# Patient Record
Sex: Male | Born: 1975
Health system: Southern US, Community
[De-identification: ages and names within clinical notes are randomized; demographics above are authoritative.]

## PROBLEM LIST (undated history)

## (undated) DIAGNOSIS — J45909 Unspecified asthma, uncomplicated: Secondary | ICD-10-CM

## (undated) DIAGNOSIS — I700 Atherosclerosis of aorta: Secondary | ICD-10-CM

## (undated) DIAGNOSIS — K760 Fatty (change of) liver, not elsewhere classified: Secondary | ICD-10-CM

## (undated) DIAGNOSIS — I428 Other cardiomyopathies: Secondary | ICD-10-CM

## (undated) DIAGNOSIS — I10 Essential (primary) hypertension: Secondary | ICD-10-CM

## (undated) DIAGNOSIS — I5042 Chronic combined systolic (congestive) and diastolic (congestive) heart failure: Secondary | ICD-10-CM

## (undated) DIAGNOSIS — IMO0001 Reserved for inherently not codable concepts without codable children: Secondary | ICD-10-CM

## (undated) DIAGNOSIS — Z720 Tobacco use: Secondary | ICD-10-CM

---

## 1998-03-16 ENCOUNTER — Emergency Department (HOSPITAL_COMMUNITY): Admission: EM | Admit: 1998-03-16 | Discharge: 1998-03-16 | Payer: Self-pay | Admitting: Emergency Medicine

## 2005-11-16 ENCOUNTER — Encounter: Payer: Self-pay | Admitting: Emergency Medicine

## 2007-08-06 ENCOUNTER — Emergency Department: Payer: Self-pay | Admitting: Emergency Medicine

## 2008-05-02 ENCOUNTER — Emergency Department: Payer: Self-pay | Admitting: Internal Medicine

## 2014-10-08 ENCOUNTER — Emergency Department (HOSPITAL_COMMUNITY)
Admission: EM | Admit: 2014-10-08 | Discharge: 2014-10-08 | Disposition: A | Discharge status: Home / Self Care | Payer: Self-pay | Attending: Emergency Medicine | Admitting: Emergency Medicine

## 2014-10-08 ENCOUNTER — Encounter (HOSPITAL_COMMUNITY): Payer: Self-pay | Admitting: Physical Medicine and Rehabilitation

## 2014-10-08 ENCOUNTER — Emergency Department (HOSPITAL_COMMUNITY): Payer: Self-pay

## 2014-10-08 DIAGNOSIS — S92301A Fracture of unspecified metatarsal bone(s), right foot, initial encounter for closed fracture: Secondary | ICD-10-CM

## 2014-10-08 DIAGNOSIS — Y998 Other external cause status: Secondary | ICD-10-CM | POA: Insufficient documentation

## 2014-10-08 DIAGNOSIS — Y9367 Activity, basketball: Secondary | ICD-10-CM | POA: Insufficient documentation

## 2014-10-08 DIAGNOSIS — S92354A Nondisplaced fracture of fifth metatarsal bone, right foot, initial encounter for closed fracture: Secondary | ICD-10-CM | POA: Insufficient documentation

## 2014-10-08 DIAGNOSIS — X58XXXA Exposure to other specified factors, initial encounter: Secondary | ICD-10-CM | POA: Insufficient documentation

## 2014-10-08 DIAGNOSIS — S9031XA Contusion of right foot, initial encounter: Secondary | ICD-10-CM | POA: Insufficient documentation

## 2014-10-08 DIAGNOSIS — Y92310 Basketball court as the place of occurrence of the external cause: Secondary | ICD-10-CM | POA: Insufficient documentation

## 2014-10-08 DIAGNOSIS — Z720 Tobacco use: Secondary | ICD-10-CM | POA: Insufficient documentation

## 2014-10-08 MED ORDER — NAPROXEN 500 MG PO TABS: 500.0000 mg | ORAL_TABLET | Freq: Two times a day (BID) | ORAL | Status: DC

## 2014-10-08 MED ORDER — TRAMADOL HCL 50 MG PO TABS: 50.0000 mg | ORAL_TABLET | Freq: Four times a day (QID) | ORAL | Status: DC | PRN

## 2014-10-08 MED ORDER — HYDROCODONE-ACETAMINOPHEN 5-325 MG PO TABS
1.0000 | ORAL_TABLET | Freq: Once | ORAL | Status: AC
  Administered 2014-10-08: 1 via ORAL
  Filled 2014-10-08: qty 1

## 2014-10-08 MED ORDER — ONDANSETRON 4 MG PO TBDP
4.0000 mg | ORAL_TABLET | Freq: Once | ORAL | Status: AC
  Administered 2014-10-08: 4 mg via ORAL
  Filled 2014-10-08: qty 1

## 2014-10-08 NOTE — Progress Notes (Signed)
Orthopedic Tech Progress Note Patient Details:  Jesus Fuller 1976/03/01 [id number]  Ortho Devices Type of Ortho Device: Ace wrap, Crutches, Post (short leg) splint Ortho Device/Splint Location: rle Ortho Device/Splint Interventions: Application   [name] 10/08/2014, 12:01 PM

## 2014-10-08 NOTE — ED Notes (Signed)
Pt is in stable condition upon d/c and is escorted from ED by staff via wheelchair. 

## 2014-10-08 NOTE — ED Notes (Signed)
Pt presents to department for evaluation of R foot pain. Reports he twisted foot last night playing basketball. 9/10 pain upon arrival to ED.

## 2014-10-08 NOTE — ED Notes (Signed)
Paged ortho 

## 2014-10-08 NOTE — ED Provider Notes (Signed)
CSN: [id number]     Arrival date & time 10/08/14  1012 History  This chart was scribed for non-physician practitioner, Arthor Captain, PA-C, working with Mancel Bale, MD by Charline Bills, ED Scribe. This patient was seen in room TR08C/TR08C and the patient's care was started at 11:15 AM.   Chief Complaint  Patient presents with  . Foot Pain   The history is provided by the patient. No language interpreter was used.   HPI Comments: Jesus Fuller is a 39 y.o. male who presents to the Emergency Department complaining of constant R foot pain since 7 PM last night. Pt states that he twisted his foot while playing basketball last night with his son. Pain is exacerbated with bearing weight and movement. Pt was able to ambulate a few minutes after the injury with pain. He has been treating with Advil, ice and elevation.   History reviewed. No pertinent past medical history. History reviewed. No pertinent past surgical history. No family history on file. History  Substance Use Topics  . Smoking status: Current Every Day Smoker  . Smokeless tobacco: Not on file  . Alcohol Use: Yes    Review of Systems  Musculoskeletal: Positive for joint swelling and arthralgias.   Allergies  Review of patient's allergies indicates no known allergies.  Home Medications   Prior to Admission medications   Not on File   BP 115/71 mmHg  Pulse 73  Temp(Src) 98.7 F (37.1 C) (Oral)  Resp 18  SpO2 99% Physical Exam  Constitutional: He is oriented to person, place, and time. He appears well-developed and well-nourished. No distress.  HENT:  Head: Normocephalic and atraumatic.  Eyes: Conjunctivae and EOM are normal.  Neck: Neck supple. No tracheal deviation present.  Cardiovascular: Normal rate.   Pulmonary/Chest: Effort normal. No respiratory distress.  Musculoskeletal: Normal range of motion.  No malleolar tenderness. Significant swelling over the proximal and lateral portion of the forefoot.  Signifcant swelling over the fifth metatarsal head. Mild bruising and a blister noted.  Neurological: He is alert and oriented to person, place, and time.  Skin: Skin is warm and dry.  Psychiatric: He has a normal mood and affect. His behavior is normal.  Nursing note and vitals reviewed.  ED Course  Procedures (including critical care time) DIAGNOSTIC STUDIES: Oxygen Saturation is 99% on RA, normal by my interpretation.    COORDINATION OF CARE: 11:20 AM-Discussed treatment plan which includes XR, Vicodin, Zofran and crutches with pt at bedside and pt agreed to plan.   Labs Review Labs Reviewed - No data to display  Imaging Review Dg Foot Complete Right  10/08/2014   CLINICAL DATA:  Patient converted his right foot playing basketball last evening. Right foot lateral pain and swelling.  EXAM: RIGHT FOOT COMPLETE - 3+ VIEW  COMPARISON:  None.  FINDINGS: There is a nondisplaced fracture across the base of the fifth metatarsal. This intersects the cuboid, fifth metatarsal articulation. There is no fracture comminution.  No other fractures. Joints are normally spaced and aligned. Mild lateral soft tissue swelling.  IMPRESSION: Nondisplaced fracture of the base of the right fifth metatarsal as detailed.   Electronically Signed   By: Amie Portland M.D.   On: 10/08/2014 11:24    EKG Interpretation None      SPLINT APPLICATION Date/Time: 1:52 PM Authorized by: Arthor Captain Consent: Verbal consent obtained. Risks and benefits: risks, benefits and alternatives were discussed Consent given by: patient Splint applied by: orthopedic technician Location details: R leg  Splint type: posterior Post-procedure: The splinted body part was neurovascularly unchanged following the procedure. Patient tolerance: Patient tolerated the procedure well with no immediate complications.    MDM   Final diagnoses:  Fracture of fifth metatarsal bone, right, closed, initial encounter    Patient with  proximal 5ht metatarsal fracture . I have placed the patient in a posterior short leg splint. He is to remain nwb until ortho follow up is established. Supportive care RICE  I personally performed the services described in this documentation, which was scribed in my presence. The recorded information has been reviewed and is accurate.      Arthor Captain, PA-C 10/13/14 1354  Mancel Bale, MD 10/16/14 [phone]

## 2014-10-08 NOTE — ED Notes (Signed)
Ortho at bedside.

## 2014-10-08 NOTE — Discharge Instructions (Signed)
Do not bear weight on the foot. Please call to make an appointment for follow up with the orthopedist.  Metatarsal Fracture, Undisplaced A metatarsal fracture is a break in the bone(s) of the foot. These are the bones of the foot that connect your toes to the bones of the ankle. DIAGNOSIS  The diagnoses of these fractures are usually made with X-rays. If there are problems in the forefoot and x-rays are normal a later bone scan will usually make the diagnosis.  TREATMENT AND HOME CARE INSTRUCTIONS  Treatment may or may not include a cast or walking shoe. When casts are needed the use is usually for short periods of time so as not to slow down healing with muscle wasting (atrophy).  Activities should be stopped until further advised by your caregiver.  Wear shoes with adequate shock absorbing capabilities and stiff soles.  Alternative exercise may be undertaken while waiting for healing. These may include bicycling and swimming, or as your caregiver suggests.  It is important to keep all follow-up visits or specialty referrals. The failure to keep these appointments could result in improper bone healing and chronic pain or disability.  Warning: Do not drive a car or operate a motor vehicle until your caregiver specifically tells you it is safe to do so. IF YOU DO NOT HAVE A CAST OR SPLINT:  You may walk on your injured foot as tolerated or advised.  Do not put any weight on your injured foot for as long as directed by your caregiver. Slowly increase the amount of time you walk on the foot as the pain allows or as advised.  Use crutches until you can bear weight without pain. A gradual increase in weight bearing may help.  Apply ice to the injury for 15-20 minutes each hour while awake for the first 2 days. Put the ice in a plastic bag and place a towel between the bag of ice and your skin.  Only take over-the-counter or prescription medicines for pain, discomfort, or fever as directed by  your caregiver. SEEK IMMEDIATE MEDICAL CARE IF:   Your cast gets damaged or breaks.  You have continued severe pain or more swelling than you did before the cast was put on, or the pain is not controlled with medications.  Your skin or nails below the injury turn blue or grey, or feel cold or numb.  There is a bad smell, or new stains or pus-like (purulent) drainage coming from the cast. MAKE SURE YOU:   Understand these instructions.  Will watch your condition.  Will get help right away if you are not doing well or get worse. Document Released: 03/26/2002 Document Revised: 09/26/2011 Document Reviewed: 02/15/2008 [facility] Patient Information 2015 [place], [place]. This information is not intended to replace advice given to you by your health care provider. Make sure you discuss any questions you have with your health care provider.

## 2017-10-18 ENCOUNTER — Encounter: Payer: Self-pay | Admitting: Emergency Medicine

## 2017-10-18 ENCOUNTER — Emergency Department: Payer: Self-pay

## 2017-10-18 ENCOUNTER — Other Ambulatory Visit: Payer: Self-pay

## 2017-10-18 ENCOUNTER — Emergency Department
Admission: EM | Admit: 2017-10-18 | Discharge: 2017-10-18 | Disposition: A | Discharge status: Home / Self Care | Payer: Self-pay | Attending: Emergency Medicine | Admitting: Emergency Medicine

## 2017-10-18 DIAGNOSIS — Z23 Encounter for immunization: Secondary | ICD-10-CM | POA: Insufficient documentation

## 2017-10-18 DIAGNOSIS — T24021A Burn of unspecified degree of right knee, initial encounter: Secondary | ICD-10-CM | POA: Insufficient documentation

## 2017-10-18 DIAGNOSIS — S81011A Laceration without foreign body, right knee, initial encounter: Secondary | ICD-10-CM | POA: Insufficient documentation

## 2017-10-18 DIAGNOSIS — X58XXXA Exposure to other specified factors, initial encounter: Secondary | ICD-10-CM | POA: Insufficient documentation

## 2017-10-18 DIAGNOSIS — Y999 Unspecified external cause status: Secondary | ICD-10-CM | POA: Insufficient documentation

## 2017-10-18 DIAGNOSIS — Z79899 Other long term (current) drug therapy: Secondary | ICD-10-CM | POA: Insufficient documentation

## 2017-10-18 DIAGNOSIS — Y939 Activity, unspecified: Secondary | ICD-10-CM | POA: Insufficient documentation

## 2017-10-18 DIAGNOSIS — Y929 Unspecified place or not applicable: Secondary | ICD-10-CM | POA: Insufficient documentation

## 2017-10-18 DIAGNOSIS — F17210 Nicotine dependence, cigarettes, uncomplicated: Secondary | ICD-10-CM | POA: Insufficient documentation

## 2017-10-18 DIAGNOSIS — W268XXA Contact with other sharp object(s), not elsewhere classified, initial encounter: Secondary | ICD-10-CM | POA: Insufficient documentation

## 2017-10-18 MED ORDER — TETANUS-DIPHTH-ACELL PERTUSSIS 5-2.5-18.5 LF-MCG/0.5 IM SUSP
0.5000 mL | Freq: Once | INTRAMUSCULAR | Status: AC
  Administered 2017-10-18: 0.5 mL via INTRAMUSCULAR
  Filled 2017-10-18: qty 0.5

## 2017-10-18 MED ORDER — CEPHALEXIN 500 MG PO CAPS: 500.0000 mg | ORAL_CAPSULE | Freq: Four times a day (QID) | ORAL | 0 refills | Status: AC

## 2017-10-18 MED ORDER — LIDOCAINE HCL (PF) 1 % IJ SOLN
5.0000 mL | Freq: Once | INTRAMUSCULAR | Status: AC
  Administered 2017-10-18: 5 mL via INTRADERMAL
  Filled 2017-10-18: qty 5

## 2017-10-18 MED ORDER — LIDOCAINE-EPINEPHRINE-TETRACAINE (LET) SOLUTION
NASAL | Status: AC
  Filled 2017-10-18: qty 3

## 2017-10-18 MED ORDER — LIDOCAINE-EPINEPHRINE-TETRACAINE (LET) SOLUTION
3.0000 mL | Freq: Once | NASAL | Status: AC
  Administered 2017-10-18: 15:00:00 3 mL via TOPICAL
  Filled 2017-10-18: qty 3

## 2017-10-18 MED ORDER — IBUPROFEN 600 MG PO TABS: 600.0000 mg | ORAL_TABLET | Freq: Four times a day (QID) | ORAL | 0 refills | Status: DC | PRN

## 2017-10-18 MED ORDER — OXYCODONE-ACETAMINOPHEN 5-325 MG PO TABS: 1.0000 | ORAL_TABLET | ORAL | 0 refills | Status: DC | PRN

## 2017-10-18 MED ORDER — LIDOCAINE HCL (PF) 1 % IJ SOLN
INTRAMUSCULAR | Status: AC
  Filled 2017-10-18: qty 5
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
## 2017-10-18 NOTE — ED Notes (Signed)
Wound dressed by rn

## 2017-11-02 ENCOUNTER — Other Ambulatory Visit: Payer: Self-pay

## 2017-11-02 ENCOUNTER — Emergency Department
Admission: EM | Admit: 2017-11-02 | Discharge: 2017-11-02 | Disposition: A | Discharge status: Home / Self Care | Payer: Self-pay | Attending: Emergency Medicine | Admitting: Emergency Medicine

## 2017-11-02 DIAGNOSIS — F17200 Nicotine dependence, unspecified, uncomplicated: Secondary | ICD-10-CM | POA: Insufficient documentation

## 2017-11-02 DIAGNOSIS — X58XXXD Exposure to other specified factors, subsequent encounter: Secondary | ICD-10-CM | POA: Insufficient documentation

## 2017-11-02 DIAGNOSIS — S8991XD Unspecified injury of right lower leg, subsequent encounter: Secondary | ICD-10-CM | POA: Insufficient documentation

## 2017-11-02 DIAGNOSIS — Z4802 Encounter for removal of sutures: Secondary | ICD-10-CM | POA: Insufficient documentation

## 2017-11-02 NOTE — Discharge Instructions (Signed)
Clean area twice a day with mild soap and water.  Keep area clean and dry.  Do not apply any gel or ointment to the area.  Follow-up with [facility] acute care if any continued problems.

## 2017-11-02 NOTE — ED Provider Notes (Signed)
[facility] Emergency Department Provider Note  ____________________________________________   First MD Initiated Contact with Patient 11/02/17 1218     (approximate)  I have reviewed the triage vital signs and the nursing notes.   HISTORY  Chief Complaint Suture / Staple Removal  HPI Jesus Fuller is a 42 y.o. male is here for suture removal.  Patient was seen in the ED 15 days ago at which time his knee was sutured.  Patient states he was not told when to come back.  He has been using Neosporin to the area.  He is unaware of any fever or chills.  He has had no drainage.  History reviewed. No pertinent past medical history.  There are no active problems to display for this patient.   History reviewed. No pertinent surgical history.  Prior to Admission medications   Not on File    Allergies Patient has no known allergies.  History reviewed. No pertinent family history.  Social History Social History   Tobacco Use  . Smoking status: Current Every Day Smoker  . Smokeless tobacco: Never Used  Substance Use Topics  . Alcohol use: Yes  . Drug use: No    Review of Systems Constitutional: No fever/chills Cardiovascular: Denies chest pain. Respiratory: Denies shortness of breath. Skin: Healing laceration. Neurological: Negative for  focal weakness or numbness. ___________________________________________   PHYSICAL EXAM:  VITAL SIGNS: ED Triage Vitals  Enc Vitals Group     BP      Pulse      Resp      Temp      Temp src      SpO2      Weight      Height      Head Circumference      Peak Flow      Pain Score      Pain Loc      Pain Edu?      Excl. in GC?    Constitutional: Alert and oriented. Well appearing and in no acute distress. Eyes: Conjunctivae are normal.  Head: Atraumatic. Neck: No stridor.   Cardiovascular: Normal rate, regular rhythm. Grossly normal heart sounds.  Good peripheral circulation. Respiratory:  Normal respiratory effort.  No retractions. Lungs CTAB. Musculoskeletal: Right knee exam is negative for gross deformity.  There is a well-healed sutured area without any drainage. Neurologic:  Normal speech and language. No gross focal neurologic deficits are appreciated. No gait instability. Skin:  Skin is warm, dry and intact.  As above. Psychiatric: Mood and affect are normal. Speech and behavior are normal.  ____________________________________________   LABS (all labs ordered are listed, but only abnormal results are displayed)  Labs Reviewed - No data to display  PROCEDURES  Procedure(s) performed: None  Procedures  Critical Care performed: No  ____________________________________________   INITIAL IMPRESSION / ASSESSMENT AND PLAN / ED COURSE  As part of my medical decision making, I reviewed the following data within the electronic MEDICAL RECORD NUMBER Notes from prior ED visits and [place] Controlled Substance Database  Sutures were removed.  Patient was told to keep area clean and dry and watch for any signs of infection.  He is to follow-up with his PCP or [facility] acute care if any continued problems.  ____________________________________________   FINAL CLINICAL IMPRESSION(S) / ED DIAGNOSES  Final diagnoses:  Encounter for removal of sutures     ED Discharge Orders    None       Note:  This document was prepared using Dragon voice recognition software and may include unintentional dictation errors.    Tommi Rumps, PA-C 11/02/17 1504    Emily Filbert, MD 11/02/17 [phone]
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
## 2017-11-02 NOTE — ED Notes (Signed)
7 sutures removed from right leg.

## 2018-10-01 ENCOUNTER — Emergency Department (HOSPITAL_COMMUNITY): Payer: Self-pay

## 2018-10-01 ENCOUNTER — Encounter (HOSPITAL_COMMUNITY): Payer: Self-pay

## 2018-10-01 ENCOUNTER — Other Ambulatory Visit: Payer: Self-pay

## 2018-10-01 ENCOUNTER — Emergency Department (HOSPITAL_COMMUNITY)
Admission: EM | Admit: 2018-10-01 | Discharge: 2018-10-01 | Disposition: A | Discharge status: Home / Self Care | Payer: Self-pay | Attending: Emergency Medicine | Admitting: Emergency Medicine

## 2018-10-01 DIAGNOSIS — F17210 Nicotine dependence, cigarettes, uncomplicated: Secondary | ICD-10-CM | POA: Insufficient documentation

## 2018-10-01 DIAGNOSIS — J45909 Unspecified asthma, uncomplicated: Secondary | ICD-10-CM | POA: Insufficient documentation

## 2018-10-01 DIAGNOSIS — M545 Low back pain, unspecified: Secondary | ICD-10-CM

## 2018-10-01 HISTORY — DX: Unspecified asthma, uncomplicated: J45.909

## 2018-10-01 MED ORDER — KETOROLAC TROMETHAMINE 15 MG/ML IJ SOLN
15.0000 mg | Freq: Once | INTRAMUSCULAR | Status: AC
  Administered 2018-10-01: 15 mg via INTRAMUSCULAR
  Filled 2018-10-01: qty 1

## 2018-10-01 MED ORDER — CYCLOBENZAPRINE HCL 10 MG PO TABS
10.0000 mg | ORAL_TABLET | Freq: Once | ORAL | Status: AC
  Administered 2018-10-01: 10 mg via ORAL
  Filled 2018-10-01: qty 1

## 2018-10-01 MED ORDER — NAPROXEN 500 MG PO TABS: 500.0000 mg | ORAL_TABLET | Freq: Two times a day (BID) | ORAL | 0 refills | Status: DC

## 2018-10-01 MED ORDER — CYCLOBENZAPRINE HCL 10 MG PO TABS: 10.0000 mg | ORAL_TABLET | Freq: Two times a day (BID) | ORAL | 0 refills | Status: DC | PRN

## 2018-10-01 NOTE — ED Provider Notes (Signed)
[facility]-EMERGENCY DEPT Provider Note   CSN: [id number] Arrival date & time: 10/01/18  3976    History   Chief Complaint Chief Complaint  Patient presents with  . Back Pain    HPI Jesus Fuller is a 43 y.o. male.     Patient is a 43 year old male with past medical history of asthma presenting to the emergency department for lower back pain.  Patient reports that this past Friday he was lifting heavy things at work and began to feel the back pain while he was lifting.  Reports that the pain gradually got worse over the next couple of days.  Reports that the pain is in the left lower back and radiates to the upper lateral left thigh.  Denies any radiation further into the legs.  Pain is worse with prolonged standing, walking and turning. Denies any numbness, tingling, weakness, saddle anesthesia, fever, loss of control of bowel or bladder.  He has not tried anything for relief.  He has had a history of some back spasms and pulled muscles in the past and feels like this is similar.     Past Medical History:  Diagnosis Date  . Asthma     There are no active problems to display for this patient.   History reviewed. No pertinent surgical history.      Home Medications    Prior to Admission medications   Medication Sig Start Date End Date Taking? Authorizing Provider  cyclobenzaprine (FLEXERIL) 10 MG tablet Take 1 tablet (10 mg total) by mouth 2 (two) times daily as needed for muscle spasms. 10/01/18   Arlyn Dunning, PA-C  naproxen (NAPROSYN) 500 MG tablet Take 1 tablet (500 mg total) by mouth 2 (two) times daily. 10/01/18   Arlyn Dunning, PA-C    Family History Family History  Problem Relation Age of Onset  . Cancer Mother   . COPD Mother   . Diabetes Father     Social History Social History   Tobacco Use  . Smoking status: Current Every Day Smoker    Packs/day: 1.50    Types: Cigarettes  . Smokeless tobacco: Never Used  Substance Use  Topics  . Alcohol use: Yes  . Drug use: No     Allergies   Patient has no known allergies.   Review of Systems Review of Systems  Constitutional: Negative for chills and fever.  HENT: Negative for ear pain and sore throat.   Eyes: Negative for pain and visual disturbance.  Respiratory: Negative for cough and shortness of breath.   Cardiovascular: Negative for chest pain and palpitations.  Gastrointestinal: Negative for abdominal pain and vomiting.  Genitourinary: Negative for difficulty urinating, dysuria and hematuria.  Musculoskeletal: Positive for arthralgias and myalgias. Negative for back pain, gait problem, joint swelling, neck pain and neck stiffness.  Skin: Negative for color change and rash.  Neurological: Negative for dizziness, seizures, syncope and numbness.  All other systems reviewed and are negative.    Physical Exam Updated Vital Signs BP (!) 162/113 (BP Location: Left Arm)   Pulse 78   Temp 98.4 F (36.9 C)   Resp 17   Ht 5\' 9"  (1.753 m)   Wt 88.5 kg   SpO2 98%   BMI 28.80 kg/m   Physical Exam Vitals signs and nursing note reviewed.  Constitutional:      Appearance: Normal appearance.  HENT:     Head: Normocephalic.     Nose: Nose normal. No congestion or  rhinorrhea.     Mouth/Throat:     Mouth: Mucous membranes are moist.  Eyes:     Conjunctiva/sclera: Conjunctivae normal.  Pulmonary:     Effort: Pulmonary effort is normal.  Musculoskeletal:     Thoracic back: Normal.     Lumbar back: He exhibits tenderness, pain and spasm. He exhibits normal range of motion, no bony tenderness, no swelling, no deformity and no laceration.       Back:     Left upper leg: Normal.  Skin:    General: Skin is dry.  Neurological:     General: No focal deficit present.     Mental Status: He is alert.     Cranial Nerves: No cranial nerve deficit.     Motor: No weakness.     Gait: Gait abnormal (stiff).  Psychiatric:        Mood and Affect: Mood normal.       ED Treatments / Results  Labs (all labs ordered are listed, but only abnormal results are displayed) Labs Reviewed - No data to display  EKG None  Radiology Dg Lumbar Spine 2-3 Views  Result Date: 10/01/2018 CLINICAL DATA:  Left-sided lower back pain EXAM: LUMBAR SPINE - 2-3 VIEW COMPARISON:  02/20/2016 FINDINGS: No fracture or dislocation of the lumbar spine. There is mild disc space height loss and osteophytosis at L1-L2, L4-L5 and L5-S1. Mild multilevel facet degenerative change. Findings are not significantly changed compared to prior examination dated 02/20/2016. Lumbar disc and neural foraminal pathology may be further evaluated by MRI if indicated by localizing signs and symptoms. IMPRESSION: No fracture or dislocation of the lumbar spine. There is mild disc space height loss and osteophytosis at L1-L2, L4-L5 and L5-S1. Mild multilevel facet degenerative change. Findings are not significantly changed compared to prior examination dated 02/20/2016. Lumbar disc and neural foraminal pathology may be further evaluated by MRI if indicated by localizing signs and symptoms. Electronically Signed   By: Lauralyn Primes M.D.   On: 10/01/2018 09:29    Procedures Procedures (including critical care time)  Medications Ordered in ED Medications  cyclobenzaprine (FLEXERIL) tablet 10 mg (10 mg Oral Given 10/01/18 0903)  ketorolac (TORADOL) 15 MG/ML injection 15 mg (15 mg Intramuscular Given 10/01/18 0904)     Initial Impression / Assessment and Plan / ED Course  I have reviewed the triage vital signs and the nursing notes.  Pertinent labs & imaging results that were available during my care of the patient were reviewed by me and considered in my medical decision making (see chart for details).  Clinical Course as of Sep 30 956  Mon Oct 01, 2018  6568 Patient was evaluated for back pain today. Patient has no concerning symptoms or physical exam findings including no fever, no loss of control  of bowel or bladder, no urinary retention, no saddle anesthesia, no leg weakness and no pain radiation into the legs. She was given medication to treat her symptoms and advised to f/u with PMD for further workup including possible PT, medication change, further imaging, etc. She was advised on all concerning symptoms above and to return to the ED if any of them arise.       [KM]    Clinical Course User Index [KM] Arlyn Dunning, PA-C         Final Clinical Impressions(s) / ED Diagnoses   Final diagnoses:  Acute left-sided low back pain without sciatica    ED Discharge Orders  Ordered    cyclobenzaprine (FLEXERIL) 10 MG tablet  2 times daily PRN     10/01/18 0957    naproxen (NAPROSYN) 500 MG tablet  2 times daily     10/01/18 0957           Arlyn Dunning, PA-C 10/01/18 6301    Derwood Kaplan, MD 10/03/18 1216

## 2018-10-01 NOTE — ED Triage Notes (Signed)
Patient reports left lower back pain that radiates into t he left buttock x 3 days. Worse today. 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 Patient denies any numbness.

## 2018-10-01 NOTE — Discharge Instructions (Signed)
Thank you for allowing me to care for you today. Please return to the emergency department if you have new or worsening symptoms. Take your medications as instructed.  ° °

## 2018-10-17 DIAGNOSIS — R079 Chest pain, unspecified: Secondary | ICD-10-CM

## 2018-10-17 HISTORY — DX: Chest pain, unspecified: R07.9

## 2018-10-18 ENCOUNTER — Encounter (HOSPITAL_COMMUNITY): Payer: Self-pay

## 2018-10-18 ENCOUNTER — Other Ambulatory Visit: Payer: Self-pay

## 2018-10-18 ENCOUNTER — Emergency Department (HOSPITAL_COMMUNITY)
Admission: EM | Admit: 2018-10-18 | Discharge: 2018-10-18 | Disposition: A | Discharge status: Home / Self Care | Payer: Self-pay | Attending: Emergency Medicine | Admitting: Emergency Medicine

## 2018-10-18 DIAGNOSIS — J45909 Unspecified asthma, uncomplicated: Secondary | ICD-10-CM | POA: Insufficient documentation

## 2018-10-18 DIAGNOSIS — M545 Low back pain, unspecified: Secondary | ICD-10-CM

## 2018-10-18 DIAGNOSIS — I10 Essential (primary) hypertension: Secondary | ICD-10-CM | POA: Insufficient documentation

## 2018-10-18 DIAGNOSIS — F17210 Nicotine dependence, cigarettes, uncomplicated: Secondary | ICD-10-CM | POA: Insufficient documentation

## 2018-10-18 HISTORY — DX: Essential (primary) hypertension: I10

## 2018-10-18 LAB — I-STAT CREATININE, ED: Creatinine, Ser: 1.1 mg/dL (ref 0.61–1.24)

## 2018-10-18 MED ORDER — HYDROCHLOROTHIAZIDE 12.5 MG PO CAPS
12.5000 mg | ORAL_CAPSULE | Freq: Once | ORAL | Status: AC
  Administered 2018-10-18: 12.5 mg via ORAL
  Filled 2018-10-18: qty 1

## 2018-10-18 MED ORDER — HYDROCHLOROTHIAZIDE 12.5 MG PO TABS: 12.5000 mg | ORAL_TABLET | Freq: Every day | ORAL | 0 refills | Status: DC

## 2018-10-18 MED ORDER — METHYLPREDNISOLONE 4 MG PO TBPK: ORAL_TABLET | ORAL | 0 refills | Status: DC

## 2018-10-18 MED ORDER — CYCLOBENZAPRINE HCL 10 MG PO TABS: 10.0000 mg | ORAL_TABLET | Freq: Two times a day (BID) | ORAL | 0 refills | Status: DC | PRN

## 2018-10-18 NOTE — ED Triage Notes (Signed)
Pt c/o LL Back pain since last week while working on a [employer] site. Denies injury or urinary symptoms.

## 2018-10-18 NOTE — Discharge Instructions (Signed)
1. Medications: Take steroid taper as prescribed with food to avoid upset stomach issues.  Do not take ibuprofen, Advil, Aleve, aspirin or Motrin while taking this medicine.  You may take [phone] mg of Tylenol every 6 hours as needed for pain. Do not exceed 4000 mg of Tylenol daily.  You can take Flexeril as needed for muscle relaxation but this medication may make you drowsy so do not drive, drink alcohol, operate heavy machinery, or make important decisions while you are using this medicine.  I typically recommend only taking this medicine at night.  You can also cut these tablets in half if they are very strong. 2. Treatment: rest, apply heat 3-4 times daily for 20 minutes at a time, hot showers/baths, drink plenty of fluids, gentle stretching frequently (see attached exercises, but you can also YouTube search lumbar/low back physical therapy exercises).   3. Follow Up: Please follow-up with orthopedics as directed or your PCP in 1 week if no improvement for discussion of your diagnoses and further evaluation after today's visit; if you do not have a primary care doctor use the resource guide provided to find one (call [place] [facility] to establish primary care); Please return to the ER for worsening symptoms or other concerns such as worsening swelling, redness of the skin, fevers, loss of pulses, or loss of feeling  If your blood pressure (BP) was elevated on multiple readings during this visit above 130 for the top number or above 80 for the bottom number, please have this repeated by your primary care provider within one month. You can also check your blood pressure when you are out at a pharmacy or grocery store. Many have machines that will check your blood pressure.  If your blood pressure remains elevated, please follow-up with your PCP.

## 2018-10-18 NOTE — ED Provider Notes (Signed)
[facility]   CSN: [id number] Arrival date & time: 10/18/18  4656    History   Chief Complaint Chief Complaint  Patient presents with  . Back Pain    LL    HPI Jesus Fuller is a 43 y.o. male with history of asthma and hypertension presents for evaluation of ongoing, intermittent left lower back pain for 2-3 weeks.  Reports pain began after some heavy lifting at work.  Pain is intermittent.  He is unsure what worsens his symptoms.  No radiation of pain down the lower extremities, numbness, or weakness.  Seen and evaluated in the ED for the same on 10/01/2018.  He reports that the Flexeril was helpful but the naproxen was not.  Has been taking approximately 6 tablets of aspirin daily with some relief in his symptoms.  Denies abdominal pain, melanotic stools, or hematemesis.  Denies fevers, IV drug use, bowel or bladder incontinence, saddle anesthesia, or history of cancer.  He does do a lot of heavy lifting at work.  The current smoker.     The history is provided by the patient.    Past Medical History:  Diagnosis Date  . Asthma   . Hypertension    Takes no medicine    There are no active problems to display for this patient.   History reviewed. No pertinent surgical history.      Home Medications    Prior to Admission medications   Medication Sig Start Date End Date Taking? Authorizing Provider  cyclobenzaprine (FLEXERIL) 10 MG tablet Take 1 tablet (10 mg total) by mouth 2 (two) times daily as needed for muscle spasms. 10/18/18   Luevenia Maxin, Rosmery Duggin A, PA-C  hydrochlorothiazide (HYDRODIURIL) 12.5 MG tablet Take 1 tablet (12.5 mg total) by mouth daily. 10/18/18   Luevenia Maxin, Lynley Killilea A, PA-C  methylPREDNISolone (MEDROL DOSEPAK) 4 MG TBPK tablet Follow instruction on packaging 10/18/18   Michela Pitcher A, PA-C  naproxen (NAPROSYN) 500 MG tablet Take 1 tablet (500 mg total) by mouth 2 (two) times daily. 10/01/18   Arlyn Dunning, PA-C    Family  History Family History  Problem Relation Age of Onset  . Cancer Mother   . COPD Mother   . Diabetes Father     Social History Social History   Tobacco Use  . Smoking status: Current Every Day Smoker    Packs/day: 1.50    Types: Cigarettes  . Smokeless tobacco: Never Used  Substance Use Topics  . Alcohol use: Yes    Comment: occasional  . Drug use: No     Allergies   Patient has no known allergies.   Review of Systems Review of Systems  Constitutional: Negative for fever.  Gastrointestinal: Negative for abdominal pain, nausea and vomiting.  Musculoskeletal: Positive for back pain.  Neurological: Negative for weakness and numbness.     Physical Exam Updated Vital Signs BP (!) 146/90   Pulse (!) 101   Temp 98.2 F (36.8 C) (Oral)   Resp 18   Ht 5\' 9"  (1.753 m)   Wt 88 kg   SpO2 98%   BMI 28.65 kg/m   Physical Exam Vitals signs and nursing note reviewed.  Constitutional:      General: He is not in acute distress.    Appearance: He is well-developed.  HENT:     Head: Normocephalic and atraumatic.  Eyes:     General:        Right eye: No discharge.  Left eye: No discharge.     Conjunctiva/sclera: Conjunctivae normal.  Neck:     Vascular: No JVD.     Trachea: No tracheal deviation.  Cardiovascular:     Rate and Rhythm: Normal rate.     Pulses: Normal pulses.     Comments: 2+ radial and DP/PT pulses bilaterally, no lower extremity edema, no palpable cords, compartments are soft  Pulmonary:     Effort: Pulmonary effort is normal.  Abdominal:     General: There is no distension.  Musculoskeletal:     Lumbar back: He exhibits pain and spasm. He exhibits normal range of motion.     Comments: No midline lumbar spine TTP, no paraspinal muscle tenderness, no deformity, crepitus, or step-off noted.  Left lumbar pain elicited with straight leg raise of the left lower extremity and flexion/extension of the lumbar spine.  5/5 strength of BLE major muscle  groups  Skin:    General: Skin is warm and dry.     Findings: No erythema.  Neurological:     General: No focal deficit present.     Mental Status: He is alert.     Gait: Gait normal.     Comments: Fluent speech, no facial droop, sensation intact to soft touch of bilateral lower extremities.  Patient able to heel walk and toe walk without difficulty but ambulates with an antalgic gait.  Exhibits good balance.  Psychiatric:        Behavior: Behavior normal.      ED Treatments / Results  Labs (all labs ordered are listed, but only abnormal results are displayed) Labs Reviewed  I-STAT CREATININE, ED    EKG None  Radiology No results found.  Procedures Procedures (including critical care time)  Medications Ordered in ED Medications  hydrochlorothiazide (MICROZIDE) capsule 12.5 mg (12.5 mg Oral Given 10/18/18 1043)     Initial Impression / Assessment and Plan / ED Course  I have reviewed the triage vital signs and the nursing notes.  Pertinent labs & imaging results that were available during my care of the patient were reviewed by me and considered in my medical decision making (see chart for details).        Patient presenting with ongoing back pain after heavy lifting at work a few weeks ago.  He is afebrile, hypertensive in the ED with improvement on re-evaluation.  Appears comfortable, initially sleeping on my assessment but easily arousable.  Reports a history of hypertension but not currently on any medications.  I-STAT Chem-8 obtained, within normal limits.  He expresses interest in starting a blood pressure medication, will discharge with HCTZ.  With regards to his back pain, he is neurovascularly intact, no focal neurological deficits.  He is ambulatory without difficulty.  No midline spine tenderness.  No red flag signs concerning for cauda equina or spinal abscess.  He did have some improvement with Flexeril.  Will discharge with steroid taper, advised to avoid  NSAIDs and aspirin while taking this medication.  Also discussed sedating side effects of Flexeril and cautioned to avoid driving, drinking alcohol, or operating heavy machinery.  Also discussed conservative therapy management with heat therapy, stretching, Tylenol.  Recommend follow-up with PCP for reevaluation of his hypertension and back pain.  Discussed strict ED return precautions. Pt verbalized understanding of and agreement with plan and is safe for discharge home at this time.   Final Clinical Impressions(s) / ED Diagnoses   Final diagnoses:  Acute left-sided low back pain without sciatica  Hypertension, unspecified type    ED Discharge Orders         Ordered    hydrochlorothiazide (HYDRODIURIL) 12.5 MG tablet  Daily     10/18/18 1026    cyclobenzaprine (FLEXERIL) 10 MG tablet  2 times daily PRN     10/18/18 1026    methylPREDNISolone (MEDROL DOSEPAK) 4 MG TBPK tablet     10/18/18 1026           Merit Maybee, [place] A, PA-C 10/18/18 1050    Lorre Nick, MD 10/22/18 1446

## 2018-10-23 ENCOUNTER — Encounter (HOSPITAL_COMMUNITY): Payer: Self-pay

## 2018-10-23 ENCOUNTER — Inpatient Hospital Stay (HOSPITAL_COMMUNITY)
Admission: EM | Admit: 2018-10-23 | Discharge: 2018-10-25 | DRG: 287 | Disposition: A | Discharge status: Home / Self Care | Payer: Self-pay | Attending: Cardiovascular Disease | Admitting: Cardiovascular Disease

## 2018-10-23 ENCOUNTER — Other Ambulatory Visit: Payer: Self-pay

## 2018-10-23 ENCOUNTER — Emergency Department (HOSPITAL_COMMUNITY): Payer: Self-pay

## 2018-10-23 DIAGNOSIS — I10 Essential (primary) hypertension: Secondary | ICD-10-CM | POA: Diagnosis present

## 2018-10-23 DIAGNOSIS — I214 Non-ST elevation (NSTEMI) myocardial infarction: Secondary | ICD-10-CM

## 2018-10-23 DIAGNOSIS — F17210 Nicotine dependence, cigarettes, uncomplicated: Secondary | ICD-10-CM | POA: Diagnosis present

## 2018-10-23 DIAGNOSIS — I428 Other cardiomyopathies: Principal | ICD-10-CM | POA: Diagnosis present

## 2018-10-23 DIAGNOSIS — R739 Hyperglycemia, unspecified: Secondary | ICD-10-CM | POA: Diagnosis present

## 2018-10-23 DIAGNOSIS — J45909 Unspecified asthma, uncomplicated: Secondary | ICD-10-CM | POA: Diagnosis present

## 2018-10-23 DIAGNOSIS — F411 Generalized anxiety disorder: Secondary | ICD-10-CM | POA: Diagnosis present

## 2018-10-23 DIAGNOSIS — R110 Nausea: Secondary | ICD-10-CM | POA: Diagnosis present

## 2018-10-23 DIAGNOSIS — R55 Syncope and collapse: Secondary | ICD-10-CM | POA: Diagnosis present

## 2018-10-23 DIAGNOSIS — Z825 Family history of asthma and other chronic lower respiratory diseases: Secondary | ICD-10-CM

## 2018-10-23 DIAGNOSIS — I249 Acute ischemic heart disease, unspecified: Secondary | ICD-10-CM | POA: Diagnosis present

## 2018-10-23 DIAGNOSIS — Z833 Family history of diabetes mellitus: Secondary | ICD-10-CM

## 2018-10-23 DIAGNOSIS — R072 Precordial pain: Secondary | ICD-10-CM | POA: Diagnosis present

## 2018-10-23 LAB — BASIC METABOLIC PANEL
Anion gap: 15 (ref 5–15)
BUN: 21 mg/dL — ABNORMAL HIGH (ref 6–20)
CO2: 26 mmol/L (ref 22–32)
Calcium: 10.9 mg/dL — ABNORMAL HIGH (ref 8.9–10.3)
Chloride: 96 mmol/L — ABNORMAL LOW (ref 98–111)
Creatinine, Ser: 1.4 mg/dL — ABNORMAL HIGH (ref 0.61–1.24)
GFR calc Af Amer: >60 mL/min (ref >60)
GFR calc non Af Amer: >60 mL/min (ref >60)
Glucose, Bld: 119 mg/dL — ABNORMAL HIGH (ref 70–99)
Potassium: 3.5 mmol/L (ref 3.5–5.1)
Sodium: 137 mmol/L (ref 135–145)

## 2018-10-23 LAB — APTT: aPTT: 26 seconds (ref 24–36)

## 2018-10-23 LAB — D-DIMER, QUANTITATIVE: D-Dimer, Quant: <0.27 ug/mL-FEU (ref 0.00–0.50)

## 2018-10-23 LAB — CBC
HCT: 49.9 % (ref 39.0–52.0)
Hemoglobin: 18.2 g/dL — ABNORMAL HIGH (ref 13.0–17.0)
MCH: 37.6 pg — ABNORMAL HIGH (ref 26.0–34.0)
MCHC: 36.5 g/dL — ABNORMAL HIGH (ref 30.0–36.0)
MCV: 103.1 fL — ABNORMAL HIGH (ref 80.0–100.0)
Platelets: 257 10*3/uL (ref 150–400)
RBC: 4.84 MIL/uL (ref 4.22–5.81)
RDW: 14.8 % (ref 11.5–15.5)
WBC: 13.7 10*3/uL — ABNORMAL HIGH (ref 4.0–10.5)
nRBC: 0 % (ref 0.0–0.2)

## 2018-10-23 LAB — TROPONIN I
Troponin I: 0.04 ng/mL (ref <0.03)
Troponin I: 0.03 ng/mL (ref <0.03)

## 2018-10-23 LAB — HEPATIC FUNCTION PANEL
ALT: 23 U/L (ref 0–44)
AST: 26 U/L (ref 15–41)
Albumin: 4.6 g/dL (ref 3.5–5.0)
Alkaline Phosphatase: 81 U/L (ref 38–126)
Bilirubin, Direct: 0.4 mg/dL — ABNORMAL HIGH (ref 0.0–0.2)
Indirect Bilirubin: 1.3 mg/dL — ABNORMAL HIGH (ref 0.3–0.9)
Total Bilirubin: 1.7 mg/dL — ABNORMAL HIGH (ref 0.3–1.2)
Total Protein: 7.9 g/dL (ref 6.5–8.1)

## 2018-10-23 LAB — LIPASE, BLOOD: Lipase: 21 U/L (ref 11–51)

## 2018-10-23 LAB — PROTIME-INR
INR: 1 (ref 0.8–1.2)
Prothrombin Time: 13.4 seconds (ref 11.4–15.2)

## 2018-10-23 MED ORDER — ONDANSETRON HCL 4 MG/2ML IJ SOLN: 4.0000 mg | Freq: Four times a day (QID) | INTRAMUSCULAR | Status: DC | PRN

## 2018-10-23 MED ORDER — ATORVASTATIN CALCIUM 40 MG PO TABS
40.0000 mg | ORAL_TABLET | Freq: Every day | ORAL | Status: DC
  Administered 2018-10-24: 40 mg via ORAL
  Filled 2018-10-23: qty 1

## 2018-10-23 MED ORDER — HEPARIN BOLUS VIA INFUSION
4000.0000 [IU] | Freq: Once | INTRAVENOUS | Status: AC
  Administered 2018-10-23: 4000 [IU] via INTRAVENOUS
  Filled 2018-10-23: qty 4000

## 2018-10-23 MED ORDER — SODIUM CHLORIDE 0.9% FLUSH: 3.0000 mL | INTRAVENOUS | Status: DC | PRN

## 2018-10-23 MED ORDER — METOPROLOL TARTRATE 25 MG PO TABS
25.0000 mg | ORAL_TABLET | Freq: Two times a day (BID) | ORAL | Status: DC
  Administered 2018-10-23 – 2018-10-25 (×4): 25 mg via ORAL
  Filled 2018-10-23 (×4): qty 1

## 2018-10-23 MED ORDER — HEPARIN (PORCINE) 25000 UT/250ML-% IV SOLN
1600.0000 [IU]/h | INTRAVENOUS | Status: DC
  Administered 2018-10-23: 1200 [IU]/h via INTRAVENOUS
  Filled 2018-10-23 (×2): qty 250

## 2018-10-23 MED ORDER — LORAZEPAM 0.5 MG PO TABS
0.5000 mg | ORAL_TABLET | Freq: Every day | ORAL | Status: DC
  Administered 2018-10-23 – 2018-10-24 (×2): 0.5 mg via ORAL
  Filled 2018-10-23 (×2): qty 1

## 2018-10-23 MED ORDER — ALUM & MAG HYDROXIDE-SIMETH 200-200-20 MG/5ML PO SUSP
30.0000 mL | Freq: Once | ORAL | Status: AC
  Administered 2018-10-23: 17:00:00 30 mL via ORAL
  Filled 2018-10-23: qty 30

## 2018-10-23 MED ORDER — LIDOCAINE VISCOUS HCL 2 % MT SOLN
15.0000 mL | Freq: Once | OROMUCOSAL | Status: AC
  Administered 2018-10-23: 17:00:00 15 mL via ORAL
  Filled 2018-10-23: qty 15

## 2018-10-23 MED ORDER — NITROGLYCERIN 0.4 MG SL SUBL: 0.4000 mg | SUBLINGUAL_TABLET | SUBLINGUAL | Status: DC | PRN

## 2018-10-23 MED ORDER — NICOTINE 21 MG/24HR TD PT24
21.0000 mg | MEDICATED_PATCH | Freq: Every day | TRANSDERMAL | Status: DC
  Administered 2018-10-23 – 2018-10-25 (×3): 21 mg via TRANSDERMAL
  Filled 2018-10-23 (×3): qty 1

## 2018-10-23 MED ORDER — SODIUM CHLORIDE 0.9% FLUSH: 3.0000 mL | Freq: Once | INTRAVENOUS | Status: DC

## 2018-10-23 MED ORDER — ASPIRIN EC 81 MG PO TBEC
81.0000 mg | DELAYED_RELEASE_TABLET | Freq: Every day | ORAL | Status: DC
  Administered 2018-10-24 – 2018-10-25 (×2): 81 mg via ORAL
  Filled 2018-10-23 (×2): qty 1

## 2018-10-23 MED ORDER — ACETAMINOPHEN 325 MG PO TABS
650.0000 mg | ORAL_TABLET | ORAL | Status: DC | PRN
  Administered 2018-10-25: 09:00:00 650 mg via ORAL
  Filled 2018-10-23: qty 2

## 2018-10-23 MED ORDER — SODIUM CHLORIDE 0.9 % IV SOLN
INTRAVENOUS | Status: DC
  Administered 2018-10-23 – 2018-10-24 (×3): via INTRAVENOUS

## 2018-10-23 MED ORDER — SODIUM CHLORIDE 0.9 % IV SOLN: 250.0000 mL | INTRAVENOUS | Status: DC | PRN

## 2018-10-23 MED ORDER — ALBUTEROL SULFATE HFA 108 (90 BASE) MCG/ACT IN AERS
4.0000 | INHALATION_SPRAY | Freq: Once | RESPIRATORY_TRACT | Status: AC
  Administered 2018-10-23: 4 via RESPIRATORY_TRACT
  Filled 2018-10-23: qty 6.7

## 2018-10-23 NOTE — ED Notes (Signed)
[facility] called for transport. 

## 2018-10-23 NOTE — Progress Notes (Signed)
Pt arrived to [facility] via [facility].  Pt is alert and stable condition.  Pt oriented to room and placed on telemetry.  CCMD notified.  Call light placed within reach.  Oncall for Dr. Algie Coffer.  MD was paged to make aware that pt is admitted to [facility].Blood pressure (!) 126/101, pulse (!) 113, temperature 98.2 F (36.8 C), temperature source Oral, resp. rate 16, height 5\' 10"  (1.778 m), weight 90.7 kg, SpO2 93 %.

## 2018-10-23 NOTE — ED Notes (Signed)
CRITICAL VALUE STICKER  CRITICAL VALUE: Troponin 0.04  DATE & TIME NOTIFIED: 10/23/18 1845  MD NOTIFIED: Adela Lank EDP  TIME OF NOTIFICATION: 7588

## 2018-10-23 NOTE — Plan of Care (Signed)
  Problem: Education: Goal: Knowledge of General Education information will improve Description: Including pain rating scale, medication(s)/side effects and non-pharmacologic comfort measures Outcome: Progressing   Problem: Safety: Goal: Ability to remain free from injury will improve Outcome: Progressing   

## 2018-10-23 NOTE — Progress Notes (Signed)
ANTICOAGULATION CONSULT NOTE - Initial Consult  Pharmacy Consult for Heparin Indication: chest pain/ACS  No Known Allergies  Patient Measurements: Height: 5\' 10"  (177.8 cm)(Simultaneous filing. User may not have seen previous data.) Weight: 200 lb (90.7 kg)(Simultaneous filing. User may not have seen previous data.) IBW/kg (Calculated) : 73 Heparin Dosing Weight: total body weight  Vital Signs: Temp: 98.8 F (37.1 C) (04/07 1644) Temp Source: Oral (04/07 1644) BP: 149/109 (04/07 1832) Pulse Rate: 107 (04/07 1832)  Labs: Recent Labs    10/23/18 1720  HGB 18.2*  HCT 49.9  PLT 257  CREATININE 1.40*  TROPONINI 0.04*    Estimated Creatinine Clearance: 79.5 mL/min (A) (by C-G formula based on SCr of 1.4 mg/dL (H)).   Medical History: Past Medical History:  Diagnosis Date  . Asthma   . Hypertension    Takes no medicine    Medications:  No oral anticoagulation PTA  Assessment:  89yr male with PMH significant for HTN and asthma presents with c/o chest pain, SOB and nausea/vomiting.    Pharmacy consulted to dose IV heparin for r/o NSTEMI/ACS  Goal of Therapy:  Heparin level 0.3-0.7 units/ml Monitor platelets by anticoagulation protocol: Yes   Plan:   Obtain baseline aPTT and PT/INR  Heparin 4000 unit IV bolus x 1 followed by heparin infusion @ 1200 units/hr  Check heparin level 6 hr after heparin started  Follow heparin level and CBC daily   Hung Rhinesmith, Joselyn Glassman, PharmD 10/23/2018,7:03 PM

## 2018-10-23 NOTE — H&P (Signed)
Referring Physician:  [name] is an 43 y.o. male.                       Chief Complaint: Chest pain  HPI: 43 year old white male has 2 month h/o of recurrent non-radiating chest pain associated with shortness of breath, diaphoresis and with and without exertion. He also has nausea. He denies significant cough, fever and chills. He has PMH of hypertension and smoking 1.5 pack of cigarettes per day.    Past Medical History:  Diagnosis Date  . Asthma   . Hypertension    Takes no medicine      History reviewed. No pertinent surgical history.  Family History  Problem Relation Age of Onset  . Cancer Mother   . COPD Mother   . Diabetes Father    Social History:  reports that he has been smoking cigarettes. He has been smoking about 1.50 packs per day. He has never used smokeless tobacco. He reports current alcohol use. He reports that he does not use drugs.  Allergies: No Known Allergies  (Not in a hospital admission)   Results for orders placed or performed during the hospital encounter of 10/23/18 (from the past 48 hour(s))  Basic metabolic panel     Status: Abnormal   Collection Time: 10/23/18  5:20 PM  Result Value Ref Range   Sodium 137 135 - 145 mmol/L   Potassium 3.5 3.5 - 5.1 mmol/L   Chloride 96 (L) 98 - 111 mmol/L   CO2 26 22 - 32 mmol/L   Glucose, Bld 119 (H) 70 - 99 mg/dL   BUN 21 (H) 6 - 20 mg/dL   Creatinine, Ser 2.54 (H) 0.61 - 1.24 mg/dL   Calcium 98.2 (H) 8.9 - 10.3 mg/dL   GFR calc non Af Amer >60 >60 mL/min   GFR calc Af Amer >60 >60 mL/min   Anion gap 15 5 - 15    Comment: Performed at [facility], [address], [place], [place] [address]  CBC     Status: Abnormal   Collection Time: 10/23/18  5:20 PM  Result Value Ref Range   WBC 13.7 (H) 4.0 - 10.5 K/uL   RBC 4.84 4.22 - 5.81 MIL/uL   Hemoglobin 18.2 (H) 13.0 - 17.0 g/dL   HCT 30.9 40.7 - 68.0 %   MCV 103.1 (H) 80.0 - 100.0 fL   MCH 37.6 (H) 26.0 - 34.0 pg   MCHC 36.5  (H) 30.0 - 36.0 g/dL   RDW 88.1 10.3 - 15.9 %   Platelets 257 150 - 400 K/uL   nRBC 0.0 0.0 - 0.2 %    Comment: Performed at [facility], [address], [place], [place] [address]  Troponin I - ONCE - STAT     Status: Abnormal   Collection Time: 10/23/18  5:20 PM  Result Value Ref Range   Troponin I 0.04 (HH) <0.03 ng/mL    Comment: CRITICAL RESULT CALLED TO, READ BACK BY AND VERIFIED WITH: C.FRANKLIN AT 1845 ON 10/23/18 BY N.THOMPSON Performed at [facility], [address] [place], [place] [address]   Lipase, blood     Status: None   Collection Time: 10/23/18  5:20 PM  Result Value Ref Range   Lipase 21 11 - 51 U/L    Comment: Performed at [facility], [address] [place], [place] [address]  Hepatic function panel  Status: Abnormal   Collection Time: 10/23/18  5:20 PM  Result Value Ref Range   Total Protein 7.9 6.5 - 8.1 g/dL   Albumin 4.6 3.5 - 5.0 g/dL   AST 26 15 - 41 U/L   ALT 23 0 - 44 U/L   Alkaline Phosphatase 81 38 - 126 U/L   Total Bilirubin 1.7 (H) 0.3 - 1.2 mg/dL   Bilirubin, Direct 0.4 (H) 0.0 - 0.2 mg/dL   Indirect Bilirubin 1.3 (H) 0.3 - 0.9 mg/dL    Comment: Performed at [facility], [address] [place], [place] [address]  D-dimer, quantitative (not at [facility])     Status: None   Collection Time: 10/23/18  5:20 PM  Result Value Ref Range   D-Dimer, Quant <0.27 0.00 - 0.50 ug/mL-FEU    Comment: (NOTE) At the manufacturer cut-off of 0.50 ug/mL FEU, this assay has been documented to exclude PE with a sensitivity and negative predictive value of 97 to 99%.  At this time, this assay has not been approved by the FDA to exclude DVT/VTE. Results should be correlated with clinical presentation. Performed at [facility], [address], [place], [place] [address]    Dg Chest 2 View  Result Date: 10/23/2018 CLINICAL DATA:  Chest pain EXAM: CHEST - 2 VIEW  COMPARISON:  July 13, 2018 FINDINGS: Lungs are clear. Heart size and pulmonary vascularity are normal. No adenopathy. No bone lesions. No pneumothorax. IMPRESSION: No edema or consolidation. Electronically Signed   By: Bretta Bang III M.D.   On: 10/23/2018 18:12    Review Of Systems Constitutional: No fever, chills, weight loss or gain. Eyes: No vision change, wears glasses. No discharge or pain. Ears: No hearing loss, No tinnitus. Respiratory: No asthma, COPD, pneumonias. Positive shortness of breath. No hemoptysis. Cardiovascular: Positive chest pain, palpitation, leg edema. Gastrointestinal: Positive nausea, vomiting, no diarrhea, no constipation. No GI bleed. No hepatitis. Genitourinary: No dysuria, hematuria, kidney stone. No incontinance. Neurological: No headache, stroke, seizures.  Psychiatry: No psych facility admission for anxiety, depression, suicide. No detox. Skin: No rash. Musculoskeletal: No joint pain, fibromyalgia. No neck pain, back pain. Lymphadenopathy: No lymphadenopathy. Hematology: No anemia or easy bruising.   Blood pressure (!) 149/109, pulse (!) 107, temperature 98.8 F (37.1 C), temperature source Oral, resp. rate 18, height 5\' 10"  (1.778 m), weight 90.7 kg, SpO2 96 %. Body mass index is 28.7 kg/m. General appearance: alert, cooperative, appears stated age and no distress Head: Normocephalic, atraumatic. Eyes: pink conjunctiva, corneas clear. PERRL, EOM's intact. Neck: No adenopathy, no carotid bruit, no JVD, supple, symmetrical, trachea midline and thyroid not enlarged. Resp: Clear to auscultation bilaterally. Cardio: Regular rate and rhythm, S1, S2 normal, II/VI systolic murmur, no click, rub or gallop GI: Soft, non-tender; bowel sounds normal; no organomegaly. Extremities: No edema, cyanosis or clubbing. Skin: Warm and dry.  Neurologic: Alert and oriented X 3, normal strength. Normal coordination and gait.  Assessment/Plan Acute coronary  syndrome Hypertension Tobacco use disorder  Admit. IV heparin IV fluids Hold HCTZ. Add B-blocker Cardiac cath v/s nuclear stress test in AM.  Jesus Rodriguez, MD  10/23/2018, 8:00 PM

## 2018-10-23 NOTE — ED Triage Notes (Addendum)
Pt reports intermittent chest pain and SHOB x2 weeks. Pt reports that he will have episodes of chest pain that make him diaphoretic and nauseated, pt reports vomiting sometimes from pain. Pt reports hx of asthma.

## 2018-10-23 NOTE — ED Provider Notes (Signed)
[facility]-EMERGENCY DEPT Provider Note   CSN: [id number] Arrival date & time: 10/23/18  1627    History   Chief Complaint Chief Complaint  Patient presents with  . Chest Pain    HPI Jesus Fuller is a 43 y.o. male.     43 yo M with a chief complaint of chest pain.  This been going on for the past couple months.  Seems to come and go.  Last for about an hour at a time when it comes associated with some shortness of breath and diaphoresis.  Sometimes happens on exertion sometimes will happen with just sitting still.  Sometimes gets nauseated and vomiting.  Has a pinpoint area on the right side just beneath the costal margin.  No fevers or chills.  Has been coughing a little bit but thinks is related to his smoking and his allergies.  He has been hypertensive on multiple visits to the ED, was started on medication for that.  Has not seen a doctor and is unsure of his hyperlipidemia diabetes.  No known family history of coronary artery disease.  He denies history of prior MI.  Denies history of PE or DVT.  Denies recent hospitalization surgery immobilization.  Denies hemoptysis denies unilateral lower extremity edema.  The history is provided by the patient.  Chest Pain  Pain location:  R chest Pain quality: pressure and sharp   Pain radiates to:  Does not radiate Pain severity:  Moderate Onset quality:  Gradual Duration:  2 months Timing:  Intermittent Progression:  Waxing and waning Chronicity:  New Relieved by:  Nothing Worsened by:  Nothing Ineffective treatments:  None tried Associated symptoms: diaphoresis, dizziness, nausea and vomiting   Associated symptoms: no abdominal pain, no fever, no headache, no palpitations and no shortness of breath     Past Medical History:  Diagnosis Date  . Asthma   . Hypertension    Takes no medicine    Patient Active Problem List   Diagnosis Date Noted  . Acute coronary syndrome (HCC) 10/23/2018    History  reviewed. No pertinent surgical history.      Home Medications    Prior to Admission medications   Medication Sig Start Date End Date Taking? Authorizing Provider  aspirin 325 MG tablet Take 325 mg by mouth daily as needed for headache.   Yes [provider]  hydrochlorothiazide (HYDRODIURIL) 12.5 MG tablet Take 1 tablet (12.5 mg total) by mouth daily. 10/18/18  Yes Fawze, Mina A, PA-C  cyclobenzaprine (FLEXERIL) 10 MG tablet Take 1 tablet (10 mg total) by mouth 2 (two) times daily as needed for muscle spasms. Patient not taking: Reported on 10/23/2018 10/18/18   Michela PitcherFawze, Mina A, PA-C  methylPREDNISolone (MEDROL DOSEPAK) 4 MG TBPK tablet Follow instruction on packaging Patient not taking: Reported on 10/23/2018 10/18/18   Michela PitcherFawze, Mina A, PA-C  naproxen (NAPROSYN) 500 MG tablet Take 1 tablet (500 mg total) by mouth 2 (two) times daily. Patient not taking: Reported on 10/23/2018 10/01/18   Arlyn DunningMcLean, Kelly A, PA-C    Family History Family History  Problem Relation Age of Onset  . Cancer Mother   . COPD Mother   . Diabetes Father     Social History Social History   Tobacco Use  . Smoking status: Current Every Day Smoker    Packs/day: 1.50    Types: Cigarettes  . Smokeless tobacco: Never Used  Substance Use Topics  . Alcohol use: Yes    Comment: occasional  .  Drug use: No     Allergies   Patient has no known allergies.   Review of Systems Review of Systems  Constitutional: Positive for diaphoresis. Negative for chills and fever.  HENT: Negative for congestion and facial swelling.   Eyes: Negative for discharge and visual disturbance.  Respiratory: Negative for shortness of breath.   Cardiovascular: Positive for chest pain. Negative for palpitations.  Gastrointestinal: Positive for nausea and vomiting. Negative for abdominal pain and diarrhea.  Musculoskeletal: Negative for arthralgias and myalgias.  Skin: Negative for color change and rash.  Neurological: Positive for  dizziness and syncope. Negative for tremors and headaches.  Psychiatric/Behavioral: Negative for confusion and dysphoric mood.     Physical Exam Updated Vital Signs BP (!) 149/109   Pulse (!) 107   Temp 98.8 F (37.1 C) (Oral)   Resp 18   Ht  (1.778 m) Comment: Simultaneous filing. User may not have seen previous data.  Wt 90.7 kg Comment: Simultaneous filing. User may not have seen previous data.  SpO2 96%   BMI 28.70 kg/m   Physical Exam Vitals signs and nursing note reviewed.  Constitutional:      Appearance: He is well-developed.  HENT:     Head: Normocephalic and atraumatic.  Eyes:     Pupils: Pupils are equal, round, and reactive to light.  Neck:     Musculoskeletal: Normal range of motion and neck supple.     Vascular: No JVD.  Cardiovascular:     Rate and Rhythm: Normal rate and regular rhythm.     Heart sounds: No murmur. No friction rub. No gallop.   Pulmonary:     Effort: No respiratory distress.     Breath sounds: No wheezing.  Abdominal:     General: There is no distension.     Tenderness: There is no guarding or rebound.     Comments: Mild epigastric abdominal pain.  No pain in the right upper quadrant, negative Murphy's  Musculoskeletal: Normal range of motion.  Skin:    Coloration: Skin is not pale.     Findings: No rash.  Neurological:     Mental Status: He is alert and oriented to person, place, and time.  Psychiatric:        Behavior: Behavior normal.      ED Treatments / Results  Labs (all labs ordered are listed, but only abnormal results are displayed) Labs Reviewed  BASIC METABOLIC PANEL - Abnormal; Notable for the following components:      Result Value   Chloride 96 (*)    Glucose, Bld 119 (*)    BUN 21 (*)    Creatinine, Ser 1.40 (*)    Calcium 10.9 (*)    All other components within normal limits  CBC - Abnormal; Notable for the following components:   WBC 13.7 (*)    Hemoglobin 18.2 (*)    MCV 103.1 (*)    MCH 37.6  (*)    MCHC 36.5 (*)    All other components within normal limits  TROPONIN I - Abnormal; Notable for the following components:   Troponin I 0.04 (*)    All other components within normal limits  HEPATIC FUNCTION PANEL - Abnormal; Notable for the following components:   Total Bilirubin 1.7 (*)    Bilirubin, Direct 0.4 (*)    Indirect Bilirubin 1.3 (*)    All other components within normal limits  LIPASE, BLOOD  D-DIMER, QUANTITATIVE (NOT AT [facility])  APTT  PROTIME-INR  HEPARIN LEVEL (UNFRACTIONATED)  CBC  HIV ANTIBODY (ROUTINE TESTING W REFLEX)  TROPONIN I  TROPONIN I  TROPONIN I  BASIC METABOLIC PANEL  LIPID PANEL    EKG EKG Interpretation  Date/Time:  Tuesday October 23 2018 16:43:47 EDT Ventricular Rate:  121 PR Interval:    QRS Duration: 87 QT Interval:  312 QTC Calculation: 443 R Axis:   88 Text Interpretation:  Sinus tachycardia LAE, consider biatrial enlargement Baseline wander in lead(s) V6 No old tracing to compare Confirmed by Melene Plan [phone]) on 10/23/2018 5:03:46 PM   Radiology Dg Chest 2 View  Result Date: 10/23/2018 CLINICAL DATA:  Chest pain EXAM: CHEST - 2 VIEW COMPARISON:  July 13, 2018 FINDINGS: Lungs are clear. Heart size and pulmonary vascularity are normal. No adenopathy. No bone lesions. No pneumothorax. IMPRESSION: No edema or consolidation. Electronically Signed   By: Bretta Bang III M.D.   On: 10/23/2018 18:12    Procedures Procedures (including critical care time)  Medications Ordered in ED Medications  sodium chloride flush (NS) 0.9 % injection 3 mL (0 mLs Intravenous Hold 10/23/18 1734)  heparin bolus via infusion 4,000 Units (4,000 Units Intravenous Bolus from Bag 10/23/18 1958)    Followed by  heparin ADULT infusion 100 units/mL (25000 units/281mL sodium chloride 0.45%) (1,200 Units/hr Intravenous New Bag/Given 10/23/18 2001)  nicotine (NICODERM CQ - dosed in mg/24 hours) patch 21 mg (has no administration in time range)  aspirin EC  tablet 81 mg (has no administration in time range)  nitroGLYCERIN (NITROSTAT) SL tablet 0.4 mg (has no administration in time range)  acetaminophen (TYLENOL) tablet 650 mg (has no administration in time range)  ondansetron (ZOFRAN) injection 4 mg (has no administration in time range)  sodium chloride flush (NS) 0.9 % injection 3 mL (has no administration in time range)  0.9 %  sodium chloride infusion (has no administration in time range)  albuterol (PROVENTIL HFA;VENTOLIN HFA) 108 (90 Base) MCG/ACT inhaler 4 puff (4 puffs Inhalation Given 10/23/18 1725)  alum & mag hydroxide-simeth (MAALOX/MYLANTA) 200-200-20 MG/5ML suspension 30 mL (30 mLs Oral Given 10/23/18 1725)    And  lidocaine (XYLOCAINE) 2 % viscous mouth solution 15 mL (15 mLs Oral Given 10/23/18 1726)     Initial Impression / Assessment and Plan / ED Course  I have reviewed the triage vital signs and the nursing notes.  Pertinent labs & imaging results that were available during my care of the patient were reviewed by me and considered in my medical decision making (see chart for details).        43 yo M with a chief complaint of chest pain.  This is right-sided described sharp it as a pressure.  He does have some concerning symptoms are he gets diaphoretic and has had episodes of syncope with this.  Going on for the past 2 or 3 months.  He is well-appearing and nontoxic but was tachycardic into the 120s on arrival.  Will obtain a delta troponin d-dimer chest x-ray lab work.  Give a GI cocktail and reassess.  Patient's troponin was positive at 0.04.  Continues to be pain-free here.  With his story concerning with pressure with diaphoresis nausea and vomiting and shortness of breath will start on heparin.  He was given 324 of aspirin.  Case was discussed with Dr. Algie Coffer will come and evaluate the patient at bedside.  Will admit.   CRITICAL CARE Performed by: Rae Roam   Total critical care time: 35 minutes  Critical  care time was exclusive of separately billable procedures and treating other patients.  Critical care was necessary to treat or prevent imminent or life-threatening deterioration.  Critical care was time spent personally by me on the following activities: development of treatment plan with patient and/or surrogate as well as nursing, discussions with consultants, evaluation of patient's response to treatment, examination of patient, obtaining history from patient or surrogate, ordering and performing treatments and interventions, ordering and review of laboratory studies, ordering and review of radiographic studies, pulse oximetry and re-evaluation of patient's condition.  The patients results and plan were reviewed and discussed.   Any x-rays performed were independently reviewed by myself.   Differential diagnosis were considered with the presenting HPI.  Medications  sodium chloride flush (NS) 0.9 % injection 3 mL (0 mLs Intravenous Hold 10/23/18 1734)  heparin bolus via infusion 4,000 Units (4,000 Units Intravenous Bolus from Bag 10/23/18 1958)    Followed by  heparin ADULT infusion 100 units/mL (25000 units/242mL sodium chloride 0.45%) (1,200 Units/hr Intravenous New Bag/Given 10/23/18 2001)  nicotine (NICODERM CQ - dosed in mg/24 hours) patch 21 mg (has no administration in time range)  aspirin EC tablet 81 mg (has no administration in time range)  nitroGLYCERIN (NITROSTAT) SL tablet 0.4 mg (has no administration in time range)  acetaminophen (TYLENOL) tablet 650 mg (has no administration in time range)  ondansetron (ZOFRAN) injection 4 mg (has no administration in time range)  sodium chloride flush (NS) 0.9 % injection 3 mL (has no administration in time range)  0.9 %  sodium chloride infusion (has no administration in time range)  albuterol (PROVENTIL HFA;VENTOLIN HFA) 108 (90 Base) MCG/ACT inhaler 4 puff (4 puffs Inhalation Given 10/23/18 1725)  alum & mag hydroxide-simeth (MAALOX/MYLANTA)  200-200-20 MG/5ML suspension 30 mL (30 mLs Oral Given 10/23/18 1725)    And  lidocaine (XYLOCAINE) 2 % viscous mouth solution 15 mL (15 mLs Oral Given 10/23/18 1726)    Vitals:   10/23/18 1644 10/23/18 1832  BP: (!) 139/107 (!) 149/109  Pulse: (!) 121 (!) 107  Resp: 18 18  Temp: 98.8 F (37.1 C)   TempSrc: Oral   SpO2: 97% 96%  Weight: 90.7 kg   Height: 5\' 10"  (1.778 m)     Final diagnoses:  NSTEMI (non-ST elevated myocardial infarction) [facility])    Admission/ observation were discussed with the admitting physician, patient and/or family and they are comfortable with the plan.   Final Clinical Impressions(s) / ED Diagnoses   Final diagnoses:  NSTEMI (non-ST elevated myocardial infarction) [facility])    ED Discharge Orders    None       Melene Plan, DO 10/23/18 2008

## 2018-10-24 ENCOUNTER — Inpatient Hospital Stay (HOSPITAL_COMMUNITY): Payer: Self-pay

## 2018-10-24 ENCOUNTER — Encounter (HOSPITAL_COMMUNITY)
Admission: EM | Disposition: A | Discharge status: Home / Self Care | Payer: Self-pay | Source: Home / Self Care | Attending: Cardiovascular Disease

## 2018-10-24 HISTORY — PX: RIGHT/LEFT HEART CATH AND CORONARY ANGIOGRAPHY: CATH118266

## 2018-10-24 LAB — POCT I-STAT 7, (LYTES, BLD GAS, ICA,H+H)
Acid-Base Excess: 3 mmol/L — ABNORMAL HIGH (ref 0.0–2.0)
Bicarbonate: 27 mmol/L (ref 20.0–28.0)
Calcium, Ion: 1.18 mmol/L (ref 1.15–1.40)
HCT: 44 % (ref 39.0–52.0)
Hemoglobin: 15 g/dL (ref 13.0–17.0)
O2 Saturation: 91 %
Potassium: 3.5 mmol/L (ref 3.5–5.1)
Sodium: 133 mmol/L — ABNORMAL LOW (ref 135–145)
TCO2: 28 mmol/L (ref 22–32)
pCO2 arterial: 39.3 mmHg (ref 32.0–48.0)
pH, Arterial: 7.445 (ref 7.350–7.450)
pO2, Arterial: 59 mmHg — ABNORMAL LOW (ref 83.0–108.0)

## 2018-10-24 LAB — LIPID PANEL
Cholesterol: 177 mg/dL (ref 0–200)
HDL: 63 mg/dL (ref >40)
LDL Cholesterol: 90 mg/dL (ref 0–99)
Total CHOL/HDL Ratio: 2.8 RATIO
Triglycerides: 122 mg/dL (ref <150)
VLDL: 24 mg/dL (ref 0–40)

## 2018-10-24 LAB — CBC WITH DIFFERENTIAL/PLATELET
Abs Immature Granulocytes: 0.02 10*3/uL (ref 0.00–0.07)
Basophils Absolute: 0.1 10*3/uL (ref 0.0–0.1)
Basophils Relative: 1 %
Eosinophils Absolute: 0.1 10*3/uL (ref 0.0–0.5)
Eosinophils Relative: 1 %
HCT: 45.5 % (ref 39.0–52.0)
Hemoglobin: 16.3 g/dL (ref 13.0–17.0)
Immature Granulocytes: 0 %
Lymphocytes Relative: 26 %
Lymphs Abs: 2.2 10*3/uL (ref 0.7–4.0)
MCH: 36.4 pg — ABNORMAL HIGH (ref 26.0–34.0)
MCHC: 35.8 g/dL (ref 30.0–36.0)
MCV: 101.6 fL — ABNORMAL HIGH (ref 80.0–100.0)
Monocytes Absolute: 0.7 10*3/uL (ref 0.1–1.0)
Monocytes Relative: 8 %
Neutro Abs: 5.4 10*3/uL (ref 1.7–7.7)
Neutrophils Relative %: 64 %
Platelets: 194 10*3/uL (ref 150–400)
RBC: 4.48 MIL/uL (ref 4.22–5.81)
RDW: 14.9 % (ref 11.5–15.5)
WBC: 8.5 10*3/uL (ref 4.0–10.5)
nRBC: 0 % (ref 0.0–0.2)

## 2018-10-24 LAB — POCT I-STAT EG7
Acid-Base Excess: 3 mmol/L — ABNORMAL HIGH (ref 0.0–2.0)
Bicarbonate: 28.3 mmol/L — ABNORMAL HIGH (ref 20.0–28.0)
Calcium, Ion: 1.2 mmol/L (ref 1.15–1.40)
HCT: 44 % (ref 39.0–52.0)
Hemoglobin: 15 g/dL (ref 13.0–17.0)
O2 Saturation: 68 %
Potassium: 3.6 mmol/L (ref 3.5–5.1)
Sodium: 135 mmol/L (ref 135–145)
TCO2: 30 mmol/L (ref 22–32)
pCO2, Ven: 42.3 mmHg — ABNORMAL LOW (ref 44.0–60.0)
pH, Ven: 7.433 — ABNORMAL HIGH (ref 7.250–7.430)
pO2, Ven: 35 mmHg (ref 32.0–45.0)

## 2018-10-24 LAB — BASIC METABOLIC PANEL
Anion gap: 14 (ref 5–15)
BUN: 24 mg/dL — ABNORMAL HIGH (ref 6–20)
CO2: 25 mmol/L (ref 22–32)
Calcium: 9.5 mg/dL (ref 8.9–10.3)
Chloride: 95 mmol/L — ABNORMAL LOW (ref 98–111)
Creatinine, Ser: 1.12 mg/dL (ref 0.61–1.24)
GFR calc Af Amer: >60 mL/min (ref >60)
GFR calc non Af Amer: >60 mL/min (ref >60)
Glucose, Bld: 100 mg/dL — ABNORMAL HIGH (ref 70–99)
Potassium: 3.6 mmol/L (ref 3.5–5.1)
Sodium: 134 mmol/L — ABNORMAL LOW (ref 135–145)

## 2018-10-24 LAB — HIV ANTIBODY (ROUTINE TESTING W REFLEX): HIV Screen 4th Generation wRfx: NONREACTIVE

## 2018-10-24 LAB — HEPARIN LEVEL (UNFRACTIONATED)
Heparin Unfractionated: 0.2 IU/mL — ABNORMAL LOW (ref 0.30–0.70)
Heparin Unfractionated: 0.23 IU/mL — ABNORMAL LOW (ref 0.30–0.70)

## 2018-10-24 LAB — TROPONIN I: Troponin I: <0.03 ng/mL (ref <0.03)

## 2018-10-24 LAB — POCT ACTIVATED CLOTTING TIME: Activated Clotting Time: 76 seconds

## 2018-10-24 SURGERY — RIGHT/LEFT HEART CATH AND CORONARY ANGIOGRAPHY
Anesthesia: LOCAL

## 2018-10-24 MED ORDER — HYDRALAZINE HCL 20 MG/ML IJ SOLN
INTRAMUSCULAR | Status: AC
  Filled 2018-10-24: qty 1

## 2018-10-24 MED ORDER — SODIUM CHLORIDE 0.9% FLUSH: 3.0000 mL | Freq: Two times a day (BID) | INTRAVENOUS | Status: DC

## 2018-10-24 MED ORDER — HEPARIN (PORCINE) IN NACL 1000-0.9 UT/500ML-% IV SOLN
INTRAVENOUS | Status: AC
  Filled 2018-10-24: qty 1000

## 2018-10-24 MED ORDER — SODIUM CHLORIDE 0.9% FLUSH
3.0000 mL | Freq: Two times a day (BID) | INTRAVENOUS | Status: DC
  Administered 2018-10-25: 3 mL via INTRAVENOUS

## 2018-10-24 MED ORDER — LABETALOL HCL 5 MG/ML IV SOLN: 10.0000 mg | INTRAVENOUS | Status: AC | PRN

## 2018-10-24 MED ORDER — TECHNETIUM TC 99M TETROFOSMIN IV KIT
10.0000 | PACK | Freq: Once | INTRAVENOUS | Status: AC | PRN
  Administered 2018-10-24: 10 via INTRAVENOUS

## 2018-10-24 MED ORDER — SODIUM CHLORIDE 0.9 % IV SOLN: 250.0000 mL | INTRAVENOUS | Status: DC | PRN

## 2018-10-24 MED ORDER — SODIUM CHLORIDE 0.9 % IV SOLN: INTRAVENOUS | Status: DC

## 2018-10-24 MED ORDER — SODIUM CHLORIDE 0.9% FLUSH: 3.0000 mL | INTRAVENOUS | Status: DC | PRN

## 2018-10-24 MED ORDER — TECHNETIUM TC 99M TETROFOSMIN IV KIT
30.0000 | PACK | Freq: Once | INTRAVENOUS | Status: AC | PRN
  Administered 2018-10-24: 30 via INTRAVENOUS

## 2018-10-24 MED ORDER — MIDAZOLAM HCL 2 MG/2ML IJ SOLN
INTRAMUSCULAR | Status: DC | PRN
  Administered 2018-10-24: 1 mg via INTRAVENOUS
  Administered 2018-10-24: 2 mg via INTRAVENOUS

## 2018-10-24 MED ORDER — SODIUM CHLORIDE 0.9 % IV SOLN: INTRAVENOUS | Status: AC

## 2018-10-24 MED ORDER — HEPARIN (PORCINE) IN NACL 1000-0.9 UT/500ML-% IV SOLN
INTRAVENOUS | Status: DC | PRN
  Administered 2018-10-24 (×2): 500 mL

## 2018-10-24 MED ORDER — FENTANYL CITRATE (PF) 100 MCG/2ML IJ SOLN
INTRAMUSCULAR | Status: AC
  Filled 2018-10-24: qty 2

## 2018-10-24 MED ORDER — MIDAZOLAM HCL 2 MG/2ML IJ SOLN
INTRAMUSCULAR | Status: AC
  Filled 2018-10-24: qty 2

## 2018-10-24 MED ORDER — FENTANYL CITRATE (PF) 100 MCG/2ML IJ SOLN
INTRAMUSCULAR | Status: DC | PRN
  Administered 2018-10-24: 25 ug via INTRAVENOUS
  Administered 2018-10-24: 50 ug via INTRAVENOUS

## 2018-10-24 MED ORDER — REGADENOSON 0.4 MG/5ML IV SOLN
0.4000 mg | Freq: Once | INTRAVENOUS | Status: AC
  Administered 2018-10-24: 0.4 mg via INTRAVENOUS
  Filled 2018-10-24: qty 5

## 2018-10-24 MED ORDER — HYDRALAZINE HCL 20 MG/ML IJ SOLN
10.0000 mg | Freq: Once | INTRAMUSCULAR | Status: AC
  Administered 2018-10-24: 10 mg via INTRAVENOUS

## 2018-10-24 MED ORDER — LIDOCAINE HCL (PF) 1 % IJ SOLN
INTRAMUSCULAR | Status: AC
  Filled 2018-10-24: qty 30

## 2018-10-24 MED ORDER — IOHEXOL 350 MG/ML SOLN
INTRAVENOUS | Status: DC | PRN
  Administered 2018-10-24: 40 mL via INTRA_ARTERIAL

## 2018-10-24 MED ORDER — LIDOCAINE HCL (PF) 1 % IJ SOLN
INTRAMUSCULAR | Status: DC | PRN
  Administered 2018-10-24: 15 mL
  Administered 2018-10-24: 20 mL

## 2018-10-24 MED ORDER — HEPARIN BOLUS VIA INFUSION
1000.0000 [IU] | Freq: Once | INTRAVENOUS | Status: AC
  Administered 2018-10-24: 1000 [IU] via INTRAVENOUS
  Filled 2018-10-24: qty 1000

## 2018-10-24 MED ORDER — NICOTINE 21 MG/24HR TD PT24: 21.0000 mg | MEDICATED_PATCH | Freq: Every day | TRANSDERMAL | Status: DC

## 2018-10-24 MED ORDER — REGADENOSON 0.4 MG/5ML IV SOLN
INTRAVENOUS | Status: AC
  Filled 2018-10-24: qty 5

## 2018-10-24 SURGICAL SUPPLY — 10 items
CATH INFINITI 5FR MULTPACK ANG (CATHETERS) ×1 IMPLANT
CATH SWAN GANZ 7F STRAIGHT (CATHETERS) ×1 IMPLANT
KIT HEART LEFT (KITS) ×2 IMPLANT
PACK CARDIAC CATHETERIZATION (CUSTOM PROCEDURE TRAY) ×2 IMPLANT
SHEATH PINNACLE 5F 10CM (SHEATH) ×1 IMPLANT
SHEATH PINNACLE 7F 10CM (SHEATH) ×1 IMPLANT
TRANSDUCER W/STOPCOCK (MISCELLANEOUS) ×2 IMPLANT
TUBING CIL FLEX 10 FLL-RA (TUBING) ×1 IMPLANT
WIRE EMERALD 3MM-J .025X260CM (WIRE) ×1 IMPLANT
WIRE EMERALD 3MM-J .035X150CM (WIRE) ×1 IMPLANT

## 2018-10-24 NOTE — Progress Notes (Signed)
ANTICOAGULATION CONSULT NOTE  Pharmacy Consult for Heparin Indication: chest pain/ACS  No Known Allergies  Patient Measurements: Height: 5\' 9"  (175.3 cm) Weight: 192 lb 3.2 oz (87.2 kg) IBW/kg (Calculated) : 70.7 Heparin Dosing Weight: total body weight  Vital Signs: Temp: 98.2 F (36.8 C) (04/07 2213) Temp Source: Oral (04/07 2213) BP: 126/101 (04/07 2213) Pulse Rate: 113 (04/07 2213)  Labs: Recent Labs    10/23/18 1720 10/23/18 1949 10/23/18 2102 10/24/18 0226  HGB 18.2*  --   --   --   HCT 49.9  --   --   --   PLT 257  --   --   --   APTT  --  26  --   --   LABPROT  --  13.4  --   --   INR  --  1.0  --   --   HEPARINUNFRC  --   --   --  0.20*  CREATININE 1.40*  --   --   --   TROPONINI 0.04*  --  0.03*  --     Estimated Creatinine Clearance: 76.7 mL/min (A) (by C-G formula based on SCr of 1.4 mg/dL (H)).   Medical History: Past Medical History:  Diagnosis Date  . Asthma   . Hypertension    Takes no medicine    Medications:  No oral anticoagulation PTA  Assessment:  81yr male with PMH significant for HTN and asthma presents with c/o chest pain, SOB and nausea/vomiting.    Pharmacy consulted to dose IV heparin for r/o NSTEMI/ACS  Initial heparin level 0.2 units/ml  Goal of Therapy:  Heparin level 0.3-0.7 units/ml Monitor platelets by anticoagulation protocol: Yes   Plan:   Heparin 1000 unit IV bolus and increase heparin infusion to 1400 units/hr  Check heparin level 6 hr  Follow heparin level and CBC daily   Talbert Cage, PharmD 10/24/2018,3:47 AM

## 2018-10-24 NOTE — Progress Notes (Signed)
Patient is alert and oriented was preparing for a ECHO with Right leg straight and Right sheath site bleeding. Held pressure Paged Dr. Tally Joe alternating holding pressure for 20 mins, Blood pressure stable high gave hydralazine. 10 mg per MD orders. Currently 125/83 HR 77

## 2018-10-24 NOTE — Progress Notes (Signed)
ANTICOAGULATION CONSULT NOTE  Pharmacy Consult for Heparin Indication: chest pain/ACS  No Known Allergies  Patient Measurements: Height: 5\' 9"  (175.3 cm) Weight: 192 lb 3.2 oz (87.2 kg) IBW/kg (Calculated) : 70.7 Heparin Dosing Weight: total body weight  Vital Signs: Temp: 97.7 F (36.5 C) (04/08 0944) Temp Source: Oral (04/08 0944) BP: 153/108 (04/08 0944) Pulse Rate: 80 (04/08 0944)  Labs: Recent Labs    10/23/18 1720 10/23/18 1949 10/23/18 2102 10/24/18 0226 10/24/18 0832 10/24/18 1015  HGB 18.2*  --   --   --  16.3  --   HCT 49.9  --   --   --  45.5  --   PLT 257  --   --   --  194  --   APTT  --  26  --   --   --   --   LABPROT  --  13.4  --   --   --   --   INR  --  1.0  --   --   --   --   HEPARINUNFRC  --   --   --  0.20*  --  0.23*  CREATININE 1.40*  --   --   --  1.12  --   TROPONINI 0.04*  --  0.03*  --  <0.03  --     Estimated Creatinine Clearance: 95.9 mL/min (by C-G formula based on SCr of 1.12 mg/dL).   Medical History: Past Medical History:  Diagnosis Date  . Asthma   . Hypertension    Takes no medicine    Medications:  No oral anticoagulation PTA  Assessment: 56yr male with PMH significant for HTN and asthma presents with c/o chest pain, SOB and nausea/vomiting.  Pharmacy consulted to dose IV heparin for r/o NSTEMI/ACS  Heparin level remains low this morning despite earlier rate reduction.  No bleeding or complications noted.  No known issues with IV infusion.  Goal of Therapy:  Heparin level 0.3-0.7 units/ml Monitor platelets by anticoagulation protocol: Yes   Plan:  Increase IV heparin to 1600 units/hr Recheck heparin level in 6 hrs Daily heparin level and CBC F/u plans for cardiac workup.  Jenetta Downer, [facility] Clinical Pharmacist Phone [phone]  10/24/2018 11:19 AM

## 2018-10-24 NOTE — Progress Notes (Signed)
Patient has labs ordered in epic for today Lab unable to see order per lab direct and assigned lab tech. Reordered  as transcribed in order screen.

## 2018-10-24 NOTE — Procedures (Signed)
Echo attempted. Patient began bleeding from procedure site prior to starting echo. Nurse notified. Will attempt again later.

## 2018-10-24 NOTE — Progress Notes (Signed)
Tolerated test well.  No CP

## 2018-10-24 NOTE — Progress Notes (Signed)
Patient is currently in radiology department heparin rate has changed for 77ml/hr to 46ml/hr sent a new  Bag to [facility]  radiologist

## 2018-10-24 NOTE — Progress Notes (Signed)
Initial Nutrition Assessment  RD working remotely.  DOCUMENTATION CODES:   Not applicable  INTERVENTION:   -RD will follow for diet advancement and supplement as appropriate  NUTRITION DIAGNOSIS:   Inadequate oral intake related to inability to eat as evidenced by NPO status.  GOAL:   Patient will meet greater than or equal to 90% of their needs  MONITOR:   Diet advancement, Labs, Weight trends, Skin, I & O's  REASON FOR ASSESSMENT:   Malnutrition Screening Tool    ASSESSMENT:   43 year old white male has 2 month h/o of recurrent non-radiating chest pain associated with shortness of breath, diaphoresis and with and without exertion. He also has nausea. He denies significant cough, fever and chills. He has PMH of hypertension and smoking 1.5 pack of cigarettes per day.    Pt admitted with acute coronary syndrome.  Reviewed I/O's: +654 ml x 24 hours   Pt down in nuclear medicine at time of visit. RD unable to obtain further nutrition-related history at this time. Per MST report, pt reports he has recently lost weight without try and eating poorly due to a decreased appetite.   Reviewed wt hx; noted pt has experienced a 12.6% wt loss over the past year, which is not significant for time frame, however, concerning given reported history of decreased appetite.   Pt currently NPO, however, will monitor for diet advancement and need for nutritional supplements.   Labs reviewed.   NUTRITION - FOCUSED PHYSICAL EXAM:    Most Recent Value  Orbital Region  Unable to assess  Upper Arm Region  Unable to assess  Thoracic and Lumbar Region  Unable to assess  Buccal Region  Unable to assess  Temple Region  Unable to assess  Clavicle Bone Region  Unable to assess  Clavicle and Acromion Bone Region  Unable to assess  Scapular Bone Region  Unable to assess  Dorsal Hand  Unable to assess  Patellar Region  Unable to assess  Anterior Thigh Region  Unable to assess  Posterior Calf  Region  Unable to assess  Edema (RD Assessment)  Unable to assess  Hair  Unable to assess  Eyes  Unable to assess  Mouth  Unable to assess  Skin  Unable to assess  Nails  Unable to assess       Diet Order:   Diet Order            Diet NPO time specified  Diet effective midnight              EDUCATION NEEDS:   No education needs have been identified at this time  Skin:  Skin Assessment: Reviewed RN Assessment  Last BM:  10/23/18  Height:   Ht Readings from Last 1 Encounters:  10/24/18 5\' 9"  (1.753 m)    Weight:   Wt Readings from Last 1 Encounters:  10/24/18 87.2 kg    Ideal Body Weight:  72.7 kg  BMI:  Body mass index is 28.38 kg/m.  Estimated Nutritional Needs:   Kcal:  1900-2100  Protein:  95-110 grams  Fluid:  1.9-2.1 L    Westyn Keatley A. Mayford Knife, RD, LDN, CDCES Registered Dietitian II Certified Diabetes Care and Education Specialist Pager: [phone] After hours Pager: [phone]

## 2018-10-24 NOTE — Progress Notes (Signed)
Ref: Patient, No Pcp Per   Subjective:  Abnormal stress test. Inferior and apical ischemic changes with low EF. VS stable. Creatinine improved with hydration. Lipid panel unremarkable.  Objective:  Vital Signs in the last 24 hours: Temp:  [97.7 F (36.5 C)-98.8 F (37.1 C)] 97.7 F (36.5 C) (04/08 0944) Pulse Rate:  [64-121] 98 (04/08 1144) Cardiac Rhythm: Normal sinus rhythm (04/08 0700) Resp:  [16-19] 18 (04/08 0944) BP: (122-153)/(61-127) 145/76 (04/08 1144) SpO2:  [93 %-97 %] 97 % (04/08 0944) Weight:  [87.2 kg-90.7 kg] 87.2 kg (04/08 0031)  Physical Exam: BP Readings from Last 1 Encounters:  10/24/18 (!) 145/76     Wt Readings from Last 1 Encounters:  10/24/18 87.2 kg    Weight change:  Body mass index is 28.38 kg/m. HEENT: [place]/AT, Eyes-Conjunctiva-Pink, Sclera-Non-icteric Neck: No JVD, No bruit, Trachea midline. Lungs:  Clear, Bilateral. Cardiac:  Regular rhythm, normal S1 and S2, no S3. II/VI systolic murmur. Abdomen:  Soft, non-tender. BS present. Extremities:  No edema present. No cyanosis. No clubbing. CNS: AxOx3, Cranial nerves grossly intact, moves all 4 extremities.  Skin: Warm and dry.   Intake/Output from previous day: 04/07 0701 - 04/08 0700 In: 654.3 [P.O.:240; I.V.:414.3] Out: -     Lab Results: BMET    Component Value Date/Time   NA 134 (L) 10/24/2018 0832   NA 137 10/23/2018 1720   K 3.6 10/24/2018 0832   K 3.5 10/23/2018 1720   CL 95 (L) 10/24/2018 0832   CL 96 (L) 10/23/2018 1720   CO2 25 10/24/2018 0832   CO2 26 10/23/2018 1720   GLUCOSE 100 (H) 10/24/2018 0832   GLUCOSE 119 (H) 10/23/2018 1720   BUN 24 (H) 10/24/2018 0832   BUN 21 (H) 10/23/2018 1720   CREATININE 1.12 10/24/2018 0832   CREATININE 1.40 (H) 10/23/2018 1720   CREATININE 1.10 10/18/2018 0955   CALCIUM 9.5 10/24/2018 0832   CALCIUM 10.9 (H) 10/23/2018 1720   GFRNONAA >60 10/24/2018 0832   GFRNONAA >60 10/23/2018 1720   GFRAA >60 10/24/2018 0832   GFRAA >60  10/23/2018 1720   CBC    Component Value Date/Time   WBC 8.5 10/24/2018 0832   RBC 4.48 10/24/2018 0832   HGB 16.3 10/24/2018 0832   HCT 45.5 10/24/2018 0832   PLT 194 10/24/2018 0832   MCV 101.6 (H) 10/24/2018 0832   MCH 36.4 (H) 10/24/2018 0832   MCHC 35.8 10/24/2018 0832   RDW 14.9 10/24/2018 0832   LYMPHSABS 2.2 10/24/2018 0832   MONOABS 0.7 10/24/2018 0832   EOSABS 0.1 10/24/2018 0832   BASOSABS 0.1 10/24/2018 0832   HEPATIC Function Panel Recent Labs    10/23/18 1720  PROT 7.9   HEMOGLOBIN A1C No components found for: HGA1C,  MPG CARDIAC ENZYMES Lab Results  Component Value Date   TROPONINI <0.03 10/24/2018   TROPONINI 0.03 (HH) 10/23/2018   TROPONINI 0.04 (HH) 10/23/2018   BNP No results for input(s): PROBNP in the last 8760 hours. TSH No results for input(s): TSH in the last 8760 hours. CHOLESTEROL Recent Labs    10/24/18 0832  CHOL 177    Scheduled Meds: . aspirin EC  81 mg Oral Daily  . atorvastatin  40 mg Oral q1800  . LORazepam  0.5 mg Oral QHS  . metoprolol tartrate  25 mg Oral BID  . nicotine  21 mg Transdermal Daily  . regadenoson      . sodium chloride flush  3 mL Intravenous Once  Continuous Infusions: . sodium chloride    . sodium chloride 75 mL/hr at 10/24/18 1316  . heparin 1,600 Units/hr (10/24/18 1143)   PRN Meds:.sodium chloride, acetaminophen, nitroGLYCERIN, ondansetron (ZOFRAN) IV, sodium chloride flush  Assessment/Plan: Acute coronary syndrome Ischemic cardiomyopathy Tobacco use disorder HTN  R + L heart catheterization with possible angioplasty.    LOS: 1 day    Orpah Cobb  MD  10/24/2018, 2:02 PM

## 2018-10-25 ENCOUNTER — Inpatient Hospital Stay (HOSPITAL_COMMUNITY): Payer: Self-pay

## 2018-10-25 ENCOUNTER — Encounter (HOSPITAL_COMMUNITY): Payer: Self-pay | Admitting: Cardiovascular Disease

## 2018-10-25 LAB — BASIC METABOLIC PANEL
Anion gap: 11 (ref 5–15)
BUN: 20 mg/dL (ref 6–20)
CO2: 28 mmol/L (ref 22–32)
Calcium: 9.1 mg/dL (ref 8.9–10.3)
Chloride: 98 mmol/L (ref 98–111)
Creatinine, Ser: 0.96 mg/dL (ref 0.61–1.24)
GFR calc Af Amer: >60 mL/min (ref >60)
GFR calc non Af Amer: >60 mL/min (ref >60)
Glucose, Bld: 100 mg/dL — ABNORMAL HIGH (ref 70–99)
Potassium: 3.7 mmol/L (ref 3.5–5.1)
Sodium: 137 mmol/L (ref 135–145)

## 2018-10-25 LAB — CBC
HCT: 44.5 % (ref 39.0–52.0)
Hemoglobin: 16.1 g/dL (ref 13.0–17.0)
MCH: 37.5 pg — ABNORMAL HIGH (ref 26.0–34.0)
MCHC: 36.2 g/dL — ABNORMAL HIGH (ref 30.0–36.0)
MCV: 103.7 fL — ABNORMAL HIGH (ref 80.0–100.0)
Platelets: 170 10*3/uL (ref 150–400)
RBC: 4.29 MIL/uL (ref 4.22–5.81)
RDW: 15.1 % (ref 11.5–15.5)
WBC: 8.7 10*3/uL (ref 4.0–10.5)
nRBC: 0 % (ref 0.0–0.2)

## 2018-10-25 LAB — ECHOCARDIOGRAM COMPLETE
Height: 69 in
Weight: 3108.8 oz

## 2018-10-25 MED ORDER — LORAZEPAM 0.5 MG PO TABS: 0.5000 mg | ORAL_TABLET | Freq: Every evening | ORAL | 0 refills | Status: DC | PRN

## 2018-10-25 MED ORDER — LOSARTAN POTASSIUM 25 MG PO TABS: 25.0000 mg | ORAL_TABLET | Freq: Every day | ORAL | 6 refills | Status: DC

## 2018-10-25 MED ORDER — METOPROLOL TARTRATE 25 MG PO TABS: 25.0000 mg | ORAL_TABLET | Freq: Two times a day (BID) | ORAL | 6 refills | Status: DC

## 2018-10-25 MED ORDER — ASPIRIN 81 MG PO TBEC: 81.0000 mg | DELAYED_RELEASE_TABLET | Freq: Every day | ORAL | Status: DC

## 2018-10-25 MED ORDER — LOSARTAN POTASSIUM 25 MG PO TABS: 25.0000 mg | ORAL_TABLET | Freq: Every day | ORAL | Status: DC

## 2018-10-25 MED ORDER — NICOTINE 21 MG/24HR TD PT24: 21.0000 mg | MEDICATED_PATCH | Freq: Every day | TRANSDERMAL | 0 refills | Status: DC

## 2018-10-25 MED ORDER — ATORVASTATIN CALCIUM 40 MG PO TABS: 40.0000 mg | ORAL_TABLET | Freq: Every day | ORAL | 3 refills | Status: DC

## 2018-10-25 MED FILL — LORazepam 0.5 MG TABS: 0.5 | 30 days supply | Qty: 30 | Fill #0

## 2018-10-25 MED FILL — ATORVASTATIN CALCIUM 40 MG: 40 | 30 days supply | Qty: 30 | Fill #0 | Status: TO

## 2018-10-25 MED FILL — METOPROLOL TARTRATE 25 MG T: 25 | 30 days supply | Qty: 60 | Fill #0 | Status: TO

## 2018-10-25 MED FILL — LOSARTAN POTASSIUM 25 MG TA: 25 | 30 days supply | Qty: 30 | Fill #0 | Status: TO

## 2018-10-25 NOTE — Discharge Instructions (Signed)

## 2018-10-25 NOTE — Progress Notes (Signed)
  Echocardiogram 2D Echocardiogram has been performed.  Pieter Partridge 10/25/2018, 9:11 AM

## 2018-10-25 NOTE — TOC Initial Note (Signed)
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 At discharge Attending MD please send scripts to the [facility] pharmacy and they will be delivered to the patient prior to dc home.  Expected Discharge Plan: Home/Self Care Barriers to Discharge: No Barriers Identified   Patient Goals and CMS Choice Patient states their goals for this hospitalization and ongoing recovery are:: to get better CMS Medicare.gov Compare Post Acute Care list provided to:: Patient Choice offered to / list presented to : NA  Expected Discharge Plan and Services Expected Discharge Plan: Home/Self Care In-house Referral: Financial Counselor Discharge Planning Services: CM Consult Post Acute Care Choice: NA Living arrangements for the past 2 months: Single Family Home Expected Discharge Date: 10/25/18               DME Arranged: N/A   HH Arranged: NA HH Agency: NA  Prior Living Arrangements/Services Living arrangements for the past 2 months: Single Family Home Lives with:: Self Patient language and need for interpreter reviewed:: No Do you feel safe going back to the place where you live?: Yes      Need for Family Participation in Patient Care: No (Comment) Care giver support system in place?: Yes (comment)   Criminal Activity/Legal Involvement Pertinent to Current Situation/Hospitalization: No - Comment as needed  Activities of Daily Living Home Assistive Devices/Equipment: None ADL Screening (condition at time of admission) Patient's cognitive ability adequate to safely complete daily activities?: Yes Is the patient deaf or have difficulty  hearing?: No Does the patient have difficulty seeing, even when wearing glasses/contacts?: No Does the patient have difficulty concentrating, remembering, or making decisions?: No Patient able to express need for assistance with ADLs?: Yes Does the patient have difficulty dressing or bathing?: No Independently performs ADLs?: Yes (appropriate for developmental age) Does the patient have difficulty walking or climbing stairs?: No Weakness of Legs: None Weakness of Arms/Hands: None  Permission Sought/Granted Permission sought to share information with : Case Manager Permission granted to share information with : Yes, Verbal Permission Granted  Share Information with NAME: Mother           Emotional Assessment Appearance:: Developmentally appropriate Attitude/Demeanor/Rapport: Gracious, Engaged Affect (typically observed): Accepting Orientation: : Oriented to Self, Oriented to  Time, Oriented to Place, Oriented to Situation Alcohol / Substance Use: Not Applicable Psych Involvement: No (comment)  Admission diagnosis:  NSTEMI (non-ST elevated myocardial infarction) [facility]) [I21.4] Patient Active Problem List   Diagnosis Date Noted  . Acute coronary syndrome (HCC) 10/23/2018   PCP:  Patient, No Pcp Per Pharmacy:   [id number] - [place], [place] - [address] [facility] [place] [address] Phone: [phone] Fax: [phone]     Social Determinants of Health (SDOH) Interventions    Readmission Risk Interventions No flowsheet data found.

## 2018-10-25 NOTE — Discharge Summary (Signed)
Physician Discharge Summary  Patient ID: Jesus Fuller MRN: [id number] DOB/AGE: [birth year] 43 y.o.  Admit date: 10/23/2018 Discharge date: 10/25/2018  Admission Diagnoses: Acute coronary syndrome Hypertension Tobacco use disorder  Discharge Diagnoses:  Principle problem: Non-ischemic cardiomyopathy Active Problems:   Microvascular coronary artery disease   Hypertension   Tobacco use disorder   Generalized anxiety   Vasovagal syncope  Discharged Condition: fair  Hospital Course: 43 year old male with hypertension and tobacco use disorder has recurrent chest pain. His Troponin I was minimally elevated and his NM myocardial perfusion test was abnormal but his cardiac catheterization showed normal coronaries. His echocardiogram showed global hypokinesia with EF 30-40 %.  He was treated with Losartan, Metoprolol, Lorazepam, Nicotine patch, Aspirin and Atorvastatin. He had minimal bruise over right groin cath site due to premature movement prior to 4 hour bed rest requiring 15-20 minutes of compression to achieve hemostasis. He ambulated this AM well. He understood life style modifications, smoking cessation and taking medications regularly. He will also decrease sweet food intake for borderline hyperglycemia. He will see me in 1 week.  Consults: cardiology  Significant Diagnostic Studies: labs: Near normal electrolytes with BUN of 21 and creatinine of 1.40. Subsequent creatine was normal. CBC was near normal with mild macrocytosis of RBCs. Troponin I was minimally elevated. EKG : Sinus tachycardia. Chest x-ray: Unremarkable. NM myocardial perfusion test: Inferoapical attenuation v/s scar. Cardiac cath: Normal coronaries.  Treatments: cardiac meds: metoprolol, aspirin, losartan and Atorvastatin. Nicotine patch, Lorazepam and smoking cessation.  Discharge Exam: Blood pressure (!) 130/92, pulse 71, temperature 97.8 F (36.6 C), temperature source Oral, resp. rate 16, height 5\' 9"   (1.753 m), weight 88.1 kg, SpO2 98 %. General appearance: alert, cooperative and appears stated age. Head: Normocephalic, atraumatic. Eyes: pink conjunctiva, corneas clear. PERRL, EOM's intact.  Neck: No adenopathy, no carotid bruit, no JVD, supple, symmetrical, trachea midline and thyroid not enlarged. Resp: Clear to auscultation bilaterally. Cardio: Regular rate and rhythm, S1, S2 normal, II/VI systolic murmur, no click, rub or gallop. GI: Soft, non-tender; bowel sounds normal; no organomegaly. Extremities: No edema, cyanosis or clubbing. Small right groin bruise. Skin: Warm and dry.  Neurologic: Alert and oriented X 3, normal strength and tone. Normal coordination and gait.  Disposition: Discharge disposition: 01-Home or Self Care        Allergies as of 10/25/2018   No Known Allergies     Medication List    STOP taking these medications   aspirin 325 MG tablet Replaced by:  aspirin 81 MG EC tablet   hydrochlorothiazide 12.5 MG tablet Commonly known as:  HYDRODIURIL     TAKE these medications   aspirin 81 MG EC tablet Take 1 tablet (81 mg total) by mouth daily. Start taking on:  October 26, 2018 Replaces:  aspirin 325 MG tablet   atorvastatin 40 MG tablet Commonly known as:  LIPITOR Take 1 tablet (40 mg total) by mouth daily at 6 PM.   LORazepam 0.5 MG tablet Commonly known as:  ATIVAN Take 1 tablet (0.5 mg total) by mouth at bedtime as needed for anxiety.   losartan 25 MG tablet Commonly known as:  COZAAR Take 1 tablet (25 mg total) by mouth daily.   metoprolol tartrate 25 MG tablet Commonly known as:  LOPRESSOR Take 1 tablet (25 mg total) by mouth 2 (two) times daily.   nicotine 21 mg/24hr patch Commonly known as:  NICODERM CQ - dosed in mg/24 hours Place 1 patch (21 mg total) onto the  skin daily. Start taking on:  October 26, 2018      Follow-up Information    Orpah Cobb, MD. Call in 1 week(s).   Specialty:  Cardiology Contact information: [address] [place] [address] [phone]           Signed: Ricki Rodriguez 10/25/2018, 11:49 AM

## 2018-10-25 NOTE — Plan of Care (Signed)
Nutrition Education Note  RD working remotely.   RD consulted for nutrition education regarding low sodium diet.   Spoke with pt on phone, who was pleasant and in good spirits. He reports feeling much better since coming to the hospital. He shares he he has experienced a decreased appetite over the past 2-3 months, but became a lot worse over the past 2 weeks. Pt reports he would intermittently get indigestion, but didn't much of it, however, this progressed to vomiting every morning for 2 weeks PTA. Pt shares he "ate literally nothing-maybe one meal per day" during this time period. At baseline he has a good appetite, consuming 2-3 meals per day, such as [place] sandwiches. He is very forth coming about the use of salt in his diet, but determined to stop.   Pt shared his UBW is around 190-200#. HE estimates he lost 10# over the past 2 weeks, however, this is not consistent with wt records.   Pt very eager to change and reports discharge today (discharge order put in when RD was on phone with pt). Reviewed low sodium diet with pt per his request.   RD provided "Low Sodium Nutrition Therapy" and "Sodium Free Flavoring Tips" handouts from the Academy of Nutrition and Dietetics (these were provided in discharge summary and pt aware of this) Reviewed patient's dietary recall. Provided examples on ways to decrease sodium intake in diet. Discouraged intake of processed foods and use of salt shaker. Encouraged fresh fruits and vegetables as well as whole grain sources of carbohydrates to maximize fiber intake.   RD discussed why it is important for patient to adhere to diet recommendations. Teach back method used.  Expect fair to good compliance.  Body mass index is 28.69 kg/m. Pt meets criteria for overweight based on current BMI.  Current diet order is 2 grams sodium, patient is consuming approximately 100% of meals at this time. Labs and medications reviewed. No further nutrition interventions  warranted at this time. RD contact information provided. If additional nutrition issues arise, please re-consult RD.   Thomas Rhude A. Mayford Knife, RD, LDN, CDCES Registered Dietitian II Certified Diabetes Care and Education Specialist Pager: [phone] After hours Pager: [phone]

## 2018-11-19 ENCOUNTER — Encounter (HOSPITAL_COMMUNITY): Payer: Self-pay

## 2018-11-19 ENCOUNTER — Emergency Department (HOSPITAL_COMMUNITY): Payer: No Typology Code available for payment source

## 2018-11-19 ENCOUNTER — Other Ambulatory Visit: Payer: Self-pay

## 2018-11-19 ENCOUNTER — Emergency Department (HOSPITAL_COMMUNITY)
Admission: EM | Admit: 2018-11-19 | Discharge: 2018-11-19 | Disposition: A | Discharge status: Home / Self Care | Payer: No Typology Code available for payment source | Attending: Emergency Medicine | Admitting: Emergency Medicine

## 2018-11-19 DIAGNOSIS — F17210 Nicotine dependence, cigarettes, uncomplicated: Secondary | ICD-10-CM | POA: Diagnosis not present

## 2018-11-19 DIAGNOSIS — Z7982 Long term (current) use of aspirin: Secondary | ICD-10-CM | POA: Diagnosis not present

## 2018-11-19 DIAGNOSIS — R51 Headache: Secondary | ICD-10-CM | POA: Diagnosis not present

## 2018-11-19 DIAGNOSIS — J45909 Unspecified asthma, uncomplicated: Secondary | ICD-10-CM | POA: Diagnosis not present

## 2018-11-19 DIAGNOSIS — R410 Disorientation, unspecified: Secondary | ICD-10-CM | POA: Diagnosis present

## 2018-11-19 DIAGNOSIS — R519 Headache, unspecified: Secondary | ICD-10-CM

## 2018-11-19 DIAGNOSIS — Z79899 Other long term (current) drug therapy: Secondary | ICD-10-CM | POA: Diagnosis not present

## 2018-11-19 DIAGNOSIS — I10 Essential (primary) hypertension: Secondary | ICD-10-CM | POA: Diagnosis not present

## 2018-11-19 NOTE — ED Notes (Signed)
Pt transported to CT ?
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
## 2018-11-19 NOTE — ED Notes (Signed)
Pt back from CT

## 2018-11-19 NOTE — Discharge Instructions (Signed)
Please read attached information. If you experience any new or worsening signs or symptoms please return to the emergency room for evaluation. Please follow-up with your primary care provider or specialist as discussed. Please use medication prescribed only as directed and discontinue taking if you have any concerning signs or symptoms.   °
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
## 2019-02-20 ENCOUNTER — Other Ambulatory Visit: Payer: Self-pay

## 2019-02-20 ENCOUNTER — Encounter (HOSPITAL_COMMUNITY): Payer: Self-pay

## 2019-02-20 ENCOUNTER — Emergency Department (HOSPITAL_COMMUNITY)
Admission: EM | Admit: 2019-02-20 | Discharge: 2019-02-20 | Disposition: A | Discharge status: Home / Self Care | Payer: Self-pay | Attending: Emergency Medicine | Admitting: Emergency Medicine

## 2019-02-20 ENCOUNTER — Emergency Department (HOSPITAL_COMMUNITY): Payer: Self-pay

## 2019-02-20 DIAGNOSIS — S62307A Unspecified fracture of fifth metacarpal bone, left hand, initial encounter for closed fracture: Secondary | ICD-10-CM | POA: Insufficient documentation

## 2019-02-20 DIAGNOSIS — Y929 Unspecified place or not applicable: Secondary | ICD-10-CM | POA: Insufficient documentation

## 2019-02-20 DIAGNOSIS — Z7982 Long term (current) use of aspirin: Secondary | ICD-10-CM | POA: Insufficient documentation

## 2019-02-20 DIAGNOSIS — F17210 Nicotine dependence, cigarettes, uncomplicated: Secondary | ICD-10-CM | POA: Insufficient documentation

## 2019-02-20 DIAGNOSIS — Z79899 Other long term (current) drug therapy: Secondary | ICD-10-CM | POA: Insufficient documentation

## 2019-02-20 DIAGNOSIS — I10 Essential (primary) hypertension: Secondary | ICD-10-CM | POA: Insufficient documentation

## 2019-02-20 DIAGNOSIS — Y9389 Activity, other specified: Secondary | ICD-10-CM | POA: Insufficient documentation

## 2019-02-20 DIAGNOSIS — Y999 Unspecified external cause status: Secondary | ICD-10-CM | POA: Insufficient documentation

## 2019-02-20 DIAGNOSIS — W208XXA Other cause of strike by thrown, projected or falling object, initial encounter: Secondary | ICD-10-CM | POA: Insufficient documentation

## 2019-02-20 DIAGNOSIS — J45909 Unspecified asthma, uncomplicated: Secondary | ICD-10-CM | POA: Insufficient documentation

## 2019-02-20 MED ORDER — OXYCODONE-ACETAMINOPHEN 5-325 MG PO TABS: 1.0000 | ORAL_TABLET | Freq: Four times a day (QID) | ORAL | 0 refills | Status: DC | PRN

## 2019-02-20 MED ORDER — ACETAMINOPHEN 325 MG PO TABS
650.0000 mg | ORAL_TABLET | Freq: Once | ORAL | Status: AC
  Administered 2019-02-20: 19:00:00 650 mg via ORAL
  Filled 2019-02-20: qty 2

## 2019-02-20 NOTE — Discharge Instructions (Addendum)
You were seen today for pain in your left hand. Please read and follow all provided instructions.   Your xray shows acute fifth metacarpal neck fracture with mild dorsal apex angulation   1. Medications: alternate ibuprofen and tylenol for pain control. -For severe pain a prescription has been sent to your pharmacy for oxycodone. Please take this as prescribed and do not drive or work when taking this. This has tylenol in it so please do not take tylenol when taking this medication.  2. Treatment: rest, ice, elevate and use brace, drink plenty of fluids, gentle stretching  3. Follow Up: Please followup with orthopedics as directed or your PCP in 1 week if no improvement for discussion of your diagnoses and further evaluation after today's visit; if you do not have a primary care doctor use the resource guide provided to find one; Please return to the ER for worsening symptoms or other concerns  Information has been included for [facility].  You can call to follow-up if needed as we discussed.

## 2019-02-20 NOTE — ED Triage Notes (Signed)
He states he struck his left hand while working on his car ~10 days ago. He has swelling at post. Left hand at distal metacarpals area. He is in no distress.

## 2019-02-20 NOTE — ED Notes (Signed)
ED Provider at bedside. 

## 2019-02-20 NOTE — ED Provider Notes (Signed)
[facility] DEPT Provider Note   CSN: [id number] Arrival date & time: 02/20/19  1633    History   Chief Complaint Chief Complaint  Patient presents with   Hand Injury    HPI Jesus Fuller is a 43 y.o. male has medical history of asthma and hypertension presents emergency department today with chief complaint of left hand pain. This has been going on x10 days.  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  He got the hand which is swollen however he has had increasing pain.  States the swelling is actually improved since the injury.  He has difficulty moving his pinky.  He describes the pain as a constant ache.  Pain is worse with movement.  He rates it 10 out of 10 in severity.  He has been taking aspirin with minimal pain relief.  Pt is left-hand dominant.  Denies fever, chills, numbness, weakness, tingling.  History provided by patient with additional history obtained from chart review.     Past Medical History:  Diagnosis Date   Asthma    Hypertension    Takes no medicine    Patient Active Problem List   Diagnosis Date Noted   Acute coronary syndrome ([place]) 10/23/2018    Past Surgical History:  Procedure Laterality Date   RIGHT/LEFT HEART CATH AND CORONARY ANGIOGRAPHY N/A 10/24/2018   Procedure: RIGHT/LEFT HEART CATH AND CORONARY ANGIOGRAPHY;  Surgeon: Dixie Dials, MD;  Location: [facility];  Service: Cardiovascular;  Laterality: N/A;        Home Medications    Prior to Admission medications   Medication Sig Start Date End Date Taking? Authorizing Provider  aspirin EC 81 MG EC tablet Take 1 tablet (81 mg total) by mouth daily. 10/26/18   Dixie Dials, MD  atorvastatin (LIPITOR) 40 MG tablet Take 1 tablet (40 mg total) by mouth daily at 6 PM. 10/25/18   Dixie Dials, MD  LORazepam (ATIVAN) 0.5 MG tablet Take 1 tablet (0.5 mg total) by  mouth at bedtime as needed for anxiety. 10/25/18   Dixie Dials, MD  losartan (COZAAR) 25 MG tablet Take 1 tablet (25 mg total) by mouth daily. 10/25/18   Dixie Dials, MD  metoprolol tartrate (LOPRESSOR) 25 MG tablet Take 1 tablet (25 mg total) by mouth 2 (two) times daily. 10/25/18   Dixie Dials, MD  nicotine (NICODERM CQ - DOSED IN MG/24 HOURS) 21 mg/24hr patch Place 1 patch (21 mg total) onto the skin daily. 10/26/18   Dixie Dials, MD  oxyCODONE-acetaminophen (PERCOCET/ROXICET) 5-325 MG tablet Take 1 tablet by mouth every 6 (six) hours as needed for severe pain. 02/20/19   Devonne Kitchen, Harley Hallmark, PA-C    Family History Family History  Problem Relation Age of Onset   Cancer Mother    COPD Mother    Diabetes Father     Social History Social History   Tobacco Use   Smoking status: Current Every Day Smoker    Packs/day: 1.50    Types: Cigarettes   Smokeless tobacco: Never Used  Substance Use Topics   Alcohol use: Yes    Comment: occasional   Drug use: No     Allergies   Patient has no known allergies.   Review of Systems Review of Systems  Constitutional: Negative for fever.  Musculoskeletal: Positive for arthralgias and joint swelling.  Skin: Negative for  color change and rash.  Neurological: Negative for weakness and numbness.     Physical Exam Updated Vital Signs BP 139/89 (BP Location: Left Arm)    Pulse 62    Temp 99.1 F (37.3 C) (Oral)    Resp 15    SpO2 96%   Physical Exam Vitals signs and nursing note reviewed.  Constitutional:      Appearance: He is well-developed. He is not ill-appearing or toxic-appearing.  HENT:     Head: Normocephalic and atraumatic.     Nose: Nose normal.  Eyes:     General: No scleral icterus.       Right eye: No discharge.        Left eye: No discharge.     Conjunctiva/sclera: Conjunctivae normal.  Neck:     Musculoskeletal: Normal range of motion.     Vascular: No JVD.  Cardiovascular:     Rate and Rhythm: Normal rate  and regular rhythm.     Pulses: Normal pulses.     Heart sounds: Normal heart sounds.  Pulmonary:     Effort: Pulmonary effort is normal.     Breath sounds: Normal breath sounds.  Abdominal:     General: There is no distension.  Musculoskeletal: Normal range of motion.     Left elbow: Normal.     Left wrist: Normal.     Comments: Left hand with tenderness to palpation of dorsal aspect of 5th metacarpal with mild swelling. There is no joint effusion noted. Decreased ROM 2/2 pain. No erythema or warmth overlaying the joint. There is no anatomic snuff box tenderness. Normal sensation and motor function in the median, ulnar, and radial nerve distributions. 2+ radial pulse. Neurovascularly intact distally. Compartments soft above and below affected joint.     Skin:    General: Skin is warm and dry.  Neurological:     Mental Status: He is oriented to person, place, and time.     GCS: GCS eye subscore is 4. GCS verbal subscore is 5. GCS motor subscore is 6.     Comments: Fluent speech, no facial droop.  Psychiatric:        Behavior: Behavior normal.      ED Treatments / Results  Labs (all labs ordered are listed, but only abnormal results are displayed) Labs Reviewed - No data to display  EKG None  Radiology Dg Hand Complete Left  Result Date: 02/20/2019 CLINICAL DATA:  Hand pain and swelling EXAM: LEFT HAND - COMPLETE 3+ VIEW COMPARISON:  01/26/2013 FINDINGS: There is an acute fracture of the fifth metacarpal neck with mild dorsal apex angulation. Soft tissue swelling overlies the fracture site. No additional fractures are identified. No dislocation. Joint spaces are preserved. No radiopaque foreign body. IMPRESSION: Acute fifth metacarpal neck fracture with mild dorsal apex angulation. Electronically Signed   By: Duanne Guess M.D.   On: 02/20/2019 17:27    Procedures Procedures (including critical care time)  Medications Ordered in ED Medications  acetaminophen (TYLENOL)  tablet 650 mg (650 mg Oral Given 02/20/19 1834)     Initial Impression / Assessment and Plan / ED Course  I have reviewed the triage vital signs and the nursing notes.  Pertinent labs & imaging results that were available during my care of the patient were reviewed by me and considered in my medical decision making (see chart for details).   Patient X-Ray with fracture of 5th metacarpal viewed by myself and attending Dr. Particia Nearing. On exam there is swelling  to dorsal aspect of left 5th metacarpal. Neurovascularly intact distally. Compartments soft above and below affected joint.  Pain managed in ED. Ulnar gutter splint applied.  Sensation is intact after sling application, capillary fill less than 2 seconds and able to move all fingers.  Pt advised to follow up with orthopedics for further evaluation and treatment.  Pain managed in the department. Patient given ice and tylenol  while in ED, conservative therapy recommended and discussed. Patient will be dc home with short course of pain medication & is agreeable with above plan. PDMP reviewed during this encounter. Pt without recent narcotic prescription. I have also discussed reasons to return immediately to the ER.  Patient expresses understanding and agrees with plan.  This note was prepared using Dragon voice recognition software and may include unintentional dictation errors due to the inherent limitations of voice recognition software.    Final Clinical Impressions(s) / ED Diagnoses   Final diagnoses:  Closed nondisplaced fracture of fifth metacarpal bone of left hand, unspecified portion of metacarpal, initial encounter    ED Discharge Orders         Ordered    oxyCODONE-acetaminophen (PERCOCET/ROXICET) 5-325 MG tablet  Every 6 hours PRN     02/20/19 1849           Kathyrn Lasslbrizze, Kron Everton E, PA-C 02/20/19 1850    Jacalyn LefevreHaviland, Julie, MD 02/20/19 Windell [id number]

## 2019-03-27 ENCOUNTER — Other Ambulatory Visit: Payer: Self-pay

## 2019-03-27 ENCOUNTER — Encounter (HOSPITAL_COMMUNITY): Payer: Self-pay

## 2019-03-27 ENCOUNTER — Emergency Department (HOSPITAL_COMMUNITY)
Admission: EM | Admit: 2019-03-27 | Discharge: 2019-03-27 | Disposition: A | Discharge status: Home / Self Care | Payer: Self-pay | Attending: Emergency Medicine | Admitting: Emergency Medicine

## 2019-03-27 DIAGNOSIS — J45909 Unspecified asthma, uncomplicated: Secondary | ICD-10-CM | POA: Insufficient documentation

## 2019-03-27 DIAGNOSIS — F10930 Alcohol use, unspecified with withdrawal, uncomplicated: Secondary | ICD-10-CM

## 2019-03-27 DIAGNOSIS — F17210 Nicotine dependence, cigarettes, uncomplicated: Secondary | ICD-10-CM | POA: Insufficient documentation

## 2019-03-27 DIAGNOSIS — F10230 Alcohol dependence with withdrawal, uncomplicated: Secondary | ICD-10-CM | POA: Insufficient documentation

## 2019-03-27 DIAGNOSIS — Z79899 Other long term (current) drug therapy: Secondary | ICD-10-CM | POA: Insufficient documentation

## 2019-03-27 DIAGNOSIS — R197 Diarrhea, unspecified: Secondary | ICD-10-CM | POA: Insufficient documentation

## 2019-03-27 DIAGNOSIS — Z7982 Long term (current) use of aspirin: Secondary | ICD-10-CM | POA: Insufficient documentation

## 2019-03-27 DIAGNOSIS — I10 Essential (primary) hypertension: Secondary | ICD-10-CM | POA: Insufficient documentation

## 2019-03-27 DIAGNOSIS — R112 Nausea with vomiting, unspecified: Secondary | ICD-10-CM | POA: Insufficient documentation

## 2019-03-27 LAB — URINALYSIS, ROUTINE W REFLEX MICROSCOPIC
Glucose, UA: NEGATIVE mg/dL
Ketones, ur: 5 mg/dL — AB
Nitrite: NEGATIVE
Protein, ur: 100 mg/dL — AB
Specific Gravity, Urine: 1.041 — ABNORMAL HIGH (ref 1.005–1.030)
pH: 5 (ref 5.0–8.0)

## 2019-03-27 LAB — COMPREHENSIVE METABOLIC PANEL
ALT: 33 U/L (ref 0–44)
AST: 35 U/L (ref 15–41)
Albumin: 4.3 g/dL (ref 3.5–5.0)
Alkaline Phosphatase: 76 U/L (ref 38–126)
Anion gap: 10 (ref 5–15)
BUN: 11 mg/dL (ref 6–20)
CO2: 26 mmol/L (ref 22–32)
Calcium: 9.7 mg/dL (ref 8.9–10.3)
Chloride: 99 mmol/L (ref 98–111)
Creatinine, Ser: 1.12 mg/dL (ref 0.61–1.24)
GFR calc Af Amer: >60 mL/min (ref >60)
GFR calc non Af Amer: >60 mL/min (ref >60)
Glucose, Bld: 106 mg/dL — ABNORMAL HIGH (ref 70–99)
Potassium: 3.7 mmol/L (ref 3.5–5.1)
Sodium: 135 mmol/L (ref 135–145)
Total Bilirubin: 0.7 mg/dL (ref 0.3–1.2)
Total Protein: 7.8 g/dL (ref 6.5–8.1)

## 2019-03-27 LAB — CBC WITH DIFFERENTIAL/PLATELET
Abs Immature Granulocytes: 0.02 10*3/uL (ref 0.00–0.07)
Basophils Absolute: 0 10*3/uL (ref 0.0–0.1)
Basophils Relative: 0 %
Eosinophils Absolute: 0 10*3/uL (ref 0.0–0.5)
Eosinophils Relative: 0 %
HCT: 44.6 % (ref 39.0–52.0)
Hemoglobin: 15.6 g/dL (ref 13.0–17.0)
Immature Granulocytes: 0 %
Lymphocytes Relative: 8 %
Lymphs Abs: 0.7 10*3/uL (ref 0.7–4.0)
MCH: 38.4 pg — ABNORMAL HIGH (ref 26.0–34.0)
MCHC: 35 g/dL (ref 30.0–36.0)
MCV: 109.9 fL — ABNORMAL HIGH (ref 80.0–100.0)
Monocytes Absolute: 0.6 10*3/uL (ref 0.1–1.0)
Monocytes Relative: 7 %
Neutro Abs: 7.1 10*3/uL (ref 1.7–7.7)
Neutrophils Relative %: 85 %
Platelets: 159 10*3/uL (ref 150–400)
RBC: 4.06 MIL/uL — ABNORMAL LOW (ref 4.22–5.81)
RDW: 15 % (ref 11.5–15.5)
WBC: 8.4 10*3/uL (ref 4.0–10.5)
nRBC: 0 % (ref 0.0–0.2)

## 2019-03-27 LAB — LIPASE, BLOOD: Lipase: 28 U/L (ref 11–51)

## 2019-03-27 MED ORDER — SODIUM CHLORIDE 0.9 % IV BOLUS
1000.0000 mL | Freq: Once | INTRAVENOUS | Status: AC
  Administered 2019-03-27: 1000 mL via INTRAVENOUS

## 2019-03-27 MED ORDER — FAMOTIDINE 20 MG PO TABS: 20.0000 mg | ORAL_TABLET | Freq: Two times a day (BID) | ORAL | 0 refills | Status: DC

## 2019-03-27 MED ORDER — OMEPRAZOLE 20 MG PO CPDR: 20.0000 mg | DELAYED_RELEASE_CAPSULE | Freq: Every day | ORAL | 0 refills | Status: DC

## 2019-03-27 MED ORDER — LORAZEPAM 2 MG/ML IJ SOLN
0.5000 mg | Freq: Once | INTRAMUSCULAR | Status: DC
  Filled 2019-03-27: qty 1

## 2019-03-27 MED ORDER — CHLORDIAZEPOXIDE HCL 25 MG PO CAPS: ORAL_CAPSULE | ORAL | 0 refills | Status: DC

## 2019-03-27 MED ORDER — ONDANSETRON HCL 4 MG/2ML IJ SOLN
4.0000 mg | Freq: Once | INTRAMUSCULAR | Status: AC | PRN
  Administered 2019-03-27: 11:00:00 4 mg via INTRAVENOUS
  Filled 2019-03-27: qty 2

## 2019-03-27 MED ORDER — PROMETHAZINE HCL 25 MG PO TABS: 25.0000 mg | ORAL_TABLET | Freq: Four times a day (QID) | ORAL | 0 refills | Status: DC | PRN

## 2019-03-27 MED ORDER — SUCRALFATE 1 G PO TABS: 1.0000 g | ORAL_TABLET | Freq: Three times a day (TID) | ORAL | 0 refills | Status: DC

## 2019-03-27 NOTE — ED Provider Notes (Signed)
[facility]-EMERGENCY DEPT Provider Note   CSN: [id number] Arrival date & time: 03/27/19  6063     History   Chief Complaint No chief complaint on file.   HPI Jesus Fuller is a 43 y.o. male.     Patient with history of cardiomyopathy presents to the emergency department today with complaint of nausea, vomiting, diarrhea.  Patient endorses heavy alcohol use for about 2 years.  States that he likely drank "1/5" per day.  He has cut down over the past 3 weeks trying to stop drinking.  States that he is drinking about half of what he used to currently.  Patient states that he has had about 5 days of generalized abdominal cramping, sometimes worse in the lower abdomen, sometimes worse in the right upper quadrant.  He has significant nausea and vomiting and has been having difficulty keeping fluids and solids down.  He also has frequent watery diarrhea.  Patient noted blood in his stool about 6 days ago without recurrence.  No hematemesis.  Reports decreased urination.  He tried an antacid and omeprazole without any improvement.  Denies heavy NSAID use.  Denies history of other abdominal surgeries.  No chest pain or shortness of breath.  Denies fever (temperature 100.2 F on arrival today).  He denies any sick contacts or suspicious food/water exposures.     Past Medical History:  Diagnosis Date  . Asthma   . Hypertension    Takes no medicine    Patient Active Problem List   Diagnosis Date Noted  . Acute coronary syndrome (HCC) 10/23/2018    Past Surgical History:  Procedure Laterality Date  . RIGHT/LEFT HEART CATH AND CORONARY ANGIOGRAPHY N/A 10/24/2018   Procedure: RIGHT/LEFT HEART CATH AND CORONARY ANGIOGRAPHY;  Surgeon: Orpah Cobb, MD;  Location: [facility];  Service: Cardiovascular;  Laterality: N/A;        Home Medications    Prior to Admission medications   Medication Sig Start Date End Date Taking? Authorizing Provider  aspirin EC 81 MG  EC tablet Take 1 tablet (81 mg total) by mouth daily. 10/26/18   Orpah Cobb, MD  atorvastatin (LIPITOR) 40 MG tablet Take 1 tablet (40 mg total) by mouth daily at 6 PM. 10/25/18   Orpah Cobb, MD  LORazepam (ATIVAN) 0.5 MG tablet Take 1 tablet (0.5 mg total) by mouth at bedtime as needed for anxiety. 10/25/18   Orpah Cobb, MD  losartan (COZAAR) 25 MG tablet Take 1 tablet (25 mg total) by mouth daily. 10/25/18   Orpah Cobb, MD  metoprolol tartrate (LOPRESSOR) 25 MG tablet Take 1 tablet (25 mg total) by mouth 2 (two) times daily. 10/25/18   Orpah Cobb, MD  nicotine (NICODERM CQ - DOSED IN MG/24 HOURS) 21 mg/24hr patch Place 1 patch (21 mg total) onto the skin daily. 10/26/18   Orpah Cobb, MD  oxyCODONE-acetaminophen (PERCOCET/ROXICET) 5-325 MG tablet Take 1 tablet by mouth every 6 (six) hours as needed for severe pain. 02/20/19   Albrizze, Caroleen Hamman, PA-C    Family History Family History  Problem Relation Age of Onset  . Cancer Mother   . COPD Mother   . Diabetes Father     Social History Social History   Tobacco Use  . Smoking status: Current Every Day Smoker    Packs/day: 1.50    Types: Cigarettes  . Smokeless tobacco: Never Used  Substance Use Topics  . Alcohol use: Yes    Comment: occasional  . Drug  use: No     Allergies   Patient has no known allergies.   Review of Systems Review of Systems  Constitutional: Negative for fever.  HENT: Negative for rhinorrhea and sore throat.   Eyes: Negative for redness.  Respiratory: Negative for cough and shortness of breath.   Cardiovascular: Negative for chest pain.  Gastrointestinal: Positive for diarrhea, nausea and vomiting. Negative for abdominal pain and blood in stool (not currently).  Genitourinary: Positive for decreased urine volume. Negative for dysuria and hematuria.  Musculoskeletal: Negative for myalgias.  Skin: Negative for rash.  Neurological: Negative for headaches.     Physical Exam Updated Vital Signs  BP (!) 135/97 (BP Location: Right Arm)   Pulse 84   Temp 100.2 F (37.9 C) (Oral)   Resp 16   SpO2 97%   Physical Exam Vitals signs and nursing note reviewed.  Constitutional:      Appearance: He is well-developed.  HENT:     Head: Normocephalic and atraumatic.  Eyes:     General:        Right eye: No discharge.        Left eye: No discharge.     Conjunctiva/sclera: Conjunctivae normal.  Neck:     Musculoskeletal: Normal range of motion and neck supple.  Cardiovascular:     Rate and Rhythm: Normal rate and regular rhythm.     Heart sounds: Normal heart sounds.  Pulmonary:     Effort: Pulmonary effort is normal.     Breath sounds: Normal breath sounds.  Abdominal:     Palpations: Abdomen is soft.     Tenderness: There is abdominal tenderness. There is no guarding or rebound.     Comments: Patient with mild generalized abdominal tenderness.  No focal pain, rebound or guarding.  Skin:    General: Skin is warm and dry.  Neurological:     Mental Status: He is alert.      ED Treatments / Results  Labs (all labs ordered are listed, but only abnormal results are displayed) Labs Reviewed  CBC WITH DIFFERENTIAL/PLATELET - Abnormal; Notable for the following components:      Result Value   RBC 4.06 (*)    MCV 109.9 (*)    MCH 38.4 (*)    All other components within normal limits  COMPREHENSIVE METABOLIC PANEL - Abnormal; Notable for the following components:   Glucose, Bld 106 (*)    All other components within normal limits  URINALYSIS, ROUTINE W REFLEX MICROSCOPIC - Abnormal; Notable for the following components:   Color, Urine AMBER (*)    APPearance CLOUDY (*)    Specific Gravity, Urine 1.041 (*)    Hgb urine dipstick SMALL (*)    Bilirubin Urine MODERATE (*)    Ketones, ur 5 (*)    Protein, ur 100 (*)    Leukocytes,Ua TRACE (*)    Bacteria, UA FEW (*)    All other components within normal limits  LIPASE, BLOOD    EKG None  Radiology No results found.   Procedures Procedures (including critical care time)  Medications Ordered in ED Medications  LORazepam (ATIVAN) injection 0.5 mg (has no administration in time range)  sodium chloride 0.9 % bolus 1,000 mL (1,000 mLs Intravenous New Bag/Given 03/27/19 1049)  sodium chloride 0.9 % bolus 1,000 mL (1,000 mLs Intravenous New Bag/Given 03/27/19 1049)  ondansetron (ZOFRAN) injection 4 mg (4 mg Intravenous Given 03/27/19 1048)     Initial Impression / Assessment and Plan / ED Course  I have reviewed the triage vital signs and the nursing notes.  Pertinent labs & imaging results that were available during my care of the patient were reviewed by me and considered in my medical decision making (see chart for details).        Patient seen and examined.  Patient looks uncomfortable but in no distress.  Will hydrate with IV fluids.  States that he is not currently nauseous.  Will check lab work, CIWA, UA.  No peritoneal signs to suggest perforated viscus.  Patient brought a small amount of urine back to the room that is dark brown.  Vital signs reviewed and are as follows: BP (!) 135/97 (BP Location: Right Arm)   Pulse 84   Temp 100.2 F (37.9 C) (Oral)   Resp 16   SpO2 97%   1:43 PM patient symptoms are controlled.  No focal tenderness on repeat abdominal exam.  Patient is no longer vomiting.  Patient was able to sleep.  Plan: Discharged home with Phenergan for nausea, Pepcid, Librium taper.  Will give 0.5mg  Ativan IV prior to discharge to help with any withdrawals.  CIWA score equals 8.  Patient will be discharged home.  The patient was urged to return to the Emergency Department immediately with worsening of current symptoms, worsening abdominal pain, persistent vomiting, blood noted in stools, fever, or any other concerns. The patient verbalized understanding.   We discussed use of benzodiazepine medications to help with alcohol withdrawal.  Discussed that he should not drink alcohol while  taking this medication as it can cause depression of breathing and possibly death.  Patient verbalizes understanding and agrees with plan.  Encourage PCP follow-up.  Final Clinical Impressions(s) / ED Diagnoses   Final diagnoses:  Nausea vomiting and diarrhea  Alcohol withdrawal syndrome without complication ([place])   Patient with nausea, vomiting, diarrhea, nonfocal abdominal pain.  Work-up today is reassuring with normal white blood cell count.  Electrolytes and liver function count are normal with normal kidney function.  UA suggestive of dehydration.  Patient was hydrated with 2 L of normal saline.  In addition, symptoms controlled with antiemetics.  Abdominal exam is unchanged.  I do not have any strong indications at the current time to perform advanced imaging.  Differential includes gastritis, peptic ulcer disease, alcohol withdrawal contributing to symptoms.  Low concern for intra-abdominal infection, perforation given lack of rebound or guarding or other peritoneal signs.  Patient appears well, nontoxic.  He seems reliable and trustworthy to take chlordiazepoxide prescription as discussed.  Strict return instructions as above.   ED Discharge Orders         Ordered    promethazine (PHENERGAN) 25 MG tablet  Every 6 hours PRN     03/27/19 1349    famotidine (PEPCID) 20 MG tablet  2 times daily     03/27/19 1349    chlordiazePOXIDE (LIBRIUM) 25 MG capsule     03/27/19 1349    sucralfate (CARAFATE) 1 g tablet  3 times daily with meals & bedtime     03/27/19 1349    omeprazole (PRILOSEC) 20 MG capsule  Daily     03/27/19 1349           Carlisle Cater, PA-C 03/27/19 1352    Daleen Bo, MD 03/27/19 [phone]

## 2019-03-27 NOTE — ED Triage Notes (Signed)
Pt c/o of N/V/D and abdominal pain (cramping) for a week.  Pt unable to keep anything down. Pt c/o of chills, and inability to sleep due to diarrhea at night.  Pt c/o of body aches.

## 2019-03-27 NOTE — ED Notes (Signed)
Pt states that about 3 weeks ago he started cutting back on how much alcohol he was drinking in attempt to begin to quit drinking.  Pt states he was having stomach issues then too.  Pt has not drank in about 3 days.

## 2019-03-27 NOTE — Discharge Instructions (Signed)
Please read and follow all provided instructions.  Your diagnoses today include:  1. Nausea vomiting and diarrhea   2. Alcohol withdrawal syndrome without complication (HCC)     Tests performed today include:  Blood counts and electrolytes  Blood tests to check liver and kidney function  Blood tests to check pancreas function  Urine test to look for infection - shows dehydration  Vital signs. See below for your results today.   Medications prescribed:   Pepcid (famotidine) - antihistamine  You can find this medication over-the-counter.   DO NOT exceed:   20mg  Pepcid every 12 hours   Phenergan (promethazine) - for nausea and vomiting   Librium -medication for alcohol withdrawal  Do not drink alcohol while taking this medication as you can depress your breathing.  Take any prescribed medications only as directed.  Home care instructions:   Follow any educational materials contained in this packet.   You should rest for the next several days. Keep drinking plenty of fluids and use the medicine for nausea as directed.    Drink clear liquids for the next 24 hours and introduce solid foods slowly after 24 hours using the b.r.a.t. diet (Bananas, Rice, Applesauce, Toast, Yogurt).    Follow-up instructions: Please follow-up with your primary care provider in the next 2 days for further evaluation of your symptoms. If you are not feeling better in 48 hours you may have a condition that is more serious and you need re-evaluation.   Return instructions:  SEEK IMMEDIATE MEDICAL ATTENTION IF:  If you have pain that does not go away or becomes severe   A temperature above 101F develops   Repeated vomiting occurs (multiple episodes)   If you have pain that becomes localized to portions of the abdomen. The right side could possibly be appendicitis. In an adult, the left lower portion of the abdomen could be colitis or diverticulitis.   Blood is being passed in stools or  vomit (bright red or black tarry stools)   You develop chest pain, difficulty breathing, dizziness or fainting, or become confused, poorly responsive, or inconsolable (young children)  If you have any other emergent concerns regarding your health  Additional Information: Abdominal (belly) pain can be caused by many things. Your caregiver performed an examination and possibly ordered blood/urine tests and imaging (CT scan, x-rays, ultrasound). Many cases can be observed and treated at home after initial evaluation in the emergency department. Even though you are being discharged home, abdominal pain can be unpredictable. Therefore, you need a repeated exam if your pain does not resolve, returns, or worsens. Most patients with abdominal pain don't have to be admitted to the hospital or have surgery, but serious problems like appendicitis and gallbladder attacks can start out as nonspecific pain. Many abdominal conditions cannot be diagnosed in one visit, so follow-up evaluations are very important.  Your vital signs today were: BP (!) 144/99    Pulse 76    Temp 98.7 F (37.1 C) (Oral)    Resp 16    SpO2 94%  If your blood pressure (bp) was elevated above 135/85 this visit, please have this repeated by your doctor within one month. --------------

## 2019-09-16 DIAGNOSIS — I82402 Acute embolism and thrombosis of unspecified deep veins of left lower extremity: Secondary | ICD-10-CM

## 2019-09-16 HISTORY — DX: Acute embolism and thrombosis of unspecified deep veins of left lower extremity: I82.402

## 2019-09-28 ENCOUNTER — Emergency Department (HOSPITAL_BASED_OUTPATIENT_CLINIC_OR_DEPARTMENT_OTHER): Payer: Self-pay

## 2019-09-28 ENCOUNTER — Emergency Department (HOSPITAL_COMMUNITY)
Admission: EM | Admit: 2019-09-28 | Discharge: 2019-09-28 | Disposition: A | Discharge status: Home / Self Care | Payer: Self-pay | Attending: Emergency Medicine | Admitting: Emergency Medicine

## 2019-09-28 ENCOUNTER — Other Ambulatory Visit: Payer: Self-pay

## 2019-09-28 ENCOUNTER — Encounter (HOSPITAL_COMMUNITY): Payer: Self-pay

## 2019-09-28 DIAGNOSIS — I2510 Atherosclerotic heart disease of native coronary artery without angina pectoris: Secondary | ICD-10-CM | POA: Insufficient documentation

## 2019-09-28 DIAGNOSIS — I824Z2 Acute embolism and thrombosis of unspecified deep veins of left distal lower extremity: Secondary | ICD-10-CM | POA: Insufficient documentation

## 2019-09-28 DIAGNOSIS — M79662 Pain in left lower leg: Secondary | ICD-10-CM | POA: Insufficient documentation

## 2019-09-28 DIAGNOSIS — M7989 Other specified soft tissue disorders: Secondary | ICD-10-CM

## 2019-09-28 DIAGNOSIS — I82402 Acute embolism and thrombosis of unspecified deep veins of left lower extremity: Secondary | ICD-10-CM

## 2019-09-28 DIAGNOSIS — I119 Hypertensive heart disease without heart failure: Secondary | ICD-10-CM | POA: Insufficient documentation

## 2019-09-28 DIAGNOSIS — M79609 Pain in unspecified limb: Secondary | ICD-10-CM

## 2019-09-28 DIAGNOSIS — E876 Hypokalemia: Secondary | ICD-10-CM | POA: Insufficient documentation

## 2019-09-28 DIAGNOSIS — Z79899 Other long term (current) drug therapy: Secondary | ICD-10-CM | POA: Insufficient documentation

## 2019-09-28 LAB — BASIC METABOLIC PANEL
Anion gap: 13 (ref 5–15)
BUN: 14 mg/dL (ref 6–20)
CO2: 28 mmol/L (ref 22–32)
Calcium: 9.3 mg/dL (ref 8.9–10.3)
Chloride: 100 mmol/L (ref 98–111)
Creatinine, Ser: 0.84 mg/dL (ref 0.61–1.24)
GFR calc Af Amer: >60 mL/min (ref >60)
GFR calc non Af Amer: >60 mL/min (ref >60)
Glucose, Bld: 106 mg/dL — ABNORMAL HIGH (ref 70–99)
Potassium: 3 mmol/L — ABNORMAL LOW (ref 3.5–5.1)
Sodium: 141 mmol/L (ref 135–145)

## 2019-09-28 LAB — CBC
HCT: 37.7 % — ABNORMAL LOW (ref 39.0–52.0)
Hemoglobin: 13.5 g/dL (ref 13.0–17.0)
MCH: 40.4 pg — ABNORMAL HIGH (ref 26.0–34.0)
MCHC: 35.8 g/dL (ref 30.0–36.0)
MCV: 112.9 fL — ABNORMAL HIGH (ref 80.0–100.0)
Platelets: 166 10*3/uL (ref 150–400)
RBC: 3.34 MIL/uL — ABNORMAL LOW (ref 4.22–5.81)
RDW: 13.3 % (ref 11.5–15.5)
WBC: 12.2 10*3/uL — ABNORMAL HIGH (ref 4.0–10.5)
nRBC: 0 % (ref 0.0–0.2)

## 2019-09-28 MED ORDER — POTASSIUM CHLORIDE ER 10 MEQ PO TBCR: 10.0000 meq | EXTENDED_RELEASE_TABLET | Freq: Two times a day (BID) | ORAL | 0 refills | Status: DC

## 2019-09-28 MED ORDER — APIXABAN 5 MG PO TABS: ORAL_TABLET | ORAL | 0 refills | Status: DC

## 2019-09-28 MED ORDER — HYDROCODONE-ACETAMINOPHEN 5-325 MG PO TABS
1.0000 | ORAL_TABLET | Freq: Once | ORAL | Status: AC
  Administered 2019-09-28: 17:00:00 1 via ORAL
  Filled 2019-09-28: qty 1

## 2019-09-28 MED ORDER — APIXABAN 5 MG PO TABS
10.0000 mg | ORAL_TABLET | Freq: Once | ORAL | Status: AC
  Administered 2019-09-28: 10 mg via ORAL
  Filled 2019-09-28: qty 2

## 2019-09-28 MED ORDER — APIXABAN (ELIQUIS) EDUCATION KIT FOR DVT/PE PATIENTS: PACK | Freq: Once | Status: DC

## 2019-09-28 MED ORDER — HYDROCODONE-ACETAMINOPHEN 5-325 MG PO TABS: 1.0000 | ORAL_TABLET | Freq: Four times a day (QID) | ORAL | 0 refills | Status: DC | PRN

## 2019-09-28 NOTE — Progress Notes (Signed)
VASCULAR LAB PRELIMINARY  PRELIMINARY  PRELIMINARY  PRELIMINARY  Left lower extremity venous duplex completed.    Preliminary report:  See CV proc for preliminary results.   Gave report to [facility], PA-C   Shalise Rosado, RVT 09/28/2019, 6:20 PM

## 2019-09-28 NOTE — ED Triage Notes (Signed)
Per patient, he woke up two days ago and left calf was painful. Patient then noticed swelling. Patient presents to ED with significant swelling of left calf area, redness, warm and tender to touch. No injury to left calf.

## 2019-09-28 NOTE — ED Provider Notes (Signed)
[name]-EMERGENCY DEPT Provider Note   CSN: [id number] Arrival date & time: 09/28/19  1622     History Chief Complaint  Patient presents with  . Leg Swelling    Jesus Fuller is a 44 y.o. male presenting for evaluation of left leg pain.  Patient states 2 days ago he woke up with severe left calf pain.  Since then, pain has worsened, his left lower leg has become more swollen.  Patient states pain is to the point where is having difficulty walking.  He has been taking aspirin and Tylenol without improvement of symptoms.  He denies fall, trauma, or injury.  He denies change in activity or increased activity.  He denies fevers, chills, cough, chest pain, shortness breath, nausea, vomiting, abdominal pain, pain in the right leg.  He denies recent travel, surgeries, history of cancer, history of previous DVT/PE, or hormone use.  He smokes 1.5 to 2 pack of cigarettes a day.  Patient states he recently had a left wrist splint on, but no other immobilization.  He reports a history of hypertension for which he takes medication (has had it today). He had a NSTEMI 10/2018. Pt states he is feeling very anxious today.   HPI     Past Medical History:  Diagnosis Date  . Asthma   . Hypertension    Takes no medicine    Patient Active Problem List   Diagnosis Date Noted  . Acute coronary syndrome (HCC) 10/23/2018    Past Surgical History:  Procedure Laterality Date  . RIGHT/LEFT HEART CATH AND CORONARY ANGIOGRAPHY N/A 10/24/2018   Procedure: RIGHT/LEFT HEART CATH AND CORONARY ANGIOGRAPHY;  Surgeon: Orpah Cobb, MD;  Location: [facility] INVASIVE CV LAB;  Service: Cardiovascular;  Laterality: N/A;       Family History  Problem Relation Age of Onset  . Cancer Mother   . COPD Mother   . Diabetes Father     Social History   Tobacco Use  . Smoking status: Current Every Day Smoker    Packs/day: 1.50    Types: Cigarettes  . Smokeless tobacco: Never Used  Substance  Use Topics  . Alcohol use: Yes    Comment: occasional  . Drug use: No    Home Medications Prior to Admission medications   Medication Sig Start Date End Date Taking? Authorizing Provider  apixaban (ELIQUIS) 5 MG TABS tablet Take two 5 mg tablets twice a day for 7 days.  On day 8, switch to one 5 mg tablet twice a day 09/28/19   [name], PA-C  chlordiazePOXIDE (LIBRIUM) 25 MG capsule 50mg  PO TID x 1D, then 25-50mg  PO BID X 1D, then 25-50mg  PO QD X 1D 03/27/19   05/27/19, PA-C  famotidine (PEPCID) 20 MG tablet Take 1 tablet (20 mg total) by mouth 2 (two) times daily. 03/27/19   05/27/19, PA-C  HYDROcodone-acetaminophen (NORCO/VICODIN) 5-325 MG tablet Take 1 tablet by mouth every 6 (six) hours as needed for severe pain. 09/28/19   [name], PA-C  metoprolol tartrate (LOPRESSOR) 25 MG tablet Take 1 tablet (25 mg total) by mouth 2 (two) times daily. Patient taking differently: Take 25 mg by mouth daily.  10/25/18   12/25/18, MD  omeprazole (PRILOSEC) 20 MG capsule Take 1 capsule (20 mg total) by mouth daily. 03/27/19   05/27/19, PA-C  potassium chloride (KLOR-CON) 10 MEQ tablet Take 1 tablet (10 mEq total) by mouth 2 (two) times daily for 7 days. 09/28/19 10/05/19  [name], PA-C  promethazine (PHENERGAN) 25 MG tablet Take 1 tablet (25 mg total) by mouth every 6 (six) hours as needed for nausea or vomiting. 03/27/19   [name], PA-C  sucralfate (CARAFATE) 1 g tablet Take 1 tablet (1 g total) by mouth 4 (four) times daily -  with meals and at bedtime. 03/27/19   [name], PA-C  atorvastatin (LIPITOR) 40 MG tablet Take 1 tablet (40 mg total) by mouth daily at 6 PM. Patient not taking: Reported on 03/27/2019 10/25/18 03/27/19  Orpah Cobb, MD  losartan (COZAAR) 25 MG tablet Take 1 tablet (25 mg total) by mouth daily. Patient not taking: Reported on 03/27/2019 10/25/18 03/27/19  Orpah Cobb, MD    Allergies    Patient has no known allergies.  Review of  Systems   Review of Systems  Musculoskeletal: Positive for myalgias.  Psychiatric/Behavioral: The patient is nervous/anxious.   All other systems reviewed and are negative.   Physical Exam Updated Vital Signs BP (!) 168/110 (BP Location: Left Arm)   Pulse 100   Temp 98.9 F (37.2 C) (Oral)   Resp 18   Ht 5\' 9"  (1.753 m)   SpO2 95%   BMI 28.69 kg/m   Physical Exam Vitals and nursing note reviewed.  Constitutional:      General: He is not in acute distress.    Appearance: He is well-developed.     Comments: Appears uncomfortable due to pain and anxious, but otherwise nontoxic  HENT:     Head: Normocephalic and atraumatic.  Eyes:     Conjunctiva/sclera: Conjunctivae normal.     Pupils: Pupils are equal, round, and reactive to light.  Cardiovascular:     Rate and Rhythm: Regular rhythm. Tachycardia present.     Pulses: Normal pulses.  Pulmonary:     Effort: Pulmonary effort is normal. No respiratory distress.     Breath sounds: Normal breath sounds. No wheezing.  Abdominal:     General: There is no distension.     Palpations: Abdomen is soft. There is no mass.     Tenderness: There is no abdominal tenderness. There is no guarding or rebound.  Musculoskeletal:        General: Swelling present.     Cervical back: Normal range of motion and neck supple.     Comments: Swelling of the left calf and lower leg when compared to the right.  Pedal pulses 2+ bilaterally.  Mild erythema and warmth of the left lower extremity, but is not beefy red. No ttp proximal to the knee. No ttp of the R leg.   Skin:    General: Skin is warm and dry.     Capillary Refill: Capillary refill takes less than 2 seconds.  Neurological:     Mental Status: He is alert and oriented to person, place, and time.  Psychiatric:        Mood and Affect: Mood is anxious.     Comments: Patient appears extremely anxious     ED Results / Procedures / Treatments   Labs (all labs ordered are listed, but only  abnormal results are displayed) Labs Reviewed  BASIC METABOLIC PANEL - Abnormal; Notable for the following components:      Result Value   Potassium 3.0 (*)    Glucose, Bld 106 (*)    All other components within normal limits  CBC - Abnormal; Notable for the following components:   WBC 12.2 (*)    RBC 3.34 (*)  HCT 37.7 (*)    MCV 112.9 (*)    MCH 40.4 (*)    All other components within normal limits    EKG EKG Interpretation  Date/Time:  Saturday September 28 2019 16:56:58 EST Ventricular Rate:  106 PR Interval:    QRS Duration: 92 QT Interval:  355 QTC Calculation: 472 R Axis:   81 Text Interpretation: Sinus tachycardia Ventricular premature complex Aberrant conduction of SV complex(es) No STEMI Confirmed by Alvester Chou [phone]) on 09/28/2019 5:05:22 PM   Radiology VAS [place] LOWER EXTREMITY VENOUS (DVT) ([facility] and [facility] 7a-7p)  Result Date: 09/28/2019  Lower Venous DVTStudy Indications: Pain, and Swelling.  Comparison Study: No prior study on file Performing Technologist: Sherren Kerns RVS  Examination Guidelines: A complete evaluation includes B-mode imaging, spectral Doppler, color Doppler, and power Doppler as needed of all accessible portions of each vessel. Bilateral testing is considered an integral part of a complete examination. Limited examinations for reoccurring indications may be performed as noted. The reflux portion of the exam is performed with the patient in reverse Trendelenburg.  +-----+---------------+---------+-----------+----------+--------------+ RIGHTCompressibilityPhasicitySpontaneityPropertiesThrombus Aging +-----+---------------+---------+-----------+----------+--------------+ CFV  Full           Yes      Yes                                 +-----+---------------+---------+-----------+----------+--------------+   +---------+---------------+---------+-----------+----------+--------------+ LEFT      CompressibilityPhasicitySpontaneityPropertiesThrombus Aging +---------+---------------+---------+-----------+----------+--------------+ CFV      Full           Yes      Yes                                 +---------+---------------+---------+-----------+----------+--------------+ SFJ      Full                                                        +---------+---------------+---------+-----------+----------+--------------+ FV Prox  Full                                                        +---------+---------------+---------+-----------+----------+--------------+ FV Mid   Full                                                        +---------+---------------+---------+-----------+----------+--------------+ FV DistalNone                                         Acute          +---------+---------------+---------+-----------+----------+--------------+ PFV      Full                                                        +---------+---------------+---------+-----------+----------+--------------+  POP      None           No       No                   Acute          +---------+---------------+---------+-----------+----------+--------------+ PTV      None                                         Acute          +---------+---------------+---------+-----------+----------+--------------+ PERO     None                                         Acute          +---------+---------------+---------+-----------+----------+--------------+ GSV      Full                                                        +---------+---------------+---------+-----------+----------+--------------+     Summary: RIGHT: - No evidence of common femoral vein obstruction.  LEFT: - Findings consistent with acute deep vein thrombosis involving the left femoral vein, left popliteal vein, left posterior tibial veins, and left peroneal veins.  *See table(s) above for measurements and  observations.    Preliminary     Procedures Procedures (including critical care time)  Medications Ordered in ED Medications  HYDROcodone-acetaminophen (NORCO/VICODIN) 5-325 MG per tablet 1 tablet (1 tablet Oral Given 09/28/19 1700)  apixaban (ELIQUIS) tablet 10 mg (10 mg Oral Given 09/28/19 1823)    ED Course  I have reviewed the triage vital signs and the nursing notes.  Pertinent labs & imaging results that were available during my care of the patient were reviewed by me and considered in my medical decision making (see chart for details).    MDM Rules/Calculators/A&P                      Patient presenting for evaluation of left leg pain and swelling.  On exam, patient does have a painful swollen left leg.  Additionally, patient appears very anxious.  He is tachycardic and hypertensive, however this is likely due to pain and anxiety.  He has no chest pain or shortness of breath, as such, less concern for PE.  I have high suspicion for DVT as the cause of his left leg symptoms.  If ultrasound is negative, also consider cellulitis, although less likely.  With pain control, patient's heart rate, tachypnea, blood pressure improved.  DVT ultrasound positive, with large left lower extremity clot burden.  Patient has good sensation and cap refill.  I do not believe he needs emergent vascular procedure.  Of note, patient's potassium is mildly low at 3.0.  Likely secondary to his blood pressure medications.  Will replenish orally.  Will give first dose of Eliquis in the ED, and discussed importance of taking at home.  Will give short course of pain control.  Stressed importance of follow-up with primary care, resources given.  Additionally, patient given information for vascular surgery as needed.  Discussed strict monitoring of symptoms, return with  any worsening pain, numbness, or foot discoloration.  Return with chest pain or shortness of breath.  At this time, patient appears safe for  discharge.  Patient states he understands and agrees to plan.  Final Clinical Impression(s) / ED Diagnoses Final diagnoses:  Acute deep vein thrombosis (DVT) of left lower extremity, unspecified vein (HCC)  Hypokalemia    Rx / DC Orders ED Discharge Orders         Ordered    apixaban (ELIQUIS) 5 MG TABS tablet     09/28/19 1834    HYDROcodone-acetaminophen (NORCO/VICODIN) 5-325 MG tablet  Every 6 hours PRN     09/28/19 1835    potassium chloride (KLOR-CON) 10 MEQ tablet  2 times daily     09/28/19 1839           Franchot Heidelberg, PA-C 09/28/19 1901    Wyvonnia Dusky, MD 09/29/19 1217

## 2019-09-28 NOTE — Discharge Instructions (Addendum)
It is extremely important that you follow-up with primary care.  Follow-up with the [facility] health and wellness clinic listed below.  I have contacted our social work team so they are aware that she will be contacting them. Take the Eliquis as prescribed.  Do not take other anti-inflammatory such as ibuprofen, aspirin, Aleve, naproxen, Advil.  Instead, take Tylenol as needed for pain. Use Norco as needed for severe breakthrough pain.  Have caution, this may make you tired or groggy.  Do not drive or operate heavy machinery while taking this medicine. Your potassium was slightly low today.  Take the potassium pills as prescribed for the next week. Return to the emergency room immediately if you develop chest pain or shortness of breath.  Return if you develop worsening pain in your leg, especially if you have numbness or color change of your leg. I have also given you the information for the vascular doctor who can help manage your DVT as needed. Return to the ER with any new, worsening, or concerning symptoms.    Information on my medicine - ELIQUIS (apixaban)  This medication education was reviewed with me or my healthcare representative as part of my discharge preparation.  The pharmacist that spoke with me during my hospital stay was:  WOFFORD, DREW A, RPH  Why was Eliquis prescribed for you? Eliquis was prescribed to treat blood clots that may have been found in the veins of your legs (deep vein thrombosis) or in your lungs (pulmonary embolism) and to reduce the risk of them occurring again.  What do You need to know about Eliquis ? The starting dose is 10 mg (two 5 mg tablets) taken TWICE daily for the FIRST SEVEN (7) DAYS, then on (enter date)  10/06/19  the dose is reduced to ONE 5 mg tablet taken TWICE daily.  Eliquis may be taken with or without food.   Try to take the dose about the same time in the morning and in the evening. If you have difficulty swallowing the tablet whole please  discuss with your pharmacist how to take the medication safely.  Take Eliquis exactly as prescribed and DO NOT stop taking Eliquis without talking to the doctor who prescribed the medication.  Stopping may increase your risk of developing a new blood clot.  Refill your prescription before you run out.  After discharge, you should have regular check-up appointments with your healthcare provider that is prescribing your Eliquis.    What do you do if you miss a dose? If a dose of ELIQUIS is not taken at the scheduled time, take it as soon as possible on the same day and twice-daily administration should be resumed. The dose should not be doubled to make up for a missed dose.  Important Safety Information A possible side effect of Eliquis is bleeding. You should call your healthcare provider right away if you experience any of the following: Bleeding from an injury or your nose that does not stop. Unusual colored urine (red or dark brown) or unusual colored stools (red or black). Unusual bruising for unknown reasons. A serious fall or if you hit your head (even if there is no bleeding).  Some medicines may interact with Eliquis and might increase your risk of bleeding or clotting while on Eliquis. To help avoid this, consult your healthcare provider or pharmacist prior to using any new prescription or non-prescription medications, including herbals, vitamins, non-steroidal anti-inflammatory drugs (NSAIDs) and supplements.  This website has more information on Eliquis (  apixaban): http://www.eliquis.com/eliquis/home

## 2019-09-28 NOTE — ED Notes (Signed)
Sophia, PA at bedside, aware of elevated BP/hr

## 2019-10-01 ENCOUNTER — Telehealth: Payer: Self-pay

## 2019-10-01 NOTE — Telephone Encounter (Signed)
At request of Michel Bickers, RN CM attempted to contact  patient to schedule follow up appointment at [facility]. Calls placed to # [phone] and [phone] and the phone just rings at both numbers. No option for voice mail.  The phone number for [facility] is on his ED AVS to call to schedule an appointment,   Update provided to Beecher Mcardle, RN CM

## 2019-10-03 ENCOUNTER — Telehealth: Payer: Self-pay

## 2019-10-03 NOTE — Telephone Encounter (Signed)
At request of Michel Bickers, RN CM attempted again to contact  patient to schedule follow up appointment at [facility]. Calls placed to # [phone] and [phone] and the messages on both phones state that the call is not able to be completed at this time. No option for voice mail.  The phone number for [facility] is on his ED AVS to call to schedule an appointment.  Call placed to patient's mother # [phone] and the patient was with her.  This CM spoke to patient and scheduled him for HFU appointment at [facility] 10/15/2019 1450. Provided him with the address for clinic and instructed him to call this CM with any questions. He said that he will need assistance with paying for medications and financial counseling.

## 2019-10-14 ENCOUNTER — Telehealth: Payer: Self-pay

## 2019-10-14 NOTE — Telephone Encounter (Signed)
Called patient to do their pre-visit COVID screening.  Phone just rings. Unable to leave voicemail. Unable to do prescreening.

## 2019-10-14 NOTE — Patient Instructions (Signed)
Thank you for choosing Primary Care at [facility] to be your medical home!    Jesus Fuller was seen by De Hollingshead, DO today.   Jesus Fuller's primary care provider is Marcy Siren, DO.   For the best care possible, you should try to see Marcy Siren, DO whenever you come to the clinic.   We look forward to seeing you again soon!  If you have any questions about your visit today, please call us at [phone] or feel free to reach your primary care provider via MyChart.

## 2019-10-15 ENCOUNTER — Ambulatory Visit (INDEPENDENT_AMBULATORY_CARE_PROVIDER_SITE_OTHER): Payer: Self-pay | Admitting: Internal Medicine

## 2019-10-15 ENCOUNTER — Other Ambulatory Visit: Payer: Self-pay

## 2019-10-15 ENCOUNTER — Encounter: Payer: Self-pay | Admitting: Internal Medicine

## 2019-10-15 VITALS — BP 132/86 | HR 109 | Temp 97.2°F | Resp 18 | Ht 67.0 in | Wt 230.0 lb

## 2019-10-15 DIAGNOSIS — E876 Hypokalemia: Secondary | ICD-10-CM

## 2019-10-15 DIAGNOSIS — Z09 Encounter for follow-up examination after completed treatment for conditions other than malignant neoplasm: Secondary | ICD-10-CM

## 2019-10-15 DIAGNOSIS — Z7689 Persons encountering health services in other specified circumstances: Secondary | ICD-10-CM

## 2019-10-15 DIAGNOSIS — I82402 Acute embolism and thrombosis of unspecified deep veins of left lower extremity: Secondary | ICD-10-CM

## 2019-10-15 MED ORDER — APIXABAN 5 MG PO TABS: 5.0000 mg | ORAL_TABLET | Freq: Two times a day (BID) | ORAL | 3 refills | Status: DC

## 2019-10-15 MED FILL — ELIQUIS 5 MG TABLET: 5 | 30 days supply | Qty: 60 | Fill #0

## 2019-10-15 NOTE — Progress Notes (Signed)
  Subjective:    Jesus Fuller - 44 y.o. male MRN [id number]  Date of birth: Jan 11, 1976  HPI  Jesus Fuller is here for to establish care and for hospital follow up. Patient was seen in ED on 3/13, found to have DVT on ultrasound in left LE with large clot burden. Patient was started on Eliquis. Was taking 10 mg twice per day. Now taking 5 mg BID.    Patient denies prior history of VTE. No significant family medical history of hypercoagulability that patient is aware of. No recent travel. History of falling on porch and injuring his left leg. However, he remained mobile and never spent prolonged period of time immobile. He is a smoker. No history of malignancy.   Patient reports edema remains. Still having significant pain in the area. Denies SOB or trouble breathing. No cough.      Health Maintenance:  Health Maintenance Due  Topic Date Due  . INFLUENZA VACCINE  Never done    -  reports that he has been smoking cigarettes. He has been smoking about 1.50 packs per day. He has never used smokeless tobacco. - Review of Systems: Per HPI. - Past Medical History: Patient Active Problem List   Diagnosis Date Noted  . Acute coronary syndrome (HCC) 10/23/2018   - Medications: reviewed and updated   Objective:   Physical Exam BP 132/86   Pulse (!) 109   Temp (!) 97.2 F (36.2 C) (Temporal)   Resp 18   Ht 5\' 7"  (1.702 m)   Wt 230 lb (104.3 kg)   SpO2 94%   BMI 36.02 kg/m  Physical Exam  Constitutional: He is oriented to person, place, and time and well-developed, well-nourished, and in no distress. No distress.  HENT:  Head: Normocephalic and atraumatic.  Eyes: Conjunctivae and EOM are normal.  Cardiovascular: Normal rate, regular rhythm and normal heart sounds.  No murmur heard. Left LE edematous compared to right. Pedal pulses intact. Limb is warm.   Pulmonary/Chest: Effort normal and breath sounds normal. No respiratory distress.  Musculoskeletal:        General: Normal  range of motion.  Neurological: He is alert and oriented to person, place, and time.  Skin: Skin is warm and dry. He is not diaphoretic.  Psychiatric: Affect and judgment normal.           Assessment & Plan:    1. Encounter to establish care  2. Hospital discharge follow-up  3. Acute deep vein thrombosis (DVT) of left lower extremity, unspecified vein (HCC) Continue Eliquis. Patient is a tobacco user which would increase coagulopathy risk; however, no other inciting event or significant medical history so likely unprovoked. Will need to make a decision about long term anticoagulation. Patient is not at high risk for bleeding. Eventually would like him to be evaluated by heme; however, currently does not have insurance. Will work on for patient. Reasons to return to emergency department were reviewed with patient. Currently, he is stable.  - apixaban (ELIQUIS) 5 MG TABS tablet; Take 1 tablet (5 mg total) by mouth 2 (two) times daily.  Dispense: 60 tablet; Refill: 3  4. Hypokalemia K 3.0 at ER visit. Patient received repletion. Repeat lab today.  - Basic Metabolic Panel    Corporate investment banker, D.O. 10/15/2019, 3:01 PM Primary Care at [facility]

## 2019-10-16 ENCOUNTER — Other Ambulatory Visit: Payer: Self-pay | Admitting: Internal Medicine

## 2019-10-16 DIAGNOSIS — E876 Hypokalemia: Secondary | ICD-10-CM

## 2019-10-16 LAB — BASIC METABOLIC PANEL
BUN/Creatinine Ratio: 9 (ref 9–20)
BUN: 8 mg/dL (ref 6–24)
CO2: 23 mmol/L (ref 20–29)
Calcium: 9.2 mg/dL (ref 8.7–10.2)
Chloride: 104 mmol/L (ref 96–106)
Creatinine, Ser: 0.87 mg/dL (ref 0.76–1.27)
GFR calc Af Amer: 124 mL/min/{1.73_m2} (ref >59)
GFR calc non Af Amer: 107 mL/min/{1.73_m2} (ref >59)
Glucose: 133 mg/dL — ABNORMAL HIGH (ref 65–99)
Potassium: 3.2 mmol/L — ABNORMAL LOW (ref 3.5–5.2)
Sodium: 143 mmol/L (ref 134–144)

## 2019-10-16 MED ORDER — POTASSIUM CHLORIDE CRYS ER 20 MEQ PO TBCR: 20.0000 meq | EXTENDED_RELEASE_TABLET | Freq: Two times a day (BID) | ORAL | 0 refills | Status: DC

## 2019-10-17 NOTE — Progress Notes (Signed)
Patient notified of results & recommendations. Expressed understanding. Made a lab appointment for 10/21/2019 @ 3 PM.

## 2019-10-21 ENCOUNTER — Other Ambulatory Visit: Payer: Self-pay

## 2019-11-08 ENCOUNTER — Ambulatory Visit: Payer: Self-pay | Admitting: *Deleted

## 2019-11-08 NOTE — Telephone Encounter (Signed)
Patient calling after being diagnosed with blood clots in LLE 2 months ago. Pt states for the past 3 days he has noticed an increase in swelling to the left leg and right foot. Pt states that the swelling in the LLE is from his foot up to his thigh. Pt reports that he has been taking his blood thinner regularly.Pt states he has been working outside more for the past 3 days and wants to know if this could be the cause of the increased swelling. Pt advised to contact PCP for evaluation of current symptoms. Pt advised to seek treatment in the ED for worsening symptoms. Understanding verbalized.   Reason for Disposition . [1] Thigh, calf, or ankle swelling AND [2] bilateral AND [3] 1 side is more swollen  Answer Assessment - Initial Assessment Questions 1. ONSET: "When did the swelling start?" (e.g., minutes, hours, days)     2 months ago 2. LOCATION: "What part of the leg is swollen?"  "Are both legs swollen or just one leg?"     Left leg swelling goes from foot to thigh, right leg swelling is mostly in foot 3. SEVERITY: "How bad is the swelling?" (e.g., localized; mild, moderate, severe)  - Localized - small area of swelling localized to one leg  - MILD pedal edema - swelling limited to foot and ankle, pitting edema < 1/4 inch (6 mm) deep, rest and elevation eliminate most or all swelling  - MODERATE edema - swelling of lower leg to knee, pitting edema > 1/4 inch (6 mm) deep, rest and elevation only partially reduce swelling  - SEVERE edema - swelling extends above knee, facial or hand swelling present      severe 4. REDNESS: "Does the swelling look red or infected?"     no 5. PAIN: "Is the swelling painful to touch?" If so, ask: "How painful is it?"   (Scale 1-10; mild, moderate or severe)     Patient states it is more painful, pain rating not given 6. FEVER: "Do you have a fever?" If so, ask: "What is it, how was it measured, and when did it start?"      no 7. CAUSE: "What do you think is  causing the leg swelling?"     Diagnosed with DVT previously 8. MEDICAL HISTORY: "Do you have a history of heart failure, kidney disease, liver failure, or cancer?"     HTN, "small heart attacks" 9. RECURRENT SYMPTOM: "Have you had leg swelling before?" If so, ask: "When was the last time?" "What happened that time?"     Yes diagnosed with DVT a month ago 10. OTHER SYMPTOMS: "Do you have any other symptoms?" (e.g., chest pain, difficulty breathing)       no 11. PREGNANCY: "Is there any chance you are pregnant?" "When was your last menstrual period?"       n.a  Protocols used: LEG SWELLING AND EDEMA-A-AH

## 2019-11-10 ENCOUNTER — Encounter (HOSPITAL_COMMUNITY): Payer: Self-pay | Admitting: Emergency Medicine

## 2019-11-10 ENCOUNTER — Inpatient Hospital Stay (HOSPITAL_COMMUNITY)
Admission: EM | Admit: 2019-11-10 | Discharge: 2019-11-12 | DRG: 292 | Disposition: A | Discharge status: Home / Self Care | Payer: Self-pay | Attending: Internal Medicine | Admitting: Internal Medicine

## 2019-11-10 ENCOUNTER — Other Ambulatory Visit: Payer: Self-pay

## 2019-11-10 ENCOUNTER — Emergency Department (HOSPITAL_COMMUNITY): Payer: Self-pay

## 2019-11-10 DIAGNOSIS — Z720 Tobacco use: Secondary | ICD-10-CM | POA: Diagnosis present

## 2019-11-10 DIAGNOSIS — I5033 Acute on chronic diastolic (congestive) heart failure: Secondary | ICD-10-CM | POA: Diagnosis present

## 2019-11-10 DIAGNOSIS — I824Y2 Acute embolism and thrombosis of unspecified deep veins of left proximal lower extremity: Secondary | ICD-10-CM

## 2019-11-10 DIAGNOSIS — E876 Hypokalemia: Secondary | ICD-10-CM | POA: Diagnosis present

## 2019-11-10 DIAGNOSIS — Z7901 Long term (current) use of anticoagulants: Secondary | ICD-10-CM

## 2019-11-10 DIAGNOSIS — M7989 Other specified soft tissue disorders: Secondary | ICD-10-CM

## 2019-11-10 DIAGNOSIS — Z20822 Contact with and (suspected) exposure to covid-19: Secondary | ICD-10-CM | POA: Diagnosis present

## 2019-11-10 DIAGNOSIS — Z9114 Patient's other noncompliance with medication regimen: Secondary | ICD-10-CM

## 2019-11-10 DIAGNOSIS — I509 Heart failure, unspecified: Secondary | ICD-10-CM

## 2019-11-10 DIAGNOSIS — D7589 Other specified diseases of blood and blood-forming organs: Secondary | ICD-10-CM | POA: Diagnosis present

## 2019-11-10 DIAGNOSIS — F1010 Alcohol abuse, uncomplicated: Secondary | ICD-10-CM | POA: Diagnosis present

## 2019-11-10 DIAGNOSIS — Z825 Family history of asthma and other chronic lower respiratory diseases: Secondary | ICD-10-CM

## 2019-11-10 DIAGNOSIS — Z9111 Patient's noncompliance with dietary regimen: Secondary | ICD-10-CM

## 2019-11-10 DIAGNOSIS — I420 Dilated cardiomyopathy: Secondary | ICD-10-CM | POA: Diagnosis present

## 2019-11-10 DIAGNOSIS — I110 Hypertensive heart disease with heart failure: Principal | ICD-10-CM | POA: Diagnosis present

## 2019-11-10 DIAGNOSIS — Z833 Family history of diabetes mellitus: Secondary | ICD-10-CM

## 2019-11-10 DIAGNOSIS — I161 Hypertensive emergency: Secondary | ICD-10-CM | POA: Diagnosis present

## 2019-11-10 DIAGNOSIS — F17210 Nicotine dependence, cigarettes, uncomplicated: Secondary | ICD-10-CM | POA: Diagnosis present

## 2019-11-10 DIAGNOSIS — R601 Generalized edema: Secondary | ICD-10-CM

## 2019-11-10 DIAGNOSIS — Z86718 Personal history of other venous thrombosis and embolism: Secondary | ICD-10-CM

## 2019-11-10 DIAGNOSIS — M79609 Pain in unspecified limb: Secondary | ICD-10-CM

## 2019-11-10 DIAGNOSIS — Z6834 Body mass index (BMI) 34.0-34.9, adult: Secondary | ICD-10-CM

## 2019-11-10 DIAGNOSIS — E669 Obesity, unspecified: Secondary | ICD-10-CM | POA: Diagnosis present

## 2019-11-10 DIAGNOSIS — Z809 Family history of malignant neoplasm, unspecified: Secondary | ICD-10-CM

## 2019-11-10 LAB — CBC WITH DIFFERENTIAL/PLATELET
Abs Immature Granulocytes: 0.02 10*3/uL (ref 0.00–0.07)
Basophils Absolute: 0.1 10*3/uL (ref 0.0–0.1)
Basophils Relative: 1 %
Eosinophils Absolute: 0.1 10*3/uL (ref 0.0–0.5)
Eosinophils Relative: 1 %
HCT: 42.1 % (ref 39.0–52.0)
Hemoglobin: 14.9 g/dL (ref 13.0–17.0)
Immature Granulocytes: 0 %
Lymphocytes Relative: 17 %
Lymphs Abs: 1.2 10*3/uL (ref 0.7–4.0)
MCH: 40.8 pg — ABNORMAL HIGH (ref 26.0–34.0)
MCHC: 35.4 g/dL (ref 30.0–36.0)
MCV: 115.3 fL — ABNORMAL HIGH (ref 80.0–100.0)
Monocytes Absolute: 0.6 10*3/uL (ref 0.1–1.0)
Monocytes Relative: 8 %
Neutro Abs: 5.2 10*3/uL (ref 1.7–7.7)
Neutrophils Relative %: 73 %
Platelets: 225 10*3/uL (ref 150–400)
RBC: 3.65 MIL/uL — ABNORMAL LOW (ref 4.22–5.81)
RDW: 13.1 % (ref 11.5–15.5)
WBC: 7.1 10*3/uL (ref 4.0–10.5)
nRBC: 0 % (ref 0.0–0.2)

## 2019-11-10 LAB — COMPREHENSIVE METABOLIC PANEL
ALT: 45 U/L — ABNORMAL HIGH (ref 0–44)
AST: 48 U/L — ABNORMAL HIGH (ref 15–41)
Albumin: 3.9 g/dL (ref 3.5–5.0)
Alkaline Phosphatase: 67 U/L (ref 38–126)
Anion gap: 11 (ref 5–15)
BUN: 6 mg/dL (ref 6–20)
CO2: 30 mmol/L (ref 22–32)
Calcium: 9.3 mg/dL (ref 8.9–10.3)
Chloride: 101 mmol/L (ref 98–111)
Creatinine, Ser: 0.81 mg/dL (ref 0.61–1.24)
GFR calc Af Amer: >60 mL/min (ref >60)
GFR calc non Af Amer: >60 mL/min (ref >60)
Glucose, Bld: 105 mg/dL — ABNORMAL HIGH (ref 70–99)
Potassium: 3.3 mmol/L — ABNORMAL LOW (ref 3.5–5.1)
Sodium: 142 mmol/L (ref 135–145)
Total Bilirubin: 1 mg/dL (ref 0.3–1.2)
Total Protein: 6.8 g/dL (ref 6.5–8.1)

## 2019-11-10 LAB — URINALYSIS, ROUTINE W REFLEX MICROSCOPIC
Bilirubin Urine: NEGATIVE
Glucose, UA: NEGATIVE mg/dL
Hgb urine dipstick: NEGATIVE
Ketones, ur: NEGATIVE mg/dL
Leukocytes,Ua: NEGATIVE
Nitrite: NEGATIVE
Protein, ur: NEGATIVE mg/dL
Specific Gravity, Urine: 1.015 (ref 1.005–1.030)
pH: 7 (ref 5.0–8.0)

## 2019-11-10 LAB — BRAIN NATRIURETIC PEPTIDE: B Natriuretic Peptide: 418.4 pg/mL — ABNORMAL HIGH (ref 0.0–100.0)

## 2019-11-10 LAB — TROPONIN I (HIGH SENSITIVITY)
Troponin I (High Sensitivity): 35 ng/L — ABNORMAL HIGH (ref <18)
Troponin I (High Sensitivity): 37 ng/L — ABNORMAL HIGH (ref <18)

## 2019-11-10 MED ORDER — CLONIDINE HCL 0.2 MG PO TABS
0.2000 mg | ORAL_TABLET | Freq: Once | ORAL | Status: AC
  Administered 2019-11-10: 0.2 mg via ORAL
  Filled 2019-11-10: qty 1

## 2019-11-10 MED ORDER — GUAIFENESIN 100 MG/5ML PO SOLN
5.0000 mL | ORAL | Status: DC | PRN
  Administered 2019-11-10: 23:00:00 100 mg via ORAL
  Filled 2019-11-10: qty 10

## 2019-11-10 MED ORDER — POTASSIUM CHLORIDE CRYS ER 20 MEQ PO TBCR
20.0000 meq | EXTENDED_RELEASE_TABLET | Freq: Two times a day (BID) | ORAL | Status: DC
  Administered 2019-11-10 – 2019-11-11 (×2): 20 meq via ORAL
  Filled 2019-11-10 (×2): qty 1

## 2019-11-10 MED ORDER — SODIUM CHLORIDE (PF) 0.9 % IJ SOLN
INTRAMUSCULAR | Status: AC
  Filled 2019-11-10: qty 50

## 2019-11-10 MED ORDER — TRAMADOL HCL 50 MG PO TABS
50.0000 mg | ORAL_TABLET | Freq: Three times a day (TID) | ORAL | Status: DC | PRN
  Administered 2019-11-12: 50 mg via ORAL
  Filled 2019-11-10: qty 1

## 2019-11-10 MED ORDER — ONDANSETRON HCL 4 MG/2ML IJ SOLN: 4.0000 mg | Freq: Four times a day (QID) | INTRAMUSCULAR | Status: DC | PRN

## 2019-11-10 MED ORDER — APIXABAN 5 MG PO TABS
5.0000 mg | ORAL_TABLET | Freq: Two times a day (BID) | ORAL | Status: DC
  Administered 2019-11-10 – 2019-11-12 (×4): 5 mg via ORAL
  Filled 2019-11-10 (×4): qty 1

## 2019-11-10 MED ORDER — HYDRALAZINE HCL 20 MG/ML IJ SOLN
5.0000 mg | INTRAMUSCULAR | Status: DC | PRN
  Administered 2019-11-10 – 2019-11-12 (×2): 5 mg via INTRAVENOUS
  Filled 2019-11-10 (×2): qty 1

## 2019-11-10 MED ORDER — METOPROLOL TARTRATE 25 MG PO TABS
25.0000 mg | ORAL_TABLET | Freq: Two times a day (BID) | ORAL | Status: DC
  Administered 2019-11-10 – 2019-11-12 (×4): 25 mg via ORAL
  Filled 2019-11-10 (×4): qty 1

## 2019-11-10 MED ORDER — NICOTINE 21 MG/24HR TD PT24
21.0000 mg | MEDICATED_PATCH | Freq: Every day | TRANSDERMAL | Status: DC
  Administered 2019-11-10 – 2019-11-12 (×3): 21 mg via TRANSDERMAL
  Filled 2019-11-10 (×3): qty 1

## 2019-11-10 MED ORDER — ALUM & MAG HYDROXIDE-SIMETH 200-200-20 MG/5ML PO SUSP
30.0000 mL | Freq: Once | ORAL | Status: AC
  Administered 2019-11-10: 30 mL via ORAL
  Filled 2019-11-10: qty 30

## 2019-11-10 MED ORDER — ONDANSETRON HCL 4 MG PO TABS: 4.0000 mg | ORAL_TABLET | Freq: Four times a day (QID) | ORAL | Status: DC | PRN

## 2019-11-10 MED ORDER — FUROSEMIDE 10 MG/ML IJ SOLN
80.0000 mg | Freq: Two times a day (BID) | INTRAMUSCULAR | Status: DC
  Administered 2019-11-10 – 2019-11-12 (×4): 80 mg via INTRAVENOUS
  Filled 2019-11-10 (×4): qty 8

## 2019-11-10 MED ORDER — IOHEXOL 350 MG/ML SOLN
125.0000 mL | Freq: Once | INTRAVENOUS | Status: AC | PRN
  Administered 2019-11-10: 14:00:00 125 mL via INTRAVENOUS

## 2019-11-10 NOTE — Progress Notes (Signed)
Patient's BP still in the 180s, Blount notified and night shift nurse informed as well to F/U with plan of care.

## 2019-11-10 NOTE — H&P (Addendum)
History and Physical    Jesus Fuller:[id number] DOB: 05-17-1976 DOA: 11/10/2019  PCP: Nicolette Bang, DO   Patient coming from: Home  I have personally briefly reviewed patient's old medical records in [place]  Chief Complaint: Leg swelling  HPI: Jesus Fuller is a 44 y.o. male with medical history significant of hypertension, nonischemic cardiomyopathy with EF of 35 to 40% on 10/25/2018 with subsequent cardiac catheterization showing normal coronaries; tobacco abuse, alcohol abuse, left leg DVT diagnosed a month ago currently on Eliquis presented with bilateral worsening leg swelling.  He states that he has been having worsening bilateral leg swelling for the last 3 days, more on the right side along with difficulty walking and sharp pains.  He feels that his thighs are full and also feels that his hips and abdomen has swollen.  Denies any real shortness of breath or chest pain.  No fever, nausea, vomiting, diarrhea.  Has not noticed any decrease or increased urine output.  Has been compliant with his Eliquis.  No loss of consciousness, palpitations, blurring of vision, seizures.  ED Course: Pressure was extremely elevated.  Ultrasound Doppler showed improvement of the left DVT and no clot as high as the iliacs bilaterally.  CT venogram of the abdomen was negative for acute pathology or DVT and CTA chest was negative for PE.  Hospitalist service was called to evaluate the patient  Review of Systems: As per HPI otherwise all other systems were reviewed and are negative.   Past Medical History:  Diagnosis Date  . Asthma   . Hypertension    Takes no medicine    Past Surgical History:  Procedure Laterality Date  . RIGHT/LEFT HEART CATH AND CORONARY ANGIOGRAPHY N/A 10/24/2018   Procedure: RIGHT/LEFT HEART CATH AND CORONARY ANGIOGRAPHY;  Surgeon: Dixie Dials, MD;  Location: [facility];  Service: Cardiovascular;  Laterality: N/A;   Social history  reports  that he has been smoking cigarettes. He has been smoking about 1.50 packs per day. He has never used smokeless tobacco. He reports current alcohol use. He reports that he does not use drugs.  No Known Allergies  Family History  Problem Relation Age of Onset  . Cancer Mother   . COPD Mother   . Diabetes Father     Prior to Admission medications   Medication Sig Start Date End Date Taking? Authorizing Provider  acetaminophen (TYLENOL) 500 MG tablet Take 500-1,000 mg by mouth every 6 (six) hours as needed for mild pain.   Yes [provider]  apixaban (ELIQUIS) 5 MG TABS tablet Take 1 tablet (5 mg total) by mouth 2 (two) times daily. 10/15/19  Yes Nicolette Bang, DO  metoprolol tartrate (LOPRESSOR) 25 MG tablet Take 1 tablet (25 mg total) by mouth 2 (two) times daily. Patient taking differently: Take 25 mg by mouth daily.  10/25/18  Yes Dixie Dials, MD  potassium chloride SA (KLOR-CON) 20 MEQ tablet Take 1 tablet (20 mEq total) by mouth 2 (two) times daily. 10/16/19  Yes Nicolette Bang, DO  atorvastatin (LIPITOR) 40 MG tablet Take 1 tablet (40 mg total) by mouth daily at 6 PM. Patient not taking: Reported on 03/27/2019 10/25/18 03/27/19  Dixie Dials, MD  losartan (COZAAR) 25 MG tablet Take 1 tablet (25 mg total) by mouth daily. Patient not taking: Reported on 03/27/2019 10/25/18 03/27/19  Dixie Dials, MD    Physical Exam: Vitals:   11/10/19 1232 11/10/19 1300 11/10/19 1330 11/10/19 1430  BP: (!) 185/114 (!) 190/118 (!) 186/117 (!) 188/132  Pulse: 93 80 88 (!) 106  Resp: 19 19 (!) 25 (!) 24  Temp:      TempSrc:      SpO2: 95% 95% 95% 95%    Constitutional: NAD, calm, comfortable Vitals:   11/10/19 1232 11/10/19 1300 11/10/19 1330 11/10/19 1430  BP: (!) 185/114 (!) 190/118 (!) 186/117 (!) 188/132  Pulse: 93 80 88 (!) 106  Resp: 19 19 (!) 25 (!) 24  Temp:      TempSrc:      SpO2: 95% 95% 95% 95%   Eyes: PERRL, lids and conjunctivae normal ENMT: Mucous  membranes are moist. Posterior pharynx clear of any exudate or lesions. Neck: normal, supple, no masses, no thyromegaly Respiratory: bilateral decreased breath sounds at bases, no wheezing; some scattered crackles.  Intermittently tachypneic.  No accessory muscle use.  Cardiovascular: S1 S2 positive, rate controlled.  2-3+ edema of his bilateral legs with some chronic lower extremity skin changes.  Swollen thighs as well. 2+ pedal pulses.  Abdomen: Distended.  No tenderness, no masses palpated. No hepatosplenomegaly. Bowel sounds positive.  Musculoskeletal: no clubbing / cyanosis. No joint deformity upper and lower extremities.  Skin: no rashes, lesions, ulcers. No induration Neurologic: CN 2-12 grossly intact. Moving extremities. No focal neurologic deficits.  Psychiatric: Looks anxious.  Alert and oriented x 3.   Labs on Admission: I have personally reviewed following labs and imaging studies  CBC: Recent Labs  Lab 11/10/19 1237  WBC 7.1  NEUTROABS 5.2  HGB 14.9  HCT 42.1  MCV 115.3*  PLT 225   Basic Metabolic Panel: Recent Labs  Lab 11/10/19 1237  NA 142  K 3.3*  CL 101  CO2 30  GLUCOSE 105*  BUN 6  CREATININE 0.81  CALCIUM 9.3   GFR: CrCl cannot be calculated (Unknown ideal weight.). Liver Function Tests: Recent Labs  Lab 11/10/19 1237  AST 48*  ALT 45*  ALKPHOS 67  BILITOT 1.0  PROT 6.8  ALBUMIN 3.9   No results for input(s): LIPASE, AMYLASE in the last 168 hours. No results for input(s): AMMONIA in the last 168 hours. Coagulation Profile: No results for input(s): INR, PROTIME in the last 168 hours. Cardiac Enzymes: No results for input(s): CKTOTAL, CKMB, CKMBINDEX, TROPONINI in the last 168 hours. BNP (last 3 results) No results for input(s): PROBNP in the last 8760 hours. HbA1C: No results for input(s): HGBA1C in the last 72 hours. CBG: No results for input(s): GLUCAP in the last 168 hours. Lipid Profile: No results for input(s): CHOL, HDL,  LDLCALC, TRIG, CHOLHDL, LDLDIRECT in the last 72 hours. Thyroid Function Tests: No results for input(s): TSH, T4TOTAL, FREET4, T3FREE, THYROIDAB in the last 72 hours. Anemia Panel: No results for input(s): VITAMINB12, FOLATE, FERRITIN, TIBC, IRON, RETICCTPCT in the last 72 hours. Urine analysis:    Component Value Date/Time   COLORURINE YELLOW 11/10/2019 1438   APPEARANCEUR CLEAR 11/10/2019 1438   LABSPEC 1.015 11/10/2019 1438   PHURINE 7.0 11/10/2019 1438   GLUCOSEU NEGATIVE 11/10/2019 1438   HGBUR NEGATIVE 11/10/2019 1438   BILIRUBINUR NEGATIVE 11/10/2019 1438   KETONESUR NEGATIVE 11/10/2019 1438   PROTEINUR NEGATIVE 11/10/2019 1438   NITRITE NEGATIVE 11/10/2019 1438   LEUKOCYTESUR NEGATIVE 11/10/2019 1438    Radiological Exams on Admission: CT Angio Chest PE W and/or Wo Contrast  Result Date: 11/10/2019 CLINICAL DATA:  Short of breath, tachycardia EXAM: CT ANGIOGRAPHY CHEST WITH CONTRAST TECHNIQUE: Multidetector CT imaging of the  chest was performed using the standard protocol during bolus administration of intravenous contrast. Multiplanar CT image reconstructions and MIPs were obtained to evaluate the vascular anatomy. CONTRAST:  OMNIPAQUE IOHEXOL 350 MG/ML SOLN COMPARISON:  11/10/2019 FINDINGS: Cardiovascular: This is a technically adequate evaluation of the pulmonary vasculature. No filling defects or pulmonary emboli. Heart is unremarkable without pericardial effusion. Normal caliber of the thoracic aorta. Mediastinum/Nodes: Numerous borderline enlarged hilar and mediastinal lymph nodes, nonspecific. Largest at the AP window measures 13 mm in short axis. Trachea, esophagus, and thyroid are unremarkable. Lungs/Pleura: No acute airspace disease, effusion, or pneumothorax. Central airways are widely patent. Upper Abdomen: No acute abnormality. Musculoskeletal: No acute or destructive bony lesions. Reconstructed images demonstrate no additional findings. Review of the MIP images  confirms the above findings. IMPRESSION: 1. No CT evidence of pulmonary embolism. 2. No acute airspace disease. 3. Numerous borderline enlarged mediastinal and hilar lymph nodes, nonspecific. Electronically Signed   By: Sharlet Salina M.D.   On: 11/10/2019 15:05   DG Chest Portable 1 View  Result Date: 11/10/2019 CLINICAL DATA:  Tachycardia EXAM: PORTABLE CHEST 1 VIEW COMPARISON:  10/23/2018 FINDINGS: Cardiomegaly. Both lungs are clear. The visualized skeletal structures are unremarkable. IMPRESSION: Cardiomegaly without acute abnormality of the lungs in AP portable projection. Electronically Signed   By: Lauralyn Primes M.D.   On: 11/10/2019 12:48   CT VENOGRAM ABD/PEL  Result Date: 11/10/2019 CLINICAL DATA:  45 year old male with bilateral lower extremity swelling and concern for DVT. EXAM: CT ABDOMEN AND PELVIS WITH CONTRAST TECHNIQUE: Multidetector CT imaging of the abdomen and pelvis was performed using the standard protocol following bolus administration of intravenous contrast. CONTRAST:  OMNIPAQUE IOHEXOL 350 MG/ML SOLN COMPARISON:  None. FINDINGS: Lower chest: Minimal bibasilar dependent atelectasis. The visualized lung bases are otherwise clear. No intra-abdominal free air or free fluid. Hepatobiliary: Mild fatty infiltration of the liver. No intrahepatic biliary ductal dilatation. The gallbladder is unremarkable. Pancreas: Unremarkable. No pancreatic ductal dilatation or surrounding inflammatory changes. Spleen: Normal in size without focal abnormality. Adrenals/Urinary Tract: The adrenal glands unremarkable. There is no hydronephrosis on either side. There is symmetric enhancement and excretion of contrast by both kidneys. The visualized ureters and urinary bladder appear unremarkable. Stomach/Bowel: There are small scattered sigmoid and colonic diverticula without active inflammatory changes. Minimal thickened appearance of the sigmoid colon, likely related to underdistention. Mild colitis is  less likely. Clinical correlation is recommended. There is no bowel obstruction. The appendix is normal. Vascular/Lymphatic: Mild aortoiliac atherosclerotic disease. The visualized common femoral veins, iliac veins, and the IVC appear unremarkable for the degree of opacification. No obvious clot identified within the visualized portions of these veins. Evaluation is however somewhat limited due to suboptimal opacification. There is no portal venous gas. Several mildly rounded small retroperitoneal lymph nodes, indeterminate, possibly reactive. Clinical correlation is recommended. Reproductive: The prostate and seminal vesicles are grossly unremarkable. Other: Partially visualized small bilateral hydroceles. Mild diffuse subcutaneous edema. Musculoskeletal: Degenerative changes of the spine with lower lumbar disc desiccation and vacuum phenomena. No acute osseous pathology. IMPRESSION: 1. No acute intra-abdominal or pelvic pathology. No definite DVT identified in the visualized major veins. 2. No bowel obstruction. Normal appendix. 3. Colonic diverticulosis. 4. Mild fatty liver. 5. Aortic Atherosclerosis (ICD10-I70.0). Electronically Signed   By: Elgie Collard M.D.   On: 11/10/2019 15:13   VAS [place] LOWER EXTREMITY VENOUS (DVT) (ONLY [facility] & [facility])  Result Date: 11/10/2019  Lower Venous DVTStudy Indications: Pain, Swelling, and history of DVT.  Limitations:  Poor ultrasound/tissue interface. Comparison Study: 09/28/2019- acute DVT left distal femoral vein, left popliteal                   vein, left posterior tibial veins, left peroneal veins. Performing Technologist: Gertie Fey MHA, RDMS, RVT, RDCS  Examination Guidelines: A complete evaluation includes B-mode imaging, spectral Doppler, color Doppler, and power Doppler as needed of all accessible portions of each vessel. Bilateral testing is considered an integral part of a complete examination. Limited examinations for reoccurring indications may be performed  as noted. The reflux portion of the exam is performed with the patient in reverse Trendelenburg.  +---------+---------------+---------+-----------+----------+--------------+ RIGHT    CompressibilityPhasicitySpontaneityPropertiesThrombus Aging +---------+---------------+---------+-----------+----------+--------------+ CFV      Full           No       Yes                                 +---------+---------------+---------+-----------+----------+--------------+ SFJ      Full                                                        +---------+---------------+---------+-----------+----------+--------------+ FV Prox  Full                                                        +---------+---------------+---------+-----------+----------+--------------+ FV Mid   Full                                                        +---------+---------------+---------+-----------+----------+--------------+ FV DistalFull                                                        +---------+---------------+---------+-----------+----------+--------------+ PFV      Full                                                        +---------+---------------+---------+-----------+----------+--------------+ POP      Full           No       Yes                                 +---------+---------------+---------+-----------+----------+--------------+ PTV      Full                                                        +---------+---------------+---------+-----------+----------+--------------+  PERO     Full                                                        +---------+---------------+---------+-----------+----------+--------------+ CIV                              No                                  +---------+---------------+---------+-----------+----------+--------------+   +---------+---------------+---------+-----------+----------+--------------+ LEFT      CompressibilityPhasicitySpontaneityPropertiesThrombus Aging +---------+---------------+---------+-----------+----------+--------------+ CFV      Full           No       Yes                                 +---------+---------------+---------+-----------+----------+--------------+ SFJ      Full                                                        +---------+---------------+---------+-----------+----------+--------------+ FV Prox  Full                                                        +---------+---------------+---------+-----------+----------+--------------+ FV Mid   Full                                                        +---------+---------------+---------+-----------+----------+--------------+ FV DistalFull                                                        +---------+---------------+---------+-----------+----------+--------------+ PFV      Full                                                        +---------+---------------+---------+-----------+----------+--------------+ POP      None                    No                   Acute          +---------+---------------+---------+-----------+----------+--------------+ PTV                              No  Acute          +---------+---------------+---------+-----------+----------+--------------+ PERO                             No                   Acute          +---------+---------------+---------+-----------+----------+--------------+ CIV                              Yes                                 +---------+---------------+---------+-----------+----------+--------------+   Left Technical Findings: Not visualized segments include inadequate visualization of PTV and peroneal veins in transverse to perform adequate compression maneuvers.  Unable to visualize IVC due to overlying bowel gas.  Summary: RIGHT: - There is no evidence of deep vein thrombosis in  the lower extremity. However, portions of this examination were limited- see technologist comments above.  LEFT: - Findings consistent with acute deep vein thrombosis involving the left popliteal vein, left posterior tibial veins, and left peroneal veins. When compared to the prior study, there is no significant change. - No cystic structure found in the popliteal fossa.  Pulsatile lower extremity venous flow is suggestive of possibly elevated right heart pressure.  *See table(s) above for measurements and observations.    Preliminary     EKG: Independently reviewed.  Sinus tachycardia.  No ST elevations or depressions.  Assessment/Plan  Acute decompensated heart failure History of nonischemic cardiomyopathy with EF of 30 to 40% in April 2020 Anasarca -Patient presents with swelling of his bilateral feet and abdomen and has 2-3+ pitting edema -Has history of nonischemic cardiomyopathy with EF of 30 to 40% in April 2020 and had cardiac catheterization done at the time which showed normal coronaries -will start Lasix 80 mg IV every 12 hours.  Strict input and output.  Daily weights.  Fluid restriction. -Echocardiogram.  CT venogram of abdomen was negative for DVT.  CT angiogram of chest was negative for PE.  Might need cardiology input as well at some point once echocardiogram is performed.  Hypertensive emergency -extremely elevated blood pressure and presents with acute CHF. -Monitor blood pressure.  Continue metoprolol.  Lasix as above.  Use IV antihypertensives as needed.  History of left lower extremity DVT -Currently on Eliquis for the last month.  Continue Eliquis.  Hypokalemia -Replace.  Repeat a.m. labs  Macrocytosis -Probably from alcohol abuse.  Check vitamin B-12, TSH and folic acid levels in a.m.  Mild transaminitis -Probably from alcohol abuse.  Monitor.  Tobacco abuse -Counseled regarding tobacco cessation  Alcohol abuse -Patient is cutting down on his alcohol intake.   Currently drinks 3-5 beers a day.  Counseled regarding abstinence from alcohol as well.   DVT prophylaxis: Lovenox Code Status: Full Family Communication: None at bedside Disposition Plan: Inpatient for 2 to 3 days till clinically improves. Consults called: None Admission status: Inpatient/telemetry  Severity of Illness: The appropriate patient status for this patient is INPATIENT. Inpatient status is judged to be reasonable and necessary in order to provide the required intensity of service to ensure the patient's safety. The patient's presenting symptoms, physical exam findings, and initial radiographic and laboratory data in the context of their chronic comorbidities is felt to place them at high  risk for further clinical deterioration. Furthermore, it is not anticipated that the patient will be medically stable for discharge from the hospital within 2 midnights of admission. The following factors support the patient status of inpatient.   " The patient's presenting symptoms include bilateral leg swelling. " The worrisome physical exam findings include anasarca/pitting pedal edema. " The initial radiographic and laboratory data are worrisome because of CT venogram was negative for DVT, CT angiogram of chest was negative for PE " The chronic co-morbidities include history of nonischemic cardiomyopathy/left lower extremity DVT.   * I certify that at the point of admission it is my clinical judgment that the patient will require inpatient hospital care spanning beyond 2 midnights from the point of admission due to high intensity of service, high risk for further deterioration and high frequency of surveillance required.Glade Lloyd MD [facility]  11/10/2019, 3:40 PM

## 2019-11-10 NOTE — Progress Notes (Signed)
Bilateral lower extremity venous duplex completed. Refer to "CV Proc" under chart review to view preliminary results.  Preliminary results discussed with Dr. Rubin Payor.   11/10/2019 1:41 PM Eula Fried., MHA, RVT, RDCS, RDMS

## 2019-11-10 NOTE — ED Provider Notes (Signed)
[facility]-EMERGENCY DEPT Provider Note   CSN: [id number] Arrival date & time: 11/10/19  1101     History Chief Complaint  Patient presents with  . Leg Swelling    Jesus Fuller is a 44 y.o. male.  HPI Patient presents with swelling in both of his legs.  Had been seen about a month ago for swelling in his left leg diagnosed with a DVT and started on Eliquis.  Has been on Eliquis since then.  States over the last 2 days started have some pain up to the left hip and swelling in the right leg 2.  States he is having difficulty walking with the pain.  States his thighs feel full.  No real shortness of breath.  States that his abdomen feels swollen to.  Did have a history of nonischemic cardiomyopathy.  States he is on the blood pressure medicine his heart has been doing better.  He is a smoker.  Denies personal cancer history.    Past Medical History:  Diagnosis Date  . Asthma   . Hypertension    Takes no medicine    Patient Active Problem List   Diagnosis Date Noted  . Acute coronary syndrome (HCC) 10/23/2018    Past Surgical History:  Procedure Laterality Date  . RIGHT/LEFT HEART CATH AND CORONARY ANGIOGRAPHY N/A 10/24/2018   Procedure: RIGHT/LEFT HEART CATH AND CORONARY ANGIOGRAPHY;  Surgeon: Orpah Cobb, MD;  Location: [facility] INVASIVE CV LAB;  Service: Cardiovascular;  Laterality: N/A;       Family History  Problem Relation Age of Onset  . Cancer Mother   . COPD Mother   . Diabetes Father     Social History   Tobacco Use  . Smoking status: Current Every Day Smoker    Packs/day: 1.50    Types: Cigarettes  . Smokeless tobacco: Never Used  Substance Use Topics  . Alcohol use: Yes    Comment: occasional  . Drug use: No    Home Medications Prior to Admission medications   Medication Sig Start Date End Date Taking? Authorizing Provider  acetaminophen (TYLENOL) 500 MG tablet Take 500-1,000 mg by mouth every 6 (six) hours as needed for mild  pain.   Yes [provider]  apixaban (ELIQUIS) 5 MG TABS tablet Take 1 tablet (5 mg total) by mouth 2 (two) times daily. 10/15/19  Yes Arvilla Market, DO  metoprolol tartrate (LOPRESSOR) 25 MG tablet Take 1 tablet (25 mg total) by mouth 2 (two) times daily. Patient taking differently: Take 25 mg by mouth daily.  10/25/18  Yes Orpah Cobb, MD  potassium chloride SA (KLOR-CON) 20 MEQ tablet Take 1 tablet (20 mEq total) by mouth 2 (two) times daily. 10/16/19  Yes Arvilla Market, DO  atorvastatin (LIPITOR) 40 MG tablet Take 1 tablet (40 mg total) by mouth daily at 6 PM. Patient not taking: Reported on 03/27/2019 10/25/18 03/27/19  Orpah Cobb, MD  losartan (COZAAR) 25 MG tablet Take 1 tablet (25 mg total) by mouth daily. Patient not taking: Reported on 03/27/2019 10/25/18 03/27/19  Orpah Cobb, MD    Allergies    Patient has no known allergies.  Review of Systems   Review of Systems  Constitutional: Negative for appetite change.  HENT: Negative for congestion.   Respiratory: Negative for shortness of breath.   Cardiovascular: Positive for leg swelling.  Gastrointestinal: Positive for abdominal distention.  Genitourinary: Negative for frequency.  Musculoskeletal: Negative for back pain.  Skin: Negative for rash.  Neurological: Negative for weakness.  Psychiatric/Behavioral: Negative for confusion.    Physical Exam Updated Vital Signs BP (!) 186/117   Pulse 88   Temp 98 F (36.7 C) (Oral)   Resp (!) 25   SpO2 95%   Physical Exam Vitals and nursing note reviewed.  HENT:     Head: Atraumatic.  Eyes:     Pupils: Pupils are equal, round, and reactive to light.  Cardiovascular:     Rate and Rhythm: Normal rate and regular rhythm.  Pulmonary:     Breath sounds: No wheezing or rhonchi.  Abdominal:     Comments: Some fullness to abdomen potentially upper abdominal tenderness.  Anasarca up to lower abdomen.  Musculoskeletal:     Comments: Moderate severe edema  with some chronic changes bilateral lower extremities.  Worse on left however.  Tight thighs and calf due to edema.  Skin:    General: Skin is warm.     Capillary Refill: Capillary refill takes less than 2 seconds.  Neurological:     Mental Status: He is alert and oriented to person, place, and time.     ED Results / Procedures / Treatments   Labs (all labs ordered are listed, but only abnormal results are displayed) Labs Reviewed  COMPREHENSIVE METABOLIC PANEL - Abnormal; Notable for the following components:      Result Value   Potassium 3.3 (*)    Glucose, Bld 105 (*)    AST 48 (*)    ALT 45 (*)    All other components within normal limits  BRAIN NATRIURETIC PEPTIDE - Abnormal; Notable for the following components:   B Natriuretic Peptide 418.4 (*)    All other components within normal limits  CBC WITH DIFFERENTIAL/PLATELET - Abnormal; Notable for the following components:   RBC 3.65 (*)    MCV 115.3 (*)    MCH 40.8 (*)    All other components within normal limits  TROPONIN I (HIGH SENSITIVITY) - Abnormal; Notable for the following components:   Troponin I (High Sensitivity) 35 (*)    All other components within normal limits  URINALYSIS, ROUTINE W REFLEX MICROSCOPIC  TROPONIN I (HIGH SENSITIVITY)    EKG EKG Interpretation  Date/Time:  Sunday November 10 2019 12:18:26 EDT Ventricular Rate:  102 PR Interval:    QRS Duration: 92 QT Interval:  380 QTC Calculation: 495 R Axis:   68 Text Interpretation: Sinus tachycardia ST elev, probable normal early repol pattern Borderline prolonged QT interval Confirmed by Benjiman Core [phone]) on 11/10/2019 12:27:53 PM   Radiology DG Chest Portable 1 View  Result Date: 11/10/2019 CLINICAL DATA:  Tachycardia EXAM: PORTABLE CHEST 1 VIEW COMPARISON:  10/23/2018 FINDINGS: Cardiomegaly. Both lungs are clear. The visualized skeletal structures are unremarkable. IMPRESSION: Cardiomegaly without acute abnormality of the lungs in AP  portable projection. Electronically Signed   By: Lauralyn Primes M.D.   On: 11/10/2019 12:48    Procedures Procedures (including critical care time)  Medications Ordered in ED Medications - No data to display  ED Course  I have reviewed the triage vital signs and the nursing notes.  Pertinent labs & imaging results that were available during my care of the patient were reviewed by me and considered in my medical decision making (see chart for details).    MDM Rules/Calculators/A&P                      Patient presents with swelling in his legs.  Gotten bad and  he cannot walk due to it.  Has had previous DVT in left leg.  Ultrasound Doppler done and shows some improvement of the left DVT and no clot as high as the iliacs bilaterally.  However does have worsening swelling of both legs.  May have some elevated right heart pressures on Doppler also.  Patient is already on anticoagulation.  Has had history of nonischemic cardiomyopathy.  BNP is elevated.  Troponin also mildly elevated.  EKG shows mild tachycardia.  I feels the patient benefit from admission to the hospital.  Will discuss with hospitalist. Final Clinical Impression(s) / ED Diagnoses Final diagnoses:  Anasarca    Rx / DC Orders ED Discharge Orders    None       Davonna Belling, MD 11/10/19 1347

## 2019-11-10 NOTE — ED Notes (Signed)
Attempted to call report. Floor RN cannot take report at this time. Will call when ready.

## 2019-11-10 NOTE — ED Triage Notes (Signed)
Pt reports that had DVT in left leg and was started on Eliquis little over month ago. Reports past 3 days had bilat leg up to hips swelling and facial swelling.

## 2019-11-10 NOTE — ED Notes (Signed)
Pt unable to void; will continue to monitor.  

## 2019-11-10 NOTE — ED Notes (Signed)
ED TO INPATIENT HANDOFF REPORT  Name/Age/Gender Jesus Fuller 44 y.o. male  Code Status    Code Status Orders  (From admission, onward)         Start     Ordered   11/10/19 1535  Full code  Continuous     11/10/19 1539        Code Status History    Date Active Date Inactive Code Status Order ID Comments User Context   10/23/2018 1959 10/25/2018 1712 Full Code [id number]  Orpah Cobb, MD ED   Advance Care Planning Activity      Home/SNF/Other Home  Chief Complaint CHF exacerbation [facility]) [I50.9]  Level of Care/Admitting Diagnosis ED Disposition    ED Disposition Condition Comment   Admit  Hospital Area: [facility] [[id number]]  Level of Care: Telemetry [5]  Admit to tele based on following criteria: Acute CHF  May admit patient to [facility] or [facility] if equivalent level of care is available:: Yes  Covid Evaluation: Asymptomatic Screening Protocol (No Symptoms)  Diagnosis: CHF exacerbation [facility]) [[id number]]  Admitting Physician: Glade Lloyd [[id number]]  Attending Physician: Glade Lloyd [[id number]]  Estimated length of stay: 3 - 4 days  Certification:: I certify this patient will need inpatient services for at least 2 midnights       Medical History Past Medical History:  Diagnosis Date  . Asthma   . Hypertension    Takes no medicine    Allergies No Known Allergies  IV Location/Drains/Wounds Patient Lines/Drains/Airways Status   Active Line/Drains/Airways    Name:   Placement date:   Placement time:   Site:   Days:   Peripheral IV 11/10/19 Left Antecubital   11/10/19    1236    Antecubital   less than 1          Labs/Imaging Results for orders placed or performed during the hospital encounter of 11/10/19 (from the past 48 hour(s))  Comprehensive metabolic panel     Status: Abnormal   Collection Time: 11/10/19 12:37 PM  Result Value Ref Range   Sodium 142 135 - 145 mmol/L   Potassium 3.3 (L) 3.5 - 5.1 mmol/L   Chloride 101  98 - 111 mmol/L   CO2 30 22 - 32 mmol/L   Glucose, Bld 105 (H) 70 - 99 mg/dL    Comment: Glucose reference range applies only to samples taken after fasting for at least 8 hours.   BUN 6 6 - 20 mg/dL   Creatinine, Ser 3.71 0.61 - 1.24 mg/dL   Calcium 9.3 8.9 - 06.2 mg/dL   Total Protein 6.8 6.5 - 8.1 g/dL   Albumin 3.9 3.5 - 5.0 g/dL   AST 48 (H) 15 - 41 U/L   ALT 45 (H) 0 - 44 U/L   Alkaline Phosphatase 67 38 - 126 U/L   Total Bilirubin 1.0 0.3 - 1.2 mg/dL   GFR calc non Af Amer >60 >60 mL/min   GFR calc Af Amer >60 >60 mL/min   Anion gap 11 5 - 15    Comment: Performed at [facility], [address] [place], [place] [address]  Troponin I (High Sensitivity)     Status: Abnormal   Collection Time: 11/10/19 12:37 PM  Result Value Ref Range   Troponin I (High Sensitivity) 35 (H) <18 ng/L    Comment: (NOTE) Elevated high sensitivity troponin I (hsTnI) values and significant  changes across serial measurements may suggest ACS but many other  chronic and acute conditions are known to elevate hsTnI results.  Refer to the "Links" section for chest pain algorithms and additional  guidance. Performed at [facility], [address], [place], [place] [address]   Brain natriuretic peptide     Status: Abnormal   Collection Time: 11/10/19 12:37 PM  Result Value Ref Range   B Natriuretic Peptide 418.4 (H) 0.0 - 100.0 pg/mL    Comment: Performed at [facility], [address] [place], [place] [address]  CBC with Differential     Status: Abnormal   Collection Time: 11/10/19 12:37 PM  Result Value Ref Range   WBC 7.1 4.0 - 10.5 K/uL   RBC 3.65 (L) 4.22 - 5.81 MIL/uL   Hemoglobin 14.9 13.0 - 17.0 g/dL   HCT 10.9 32.3 - 55.7 %   MCV 115.3 (H) 80.0 - 100.0 fL   MCH 40.8 (H) 26.0 - 34.0 pg   MCHC 35.4 30.0 - 36.0 g/dL   RDW 32.2 02.5 - 42.7 %   Platelets 225 150 - 400 K/uL   nRBC 0.0 0.0 - 0.2 %   Neutrophils Relative % 73 %    Neutro Abs 5.2 1.7 - 7.7 K/uL   Lymphocytes Relative 17 %   Lymphs Abs 1.2 0.7 - 4.0 K/uL   Monocytes Relative 8 %   Monocytes Absolute 0.6 0.1 - 1.0 K/uL   Eosinophils Relative 1 %   Eosinophils Absolute 0.1 0.0 - 0.5 K/uL   Basophils Relative 1 %   Basophils Absolute 0.1 0.0 - 0.1 K/uL   Immature Granulocytes 0 %   Abs Immature Granulocytes 0.02 0.00 - 0.07 K/uL    Comment: Performed at [facility], [address], [place], [place] [address]  Urinalysis, Routine w reflex microscopic     Status: None   Collection Time: 11/10/19  2:38 PM  Result Value Ref Range   Color, Urine YELLOW YELLOW   APPearance CLEAR CLEAR   Specific Gravity, Urine 1.015 1.005 - 1.030   pH 7.0 5.0 - 8.0   Glucose, UA NEGATIVE NEGATIVE mg/dL   Hgb urine dipstick NEGATIVE NEGATIVE   Bilirubin Urine NEGATIVE NEGATIVE   Ketones, ur NEGATIVE NEGATIVE mg/dL   Protein, ur NEGATIVE NEGATIVE mg/dL   Nitrite NEGATIVE NEGATIVE   Leukocytes,Ua NEGATIVE NEGATIVE    Comment: Performed at [facility], [address] [place], [place] [address]  Troponin I (High Sensitivity)     Status: Abnormal   Collection Time: 11/10/19  2:38 PM  Result Value Ref Range   Troponin I (High Sensitivity) 37 (H) <18 ng/L    Comment: (NOTE) Elevated high sensitivity troponin I (hsTnI) values and significant  changes across serial measurements may suggest ACS but many other  chronic and acute conditions are known to elevate hsTnI results.  Refer to the "Links" section for chest pain algorithms and additional  guidance. Performed at [facility], [address] [place], [place] [address]    CT Angio Chest PE W and/or Wo Contrast  Result Date: 11/10/2019 CLINICAL DATA:  Short of breath, tachycardia EXAM: CT ANGIOGRAPHY CHEST WITH CONTRAST TECHNIQUE: Multidetector CT imaging of the chest was performed using the standard protocol during bolus administration of intravenous  contrast. Multiplanar CT image reconstructions and MIPs were obtained to evaluate the vascular anatomy. CONTRAST:  OMNIPAQUE IOHEXOL 350 MG/ML SOLN COMPARISON:  11/10/2019 FINDINGS: Cardiovascular: This is a technically adequate evaluation of the pulmonary vasculature. No filling defects  or pulmonary emboli. Heart is unremarkable without pericardial effusion. Normal caliber of the thoracic aorta. Mediastinum/Nodes: Numerous borderline enlarged hilar and mediastinal lymph nodes, nonspecific. Largest at the AP window measures 13 mm in short axis. Trachea, esophagus, and thyroid are unremarkable. Lungs/Pleura: No acute airspace disease, effusion, or pneumothorax. Central airways are widely patent. Upper Abdomen: No acute abnormality. Musculoskeletal: No acute or destructive bony lesions. Reconstructed images demonstrate no additional findings. Review of the MIP images confirms the above findings. IMPRESSION: 1. No CT evidence of pulmonary embolism. 2. No acute airspace disease. 3. Numerous borderline enlarged mediastinal and hilar lymph nodes, nonspecific. Electronically Signed   By: Sharlet Salina M.D.   On: 11/10/2019 15:05   DG Chest Portable 1 View  Result Date: 11/10/2019 CLINICAL DATA:  Tachycardia EXAM: PORTABLE CHEST 1 VIEW COMPARISON:  10/23/2018 FINDINGS: Cardiomegaly. Both lungs are clear. The visualized skeletal structures are unremarkable. IMPRESSION: Cardiomegaly without acute abnormality of the lungs in AP portable projection. Electronically Signed   By: Lauralyn Primes M.D.   On: 11/10/2019 12:48   CT VENOGRAM ABD/PEL  Result Date: 11/10/2019 CLINICAL DATA:  44 year old male with bilateral lower extremity swelling and concern for DVT. EXAM: CT ABDOMEN AND PELVIS WITH CONTRAST TECHNIQUE: Multidetector CT imaging of the abdomen and pelvis was performed using the standard protocol following bolus administration of intravenous contrast. CONTRAST:  OMNIPAQUE IOHEXOL 350 MG/ML SOLN  COMPARISON:  None. FINDINGS: Lower chest: Minimal bibasilar dependent atelectasis. The visualized lung bases are otherwise clear. No intra-abdominal free air or free fluid. Hepatobiliary: Mild fatty infiltration of the liver. No intrahepatic biliary ductal dilatation. The gallbladder is unremarkable. Pancreas: Unremarkable. No pancreatic ductal dilatation or surrounding inflammatory changes. Spleen: Normal in size without focal abnormality. Adrenals/Urinary Tract: The adrenal glands unremarkable. There is no hydronephrosis on either side. There is symmetric enhancement and excretion of contrast by both kidneys. The visualized ureters and urinary bladder appear unremarkable. Stomach/Bowel: There are small scattered sigmoid and colonic diverticula without active inflammatory changes. Minimal thickened appearance of the sigmoid colon, likely related to underdistention. Mild colitis is less likely. Clinical correlation is recommended. There is no bowel obstruction. The appendix is normal. Vascular/Lymphatic: Mild aortoiliac atherosclerotic disease. The visualized common femoral veins, iliac veins, and the IVC appear unremarkable for the degree of opacification. No obvious clot identified within the visualized portions of these veins. Evaluation is however somewhat limited due to suboptimal opacification. There is no portal venous gas. Several mildly rounded small retroperitoneal lymph nodes, indeterminate, possibly reactive. Clinical correlation is recommended. Reproductive: The prostate and seminal vesicles are grossly unremarkable. Other: Partially visualized small bilateral hydroceles. Mild diffuse subcutaneous edema. Musculoskeletal: Degenerative changes of the spine with lower lumbar disc desiccation and vacuum phenomena. No acute osseous pathology. IMPRESSION: 1. No acute intra-abdominal or pelvic pathology. No definite DVT identified in the visualized major veins. 2. No bowel obstruction. Normal appendix. 3.  Colonic diverticulosis. 4. Mild fatty liver. 5. Aortic Atherosclerosis (ICD10-I70.0). Electronically Signed   By: Elgie Collard M.D.   On: 11/10/2019 15:13   VAS [place] LOWER EXTREMITY VENOUS (DVT) (ONLY [facility] & [facility])  Result Date: 11/10/2019  Lower Venous DVTStudy Indications: Pain, Swelling, and history of DVT.  Limitations: Poor ultrasound/tissue interface. Comparison Study: 09/28/2019- acute DVT left distal femoral vein, left popliteal                   vein, left posterior tibial veins, left peroneal veins. Performing Technologist: Gertie Fey MHA, RDMS, RVT, RDCS  Examination Guidelines: A  complete evaluation includes B-mode imaging, spectral Doppler, color Doppler, and power Doppler as needed of all accessible portions of each vessel. Bilateral testing is considered an integral part of a complete examination. Limited examinations for reoccurring indications may be performed as noted. The reflux portion of the exam is performed with the patient in reverse Trendelenburg.  +---------+---------------+---------+-----------+----------+--------------+ RIGHT    CompressibilityPhasicitySpontaneityPropertiesThrombus Aging +---------+---------------+---------+-----------+----------+--------------+ CFV      Full           No       Yes                                 +---------+---------------+---------+-----------+----------+--------------+ SFJ      Full                                                        +---------+---------------+---------+-----------+----------+--------------+ FV Prox  Full                                                        +---------+---------------+---------+-----------+----------+--------------+ FV Mid   Full                                                        +---------+---------------+---------+-----------+----------+--------------+ FV DistalFull                                                         +---------+---------------+---------+-----------+----------+--------------+ PFV      Full                                                        +---------+---------------+---------+-----------+----------+--------------+ POP      Full           No       Yes                                 +---------+---------------+---------+-----------+----------+--------------+ PTV      Full                                                        +---------+---------------+---------+-----------+----------+--------------+ PERO     Full                                                        +---------+---------------+---------+-----------+----------+--------------+  CIV                              No                                  +---------+---------------+---------+-----------+----------+--------------+   +---------+---------------+---------+-----------+----------+--------------+ LEFT     CompressibilityPhasicitySpontaneityPropertiesThrombus Aging +---------+---------------+---------+-----------+----------+--------------+ CFV      Full           No       Yes                                 +---------+---------------+---------+-----------+----------+--------------+ SFJ      Full                                                        +---------+---------------+---------+-----------+----------+--------------+ FV Prox  Full                                                        +---------+---------------+---------+-----------+----------+--------------+ FV Mid   Full                                                        +---------+---------------+---------+-----------+----------+--------------+ FV DistalFull                                                        +---------+---------------+---------+-----------+----------+--------------+ PFV      Full                                                         +---------+---------------+---------+-----------+----------+--------------+ POP      None                    No                   Acute          +---------+---------------+---------+-----------+----------+--------------+ PTV                              No                   Acute          +---------+---------------+---------+-----------+----------+--------------+ PERO                             No  Acute          +---------+---------------+---------+-----------+----------+--------------+ CIV                              Yes                                 +---------+---------------+---------+-----------+----------+--------------+   Left Technical Findings: Not visualized segments include inadequate visualization of PTV and peroneal veins in transverse to perform adequate compression maneuvers.  Unable to visualize IVC due to overlying bowel gas.  Summary: RIGHT: - There is no evidence of deep vein thrombosis in the lower extremity. However, portions of this examination were limited- see technologist comments above.  LEFT: - Findings consistent with acute deep vein thrombosis involving the left popliteal vein, left posterior tibial veins, and left peroneal veins. When compared to the prior study, there is no significant change. - No cystic structure found in the popliteal fossa.  Pulsatile lower extremity venous flow is suggestive of possibly elevated right heart pressure.  *See table(s) above for measurements and observations.    Preliminary     Pending Labs Unresulted Labs (From admission, onward)    Start     Ordered   11/11/19 0500  Comprehensive metabolic panel  Tomorrow morning,   R     11/10/19 1539   11/11/19 0500  CBC  Tomorrow morning,   R     11/10/19 1539   11/11/19 0500  Magnesium  Tomorrow morning,   R     11/10/19 1554   11/11/19 0500  Vitamin B12  Tomorrow morning,   R     11/10/19 1554   11/11/19 0500  Folate  Tomorrow morning,   R      11/10/19 1554   11/11/19 0500  TSH  Tomorrow morning,   R     11/10/19 1554   11/10/19 1404  SARS CORONAVIRUS 2 (TAT 6-24 HRS) Nasopharyngeal  (Tier 3 (TAT 6-24 hrs))  Once,   STAT    Question Answer Comment  Is this test for diagnosis or screening Screening   Symptomatic for COVID-19 as defined by CDC No   Hospitalized for COVID-19 No   Admitted to ICU for COVID-19 No   Previously tested for COVID-19 No   Resident in a congregate (group) care setting No   Employed in healthcare setting No   Has patient completed COVID vaccination(s) (2 doses of Pfizer/Moderna 1 dose of [employer]) No      11/10/19 1403          Vitals/Pain Today's Vitals   11/10/19 1330 11/10/19 1430 11/10/19 1500 11/10/19 1600  BP: (!) 186/117 (!) 188/132 (!) 175/130 (!) 175/124  Pulse: 88 (!) 106 80 89  Resp: (!) 25 (!) 24 20 16   Temp:      TempSrc:      SpO2: 95% 95% 95% 95%  PainSc:        Isolation Precautions No active isolations  Medications Medications  sodium chloride (PF) 0.9 % injection (has no administration in time range)  furosemide (LASIX) injection 80 mg (has no administration in time range)  metoprolol tartrate (LOPRESSOR) tablet 25 mg (has no administration in time range)  apixaban (ELIQUIS) tablet 5 mg (has no administration in time range)  potassium chloride SA (KLOR-CON) CR tablet 20 mEq (has no administration in time range)  ondansetron (ZOFRAN) tablet 4 mg (  has no administration in time range)    Or  ondansetron (ZOFRAN) injection 4 mg (has no administration in time range)  traMADol (ULTRAM) tablet 50 mg (has no administration in time range)  hydrALAZINE (APRESOLINE) injection 5 mg (has no administration in time range)  iohexol (OMNIPAQUE) 350 MG/ML injection 125 mL (125 mLs Intravenous Contrast Given 11/10/19 1407)    Mobility walks

## 2019-11-11 ENCOUNTER — Inpatient Hospital Stay (HOSPITAL_COMMUNITY): Payer: Self-pay

## 2019-11-11 LAB — CBC
HCT: 44.2 % (ref 39.0–52.0)
Hemoglobin: 15.2 g/dL (ref 13.0–17.0)
MCH: 40.2 pg — ABNORMAL HIGH (ref 26.0–34.0)
MCHC: 34.4 g/dL (ref 30.0–36.0)
MCV: 116.9 fL — ABNORMAL HIGH (ref 80.0–100.0)
Platelets: 216 10*3/uL (ref 150–400)
RBC: 3.78 MIL/uL — ABNORMAL LOW (ref 4.22–5.81)
RDW: 13.3 % (ref 11.5–15.5)
WBC: 6.3 10*3/uL (ref 4.0–10.5)
nRBC: 0 % (ref 0.0–0.2)

## 2019-11-11 LAB — COMPREHENSIVE METABOLIC PANEL
ALT: 39 U/L (ref 0–44)
AST: 42 U/L — ABNORMAL HIGH (ref 15–41)
Albumin: 4.2 g/dL (ref 3.5–5.0)
Alkaline Phosphatase: 70 U/L (ref 38–126)
Anion gap: 10 (ref 5–15)
BUN: 10 mg/dL (ref 6–20)
CO2: 35 mmol/L — ABNORMAL HIGH (ref 22–32)
Calcium: 9.7 mg/dL (ref 8.9–10.3)
Chloride: 95 mmol/L — ABNORMAL LOW (ref 98–111)
Creatinine, Ser: 1 mg/dL (ref 0.61–1.24)
GFR calc Af Amer: >60 mL/min (ref >60)
GFR calc non Af Amer: >60 mL/min (ref >60)
Glucose, Bld: 113 mg/dL — ABNORMAL HIGH (ref 70–99)
Potassium: 3.3 mmol/L — ABNORMAL LOW (ref 3.5–5.1)
Sodium: 140 mmol/L (ref 135–145)
Total Bilirubin: 2.1 mg/dL — ABNORMAL HIGH (ref 0.3–1.2)
Total Protein: 7.1 g/dL (ref 6.5–8.1)

## 2019-11-11 LAB — MAGNESIUM: Magnesium: 1.7 mg/dL (ref 1.7–2.4)

## 2019-11-11 LAB — FOLATE: Folate: 7.1 ng/mL (ref >5.9)

## 2019-11-11 LAB — ECHOCARDIOGRAM COMPLETE
Height: 69 in
Weight: 3675.51 oz

## 2019-11-11 LAB — TSH: TSH: 2.775 u[IU]/mL (ref 0.350–4.500)

## 2019-11-11 LAB — SARS CORONAVIRUS 2 (TAT 6-24 HRS): SARS Coronavirus 2: NEGATIVE

## 2019-11-11 LAB — VITAMIN B12: Vitamin B-12: 144 pg/mL — ABNORMAL LOW (ref 180–914)

## 2019-11-11 MED ORDER — PERFLUTREN LIPID MICROSPHERE
1.0000 mL | INTRAVENOUS | Status: AC | PRN
  Administered 2019-11-11: 09:00:00 2 mL via INTRAVENOUS
  Filled 2019-11-11: qty 10

## 2019-11-11 MED ORDER — AMLODIPINE BESYLATE 5 MG PO TABS
5.0000 mg | ORAL_TABLET | Freq: Every day | ORAL | Status: DC
  Administered 2019-11-11: 5 mg via ORAL
  Filled 2019-11-11: qty 1

## 2019-11-11 MED ORDER — MAGNESIUM SULFATE 2 GM/50ML IV SOLN
2.0000 g | Freq: Once | INTRAVENOUS | Status: AC
  Administered 2019-11-11: 15:00:00 2 g via INTRAVENOUS
  Filled 2019-11-11: qty 50

## 2019-11-11 MED ORDER — ALUM & MAG HYDROXIDE-SIMETH 200-200-20 MG/5ML PO SUSP
15.0000 mL | Freq: Four times a day (QID) | ORAL | Status: DC | PRN
  Administered 2019-11-11: 15 mL via ORAL
  Filled 2019-11-11: qty 30

## 2019-11-11 MED ORDER — LOSARTAN POTASSIUM 50 MG PO TABS
50.0000 mg | ORAL_TABLET | Freq: Every day | ORAL | Status: DC
  Administered 2019-11-11 – 2019-11-12 (×2): 50 mg via ORAL
  Filled 2019-11-11 (×2): qty 1

## 2019-11-11 MED ORDER — POTASSIUM CHLORIDE CRYS ER 20 MEQ PO TBCR
30.0000 meq | EXTENDED_RELEASE_TABLET | Freq: Two times a day (BID) | ORAL | Status: DC
  Administered 2019-11-11 – 2019-11-12 (×2): 30 meq via ORAL
  Filled 2019-11-11 (×2): qty 1

## 2019-11-11 NOTE — Progress Notes (Signed)
  Echocardiogram 2D Echocardiogram has been performed.  Jesus Fuller A [name] 11/11/2019, 8:57 AM

## 2019-11-11 NOTE — Progress Notes (Signed)
Patient mews was yellow due to telemetry documentation of SB/HR 32, 2nd degree AVB. Pt  was in deep sleep. Asymptomatic. VS and EKG done, WNL. NP on call made aware.

## 2019-11-11 NOTE — Progress Notes (Signed)
PT Cancellation Note / Screen  Patient Details Name: LEEANDRE NORDLING MRN: [id number] DOB: 12/23/1975   Cancelled Treatment:    Reason Eval/Treat Not Completed: PT screened, no needs identified, will sign off Pt and RN reports pt is up in room ad lib.  Pt denies any PT needs at this time.  PT to sign off.   Berlie Persky,KATHrine E 11/11/2019, 11:53 AM Paulino Door, DPT Acute Rehabilitation Services Office: [phone]

## 2019-11-11 NOTE — Progress Notes (Signed)
PROGRESS NOTE    Jesus Fuller  ZOX:[id number] DOB: [birth year] DOA: 11/10/2019 PCP: Arvilla Market, DO   Brief Narrative: 44 y.o. male with medical history significant of hypertension, nonischemic cardiomyopathy with EF of 35 to 40% on 10/25/2018 with subsequent cardiac catheterization showing normal coronaries; tobacco abuse, alcohol abuse, left leg DVT diagnosed a month ago currently on Eliquis presented with bilateral worsening leg swelling.  He states that he has been having worsening bilateral leg swelling for the last 3 days, more on the right side along with difficulty walking and sharp pains.  He feels that his thighs are full and also feels that his hips and abdomen has swollen.  Denies any real shortness of breath or chest pain.  No fever, nausea, vomiting, diarrhea.  Has not noticed any decrease or increased urine output.  Has been compliant with his Eliquis.  No loss of consciousness, palpitations, blurring of vision, seizures.  ED Course: Pressure was extremely elevated.  Ultrasound Doppler showed improvement of the left DVT and no clot as high as the iliacs bilaterally.  CT venogram of the abdomen was negative for acute pathology or DVT and CTA chest was negative for PE.  Hospitalist service was called to evaluate the patient  Assessment & Plan:   Principal Problem:   CHF exacerbation (HCC) Active Problems:   Hypertensive emergency   Tobacco abuse    #1 acute on chronic diastolic heart failure exacerbation likely secondary to uncontrolled hypertension.  Patient with history of nonischemic cardiomyopathy with normal coronaries in April 2020.   CT venogram of the abdomen negative for DVT.  CT angiogram of the chest negative for pulmonary embolism. Continue Lasix 80 mg every 12.  Monitor renal functions closely.  Echo done 11/11/2019 actually with improvement in his ejection fraction to 45 to 50%.  Left ventricle regional wall motion abnormalities demonstrated.  Grade 2  diastolic dysfunction.  Continue IV Lasix increased potassium replacement replace magnesium.  I's and O's negative Foley by 300 cc.  #2 uncontrolled hypertension -blood pressure 160/93 continue Lasix  #3 left lower extremity DVT continue Eliquis  #4 alcohol use patient reports he is cut down his drinking way down from little drinking hard liquor to 3-4 beers daily.  #5 tobacco abuse nicotine patch  #1 hypokalemia and hypomagnesemia potassium 3.3 magnesium 1.7 replete   Estimated body mass index is 33.92 kg/m as calculated from the following:   Height as of this encounter:  (1.753 m).   Weight as of this encounter: 104.2 kg.  DVT prophylaxis: Lovenox  code Status: Full code Family Communication: Discussed with patient disposition Plan: Patient admitted from home Plan to discharge home Admitted with acute CHF Barrier to discharge acute CHF on  diuretics IV  Consultants:   Dr. Algie Coffer  Procedures: None Antimicrobials: None Subjective: Patient resting in bed reports he is better than yesterday however both legs are still swollen short of breath dyspneic on exertion.  Objective: Vitals:   11/11/19 0040 11/11/19 0040 11/11/19 0427 11/11/19 0442  BP: (!) 156/92 (!) 156/92 (!) 160/93   Pulse: 66 66 77   Resp: 20  (!) 22   Temp: 98.4 F (36.9 C)  97.8 F (36.6 C)   TempSrc: Oral  Oral   SpO2: 94% 94% 94%   Weight:    104.2 kg  Height:        Intake/Output Summary (Last 24 hours) at 11/11/2019 1228 Last data filed at 11/11/2019 1048 Gross per 24 hour  Intake 760  ml  Output 1100 ml  Net -340 ml   Filed Weights   11/10/19 1748 11/11/19 0442  Weight: 109.9 kg 104.2 kg    Examination:  General exam: Appears calm and comfortable  Respiratory system: Diminished breath sounds at the bases  cardiovascular system: S1 & S2 heard, RRR. No JVD, murmurs, rubs, gallops or clicks. No pedal edema. Gastrointestinal system: Abdomen is nondistended, soft and nontender. No  organomegaly or masses felt. Normal bowel sounds heard. Central nervous system: Alert and oriented. No focal neurological deficits. Extremities 2+ pitting edema Skin: No rashes, lesions or ulcers Psychiatry: Judgement and insight appear normal. Mood & affect appropriate.     Data Reviewed: I have personally reviewed following labs and imaging studies  CBC: Recent Labs  Lab 11/10/19 1237 11/11/19 0924  WBC 7.1 6.3  NEUTROABS 5.2  --   HGB 14.9 15.2  HCT 42.1 44.2  MCV 115.3* 116.9*  PLT 225 216   Basic Metabolic Panel: Recent Labs  Lab 11/10/19 1237 11/11/19 0924  NA 142 140  K 3.3* 3.3*  CL 101 95*  CO2 30 35*  GLUCOSE 105* 113*  BUN 6 10  CREATININE 0.81 1.00  CALCIUM 9.3 9.7  MG  --  1.7   GFR: Estimated Creatinine Clearance: 115.6 mL/min (by C-G formula based on SCr of 1 mg/dL). Liver Function Tests: Recent Labs  Lab 11/10/19 1237 11/11/19 0924  AST 48* 42*  ALT 45* 39  ALKPHOS 67 70  BILITOT 1.0 2.1*  PROT 6.8 7.1  ALBUMIN 3.9 4.2   No results for input(s): LIPASE, AMYLASE in the last 168 hours. No results for input(s): AMMONIA in the last 168 hours. Coagulation Profile: No results for input(s): INR, PROTIME in the last 168 hours. Cardiac Enzymes: No results for input(s): CKTOTAL, CKMB, CKMBINDEX, TROPONINI in the last 168 hours. BNP (last 3 results) No results for input(s): PROBNP in the last 8760 hours. HbA1C: No results for input(s): HGBA1C in the last 72 hours. CBG: No results for input(s): GLUCAP in the last 168 hours. Lipid Profile: No results for input(s): CHOL, HDL, LDLCALC, TRIG, CHOLHDL, LDLDIRECT in the last 72 hours. Thyroid Function Tests: Recent Labs    11/11/19 0924  TSH 2.775   Anemia Panel: Recent Labs    11/11/19 0924  VITAMINB12 144*  FOLATE 7.1   Sepsis Labs: No results for input(s): PROCALCITON, LATICACIDVEN in the last 168 hours.  Recent Results (from the past 240 hour(s))  SARS CORONAVIRUS 2 (TAT 6-24 HRS)  Nasopharyngeal     Status: None   Collection Time: 11/10/19  2:38 PM   Specimen: Nasopharyngeal  Result Value Ref Range Status   SARS Coronavirus 2 NEGATIVE NEGATIVE Final    Comment: (NOTE) SARS-CoV-2 target nucleic acids are NOT DETECTED. The SARS-CoV-2 RNA is generally detectable in upper and lower respiratory specimens during the acute phase of infection. Negative results do not preclude SARS-CoV-2 infection, do not rule out co-infections with other pathogens, and should not be used as the sole basis for treatment or other patient management decisions. Negative results must be combined with clinical observations, patient history, and epidemiological information. The expected result is Negative. Fact Sheet for Patients: HairSlick.no Fact Sheet for Healthcare Providers: quierodirigir.com This test is not yet approved or cleared by the [place] FDA and  has been authorized for detection and/or diagnosis of SARS-CoV-2 by FDA under an Emergency Use Authorization (EUA). This EUA will remain  in effect (meaning this test can be used) for the  duration of the COVID-19 declaration under Section 56 4(b)(1) of the Act, 21 U.S.C. section 360bbb-3(b)(1), unless the authorization is terminated or revoked sooner. Performed at [facility], [address] [place], [place] [address]          Radiology Studies: CT Angio Chest PE W and/or Wo Contrast  Result Date: 11/10/2019 CLINICAL DATA:  Short of breath, tachycardia EXAM: CT ANGIOGRAPHY CHEST WITH CONTRAST TECHNIQUE: Multidetector CT imaging of the chest was performed using the standard protocol during bolus administration of intravenous contrast. Multiplanar CT image reconstructions and MIPs were obtained to evaluate the vascular anatomy. CONTRAST:  OMNIPAQUE IOHEXOL 350 MG/ML SOLN COMPARISON:  11/10/2019 FINDINGS: Cardiovascular: This is a technically adequate  evaluation of the pulmonary vasculature. No filling defects or pulmonary emboli. Heart is unremarkable without pericardial effusion. Normal caliber of the thoracic aorta. Mediastinum/Nodes: Numerous borderline enlarged hilar and mediastinal lymph nodes, nonspecific. Largest at the AP window measures 13 mm in short axis. Trachea, esophagus, and thyroid are unremarkable. Lungs/Pleura: No acute airspace disease, effusion, or pneumothorax. Central airways are widely patent. Upper Abdomen: No acute abnormality. Musculoskeletal: No acute or destructive bony lesions. Reconstructed images demonstrate no additional findings. Review of the MIP images confirms the above findings. IMPRESSION: 1. No CT evidence of pulmonary embolism. 2. No acute airspace disease. 3. Numerous borderline enlarged mediastinal and hilar lymph nodes, nonspecific. Electronically Signed   By: Sharlet Salina M.D.   On: 11/10/2019 15:05   DG Chest Portable 1 View  Result Date: 11/10/2019 CLINICAL DATA:  Tachycardia EXAM: PORTABLE CHEST 1 VIEW COMPARISON:  10/23/2018 FINDINGS: Cardiomegaly. Both lungs are clear. The visualized skeletal structures are unremarkable. IMPRESSION: Cardiomegaly without acute abnormality of the lungs in AP portable projection. Electronically Signed   By: Lauralyn Primes M.D.   On: 11/10/2019 12:48   ECHOCARDIOGRAM COMPLETE  Result Date: 11/11/2019    ECHOCARDIOGRAM REPORT   Patient Name:   WEYLYN RICCIUTI Date of Exam: 11/11/2019 Medical Rec #:  [id number]      Height:       69.0 in Accession #:    [id number]     Weight:       229.7 lb Date of Birth:  November 04, 1975      BSA:          2.191 m Patient Age:    43 years       BP:           160/93 mmHg Patient Gender: M              HR:           77 bpm. Exam Location:  Inpatient Procedure: 2D Echo and Intracardiac Opacification Agent Indications:     CHF-Acute Diastolic 428.31 / I50.31  History:         Patient has prior history of Echocardiogram examinations, most                   recent 10/25/2018. Risk Factors:Hypertension and Current Smoker.                  Nonischemic cardiomyopathy, ETOH Abuse, Asthma.  Sonographer:     Leeroy Bock Turrentine Referring Phys:  [id number] Glade Lloyd Diagnosing Phys: Orpah Cobb MD IMPRESSIONS  1. Left ventricular ejection fraction, by estimation, is 45 to 50%. The left ventricle has mildly decreased function. The left ventricle demonstrates regional wall motion abnormalities (see scoring diagram/findings for description). Left ventricular diastolic parameters are consistent with Grade II  diastolic dysfunction (pseudonormalization). There is mild hypokinesis of the left ventricular, basal anterior wall, septal wall and inferior wall.  2. Right ventricular systolic function is mildly reduced. The right ventricular size is mildly enlarged.  3. Left atrial size was moderately dilated.  4. Right atrial size was moderately dilated.  5. The mitral valve is normal in structure. Trivial mitral valve regurgitation.  6. The aortic valve is normal in structure. Aortic valve regurgitation is not visualized.  7. The inferior vena cava is normal in size with greater than 50% respiratory variability, suggesting right atrial pressure of 3 mmHg. FINDINGS  Left Ventricle: Left ventricular ejection fraction, by estimation, is 45 to 50%. The left ventricle has mildly decreased function. The left ventricle demonstrates regional wall motion abnormalities. Mild hypokinesis of the left ventricular, basal anterior wall, septal wall and inferior wall. Definity contrast agent was given IV to delineate the left ventricular endocardial borders. The left ventricular internal cavity size was normal in size. There is no left ventricular hypertrophy. Left ventricular diastolic parameters are consistent with Grade II diastolic dysfunction (pseudonormalization).  LV Wall Scoring: The basal anteroseptal segment, basal inferolateral segment, basal anterolateral segment, basal anterior segment,  basal inferior segment, and basal inferoseptal segment are hypokinetic. The mid and distal anterior wall, mid and distal lateral wall, mid and distal anterior septum, entire apex, mid and distal inferior wall, mid anterolateral segment, and mid inferoseptal segment are normal. Right Ventricle: The right ventricular size is mildly enlarged. No increase in right ventricular wall thickness. Right ventricular systolic function is mildly reduced. Left Atrium: Left atrial size was moderately dilated. Right Atrium: Right atrial size was moderately dilated. Pericardium: There is no evidence of pericardial effusion. Mitral Valve: The mitral valve is normal in structure. Trivial mitral valve regurgitation. Tricuspid Valve: The tricuspid valve is normal in structure. Tricuspid valve regurgitation is trivial. Aortic Valve: The aortic valve is normal in structure. Aortic valve regurgitation is not visualized. Pulmonic Valve: The pulmonic valve was grossly normal. Pulmonic valve regurgitation is not visualized. Aorta: The aortic root is normal in size and structure. Venous: The inferior vena cava is normal in size with greater than 50% respiratory variability, suggesting right atrial pressure of 3 mmHg. IAS/Shunts: No atrial level shunt detected by color flow Doppler.  LEFT VENTRICLE PLAX 2D LVIDd:         5.07 cm  Diastology LVIDs:         3.77 cm  LV e' lateral:   11.50 cm/s LV PW:         1.03 cm  LV E/e' lateral: 7.9 LV IVS:        1.04 cm  LV e' medial:    7.73 cm/s LVOT diam:     2.30 cm  LV E/e' medial:  11.8 LV SV:         65 LV SV Index:   30 LVOT Area:     4.15 cm  RIGHT VENTRICLE RV S prime:     14.70 cm/s TAPSE (M-mode): 2.4 cm LEFT ATRIUM             Index       RIGHT ATRIUM           Index LA diam:        3.40 cm 1.55 cm/m  RA Area:     19.80 cm LA Vol (A2C):   56.9 ml 25.97 ml/m RA Volume:   58.30 ml  26.61 ml/m LA Vol (A4C):   55.0 ml  25.10 ml/m LA Biplane Vol: 58.1 ml 26.51 ml/m  AORTIC VALVE LVOT Vmax:    97.90 cm/s LVOT Vmean:  62.000 cm/s LVOT VTI:    0.156 m  AORTA Ao Root diam: 3.30 cm MITRAL VALVE MV Area (PHT): 3.72 cm    SHUNTS MV Decel Time: 204 msec    Systemic VTI:  0.16 m MV E velocity: 91.30 cm/s  Systemic Diam: 2.30 cm MV A velocity: 55.70 cm/s MV E/A ratio:  1.64 Orpah Cobb MD Electronically signed by Orpah Cobb MD Signature Date/Time: 11/11/2019/10:51:48 AM    Final    CT VENOGRAM ABD/PEL  Result Date: 11/10/2019 CLINICAL DATA:  44 year old male with bilateral lower extremity swelling and concern for DVT. EXAM: CT ABDOMEN AND PELVIS WITH CONTRAST TECHNIQUE: Multidetector CT imaging of the abdomen and pelvis was performed using the standard protocol following bolus administration of intravenous contrast. CONTRAST:  OMNIPAQUE IOHEXOL 350 MG/ML SOLN COMPARISON:  None. FINDINGS: Lower chest: Minimal bibasilar dependent atelectasis. The visualized lung bases are otherwise clear. No intra-abdominal free air or free fluid. Hepatobiliary: Mild fatty infiltration of the liver. No intrahepatic biliary ductal dilatation. The gallbladder is unremarkable. Pancreas: Unremarkable. No pancreatic ductal dilatation or surrounding inflammatory changes. Spleen: Normal in size without focal abnormality. Adrenals/Urinary Tract: The adrenal glands unremarkable. There is no hydronephrosis on either side. There is symmetric enhancement and excretion of contrast by both kidneys. The visualized ureters and urinary bladder appear unremarkable. Stomach/Bowel: There are small scattered sigmoid and colonic diverticula without active inflammatory changes. Minimal thickened appearance of the sigmoid colon, likely related to underdistention. Mild colitis is less likely. Clinical correlation is recommended. There is no bowel obstruction. The appendix is normal. Vascular/Lymphatic: Mild aortoiliac atherosclerotic disease. The visualized common femoral veins, iliac veins, and the IVC appear unremarkable for the degree of  opacification. No obvious clot identified within the visualized portions of these veins. Evaluation is however somewhat limited due to suboptimal opacification. There is no portal venous gas. Several mildly rounded small retroperitoneal lymph nodes, indeterminate, possibly reactive. Clinical correlation is recommended. Reproductive: The prostate and seminal vesicles are grossly unremarkable. Other: Partially visualized small bilateral hydroceles. Mild diffuse subcutaneous edema. Musculoskeletal: Degenerative changes of the spine with lower lumbar disc desiccation and vacuum phenomena. No acute osseous pathology. IMPRESSION: 1. No acute intra-abdominal or pelvic pathology. No definite DVT identified in the visualized major veins. 2. No bowel obstruction. Normal appendix. 3. Colonic diverticulosis. 4. Mild fatty liver. 5. Aortic Atherosclerosis (ICD10-I70.0). Electronically Signed   By: Elgie Collard M.D.   On: 11/10/2019 15:13   VAS [place] LOWER EXTREMITY VENOUS (DVT) (ONLY [facility] & [facility])  Result Date: 11/11/2019  Lower Venous DVTStudy Indications: Pain, Swelling, and history of DVT.  Limitations: Poor ultrasound/tissue interface. Comparison Study: 09/28/2019- acute DVT left distal femoral vein, left popliteal                   vein, left posterior tibial veins, left peroneal veins. Performing Technologist: Gertie Fey MHA, RDMS, RVT, RDCS  Examination Guidelines: A complete evaluation includes B-mode imaging, spectral Doppler, color Doppler, and power Doppler as needed of all accessible portions of each vessel. Bilateral testing is considered an integral part of a complete examination. Limited examinations for reoccurring indications may be performed as noted. The reflux portion of the exam is performed with the patient in reverse Trendelenburg.  +---------+---------------+---------+-----------+----------+--------------+ RIGHT    CompressibilityPhasicitySpontaneityPropertiesThrombus Aging  +---------+---------------+---------+-----------+----------+--------------+ CFV      Full  No       Yes                                 +---------+---------------+---------+-----------+----------+--------------+ SFJ      Full                                                        +---------+---------------+---------+-----------+----------+--------------+ FV Prox  Full                                                        +---------+---------------+---------+-----------+----------+--------------+ FV Mid   Full                                                        +---------+---------------+---------+-----------+----------+--------------+ FV DistalFull                                                        +---------+---------------+---------+-----------+----------+--------------+ PFV      Full                                                        +---------+---------------+---------+-----------+----------+--------------+ POP      Full           No       Yes                                 +---------+---------------+---------+-----------+----------+--------------+ PTV      Full                                                        +---------+---------------+---------+-----------+----------+--------------+ PERO     Full                                                        +---------+---------------+---------+-----------+----------+--------------+ CIV                              No                                  +---------+---------------+---------+-----------+----------+--------------+   +---------+---------------+---------+-----------+----------+--------------+ LEFT  CompressibilityPhasicitySpontaneityPropertiesThrombus Aging +---------+---------------+---------+-----------+----------+--------------+ CFV      Full           No       Yes                                  +---------+---------------+---------+-----------+----------+--------------+ SFJ      Full                                                        +---------+---------------+---------+-----------+----------+--------------+ FV Prox  Full                                                        +---------+---------------+---------+-----------+----------+--------------+ FV Mid   Full                                                        +---------+---------------+---------+-----------+----------+--------------+ FV DistalFull                                                        +---------+---------------+---------+-----------+----------+--------------+ PFV      Full                                                        +---------+---------------+---------+-----------+----------+--------------+ POP      None                    No                   Acute          +---------+---------------+---------+-----------+----------+--------------+ PTV                              No                   Acute          +---------+---------------+---------+-----------+----------+--------------+ PERO                             No                   Acute          +---------+---------------+---------+-----------+----------+--------------+ CIV                              Yes                                 +---------+---------------+---------+-----------+----------+--------------+  Left Technical Findings: Not visualized segments include inadequate visualization of PTV and peroneal veins in transverse to perform adequate compression maneuvers.  Unable to visualize IVC due to overlying bowel gas.  Summary: RIGHT: - There is no evidence of deep vein thrombosis in the lower extremity. However, portions of this examination were limited- see technologist comments above.  LEFT: - Findings consistent with acute deep vein thrombosis involving the left popliteal vein, left posterior  tibial veins, and left peroneal veins. When compared to the prior study, there is no significant change. - No cystic structure found in the popliteal fossa.  Pulsatile lower extremity venous flow is suggestive of possibly elevated right heart pressure.  *See table(s) above for measurements and observations. Electronically signed by Coral Else MD on 11/11/2019 at 8:56:11 AM.    Final         Scheduled Meds: . apixaban  5 mg Oral BID  . furosemide  80 mg Intravenous Q12H  . metoprolol tartrate  25 mg Oral BID  . nicotine  21 mg Transdermal Daily  . potassium chloride SA  20 mEq Oral BID   Continuous Infusions:   LOS: 1 day     Alwyn Ren, MD 11/11/2019, 12:28 PM

## 2019-11-11 NOTE — Consult Note (Signed)
Referring Physician: Dr. Mathews/Dr. Jasmine Awe Jesus Fuller is an 44 y.o. male.                       Chief Complaint: Shortness of breath  HPI: 44 year old male with PMH of non-ischemic cardiomyopathy has noticed increasing leg edema and shortness of breath along with DVT of left leg and use of Eliquis for over 1 month. He has hypertension and Asthma. He admits to non-compliance on diet and fluid intake. He drinks close to 6 pack of beer per day and other liquids. Echocardiogram shows mild LV systolic dysfunction, mostly in basal segments with moderately dilated MR and TR. His cardiac cath was normal 1 year ago. CT chest is negative for PE.  Past Medical History:  Diagnosis Date  . Asthma   . Hypertension    Takes no medicine      Past Surgical History:  Procedure Laterality Date  . RIGHT/LEFT HEART CATH AND CORONARY ANGIOGRAPHY N/A 10/24/2018   Procedure: RIGHT/LEFT HEART CATH AND CORONARY ANGIOGRAPHY;  Surgeon: Orpah Cobb, MD;  Location: [facility];  Service: Cardiovascular;  Laterality: N/A;    Family History  Problem Relation Age of Onset  . Cancer Mother   . COPD Mother   . Diabetes Father    Social History:  reports that he has been smoking cigarettes. He has been smoking about 1.50 packs per day. He has never used smokeless tobacco. He reports current alcohol use. He reports that he does not use drugs.  Allergies: No Known Allergies  Medications Prior to Admission  Medication Sig Dispense Refill  . acetaminophen (TYLENOL) 500 MG tablet Take 500-1,000 mg by mouth every 6 (six) hours as needed for mild pain.    Marland Kitchen apixaban (ELIQUIS) 5 MG TABS tablet Take 1 tablet (5 mg total) by mouth 2 (two) times daily. 60 tablet 3  . metoprolol tartrate (LOPRESSOR) 25 MG tablet Take 1 tablet (25 mg total) by mouth 2 (two) times daily. (Patient taking differently: Take 25 mg by mouth daily. ) 60 tablet 6  . potassium chloride SA (KLOR-CON) 20 MEQ tablet Take 1 tablet (20 mEq  total) by mouth 2 (two) times daily. 4 tablet 0    Results for orders placed or performed during the hospital encounter of 11/10/19 (from the past 48 hour(s))  Comprehensive metabolic panel     Status: Abnormal   Collection Time: 11/10/19 12:37 PM  Result Value Ref Range   Sodium 142 135 - 145 mmol/L   Potassium 3.3 (L) 3.5 - 5.1 mmol/L   Chloride 101 98 - 111 mmol/L   CO2 30 22 - 32 mmol/L   Glucose, Bld 105 (H) 70 - 99 mg/dL    Comment: Glucose reference range applies only to samples taken after fasting for at least 8 hours.   BUN 6 6 - 20 mg/dL   Creatinine, Ser 1.61 0.61 - 1.24 mg/dL   Calcium 9.3 8.9 - 09.6 mg/dL   Total Protein 6.8 6.5 - 8.1 g/dL   Albumin 3.9 3.5 - 5.0 g/dL   AST 48 (H) 15 - 41 U/L   ALT 45 (H) 0 - 44 U/L   Alkaline Phosphatase 67 38 - 126 U/L   Total Bilirubin 1.0 0.3 - 1.2 mg/dL   GFR calc non Af Amer >60 >60 mL/min   GFR calc Af Amer >60 >60 mL/min   Anion gap 11 5 - 15    Comment: Performed at  [facility], [address] [place], [place] [address]  Troponin I (High Sensitivity)     Status: Abnormal   Collection Time: 11/10/19 12:37 PM  Result Value Ref Range   Troponin I (High Sensitivity) 35 (H) <18 ng/L    Comment: (NOTE) Elevated high sensitivity troponin I (hsTnI) values and significant  changes across serial measurements may suggest ACS but many other  chronic and acute conditions are known to elevate hsTnI results.  Refer to the "Links" section for chest pain algorithms and additional  guidance. Performed at [facility], [address], [place], [place] [address]   Brain natriuretic peptide     Status: Abnormal   Collection Time: 11/10/19 12:37 PM  Result Value Ref Range   B Natriuretic Peptide 418.4 (H) 0.0 - 100.0 pg/mL    Comment: Performed at [facility], [address] [place], [place] [address]  CBC with Differential     Status: Abnormal   Collection Time: 11/10/19 12:37  PM  Result Value Ref Range   WBC 7.1 4.0 - 10.5 K/uL   RBC 3.65 (L) 4.22 - 5.81 MIL/uL   Hemoglobin 14.9 13.0 - 17.0 g/dL   HCT 85.9 29.2 - 44.6 %   MCV 115.3 (H) 80.0 - 100.0 fL   MCH 40.8 (H) 26.0 - 34.0 pg   MCHC 35.4 30.0 - 36.0 g/dL   RDW 28.6 38.1 - 77.1 %   Platelets 225 150 - 400 K/uL   nRBC 0.0 0.0 - 0.2 %   Neutrophils Relative % 73 %   Neutro Abs 5.2 1.7 - 7.7 K/uL   Lymphocytes Relative 17 %   Lymphs Abs 1.2 0.7 - 4.0 K/uL   Monocytes Relative 8 %   Monocytes Absolute 0.6 0.1 - 1.0 K/uL   Eosinophils Relative 1 %   Eosinophils Absolute 0.1 0.0 - 0.5 K/uL   Basophils Relative 1 %   Basophils Absolute 0.1 0.0 - 0.1 K/uL   Immature Granulocytes 0 %   Abs Immature Granulocytes 0.02 0.00 - 0.07 K/uL    Comment: Performed at [facility], [address] [place], [place] [address]  Urinalysis, Routine w reflex microscopic     Status: None   Collection Time: 11/10/19  2:38 PM  Result Value Ref Range   Color, Urine YELLOW YELLOW   APPearance CLEAR CLEAR   Specific Gravity, Urine 1.015 1.005 - 1.030   pH 7.0 5.0 - 8.0   Glucose, UA NEGATIVE NEGATIVE mg/dL   Hgb urine dipstick NEGATIVE NEGATIVE   Bilirubin Urine NEGATIVE NEGATIVE   Ketones, ur NEGATIVE NEGATIVE mg/dL   Protein, ur NEGATIVE NEGATIVE mg/dL   Nitrite NEGATIVE NEGATIVE   Leukocytes,Ua NEGATIVE NEGATIVE    Comment: Performed at [facility], [address], [place], [place] [address]  Troponin I (High Sensitivity)     Status: Abnormal   Collection Time: 11/10/19  2:38 PM  Result Value Ref Range   Troponin I (High Sensitivity) 37 (H) <18 ng/L    Comment: (NOTE) Elevated high sensitivity troponin I (hsTnI) values and significant  changes across serial measurements may suggest ACS but many other  chronic and acute conditions are known to elevate hsTnI results.  Refer to the "Links" section for chest pain algorithms and additional  guidance. Performed at [facility], [address], [place], [place] [address]   SARS CORONAVIRUS 2 (TAT 6-24 HRS) Nasopharyngeal     Status: None  Collection Time: 11/10/19  2:38 PM   Specimen: Nasopharyngeal  Result Value Ref Range   SARS Coronavirus 2 NEGATIVE NEGATIVE    Comment: (NOTE) SARS-CoV-2 target nucleic acids are NOT DETECTED. The SARS-CoV-2 RNA is generally detectable in upper and lower respiratory specimens during the acute phase of infection. Negative results do not preclude SARS-CoV-2 infection, do not rule out co-infections with other pathogens, and should not be used as the sole basis for treatment or other patient management decisions. Negative results must be combined with clinical observations, patient history, and epidemiological information. The expected result is Negative. Fact Sheet for Patients: HairSlick.no Fact Sheet for Healthcare Providers: quierodirigir.com This test is not yet approved or cleared by the [place] FDA and  has been authorized for detection and/or diagnosis of SARS-CoV-2 by FDA under an Emergency Use Authorization (EUA). This EUA will remain  in effect (meaning this test can be used) for the duration of the COVID-19 declaration under Section 56 4(b)(1) of the Act, 21 U.S.C. section 360bbb-3(b)(1), unless the authorization is terminated or revoked sooner. Performed at [facility], [address], [place], [place] [address]   Comprehensive metabolic panel     Status: Abnormal   Collection Time: 11/11/19  9:24 AM  Result Value Ref Range   Sodium 140 135 - 145 mmol/L   Potassium 3.3 (L) 3.5 - 5.1 mmol/L   Chloride 95 (L) 98 - 111 mmol/L   CO2 35 (H) 22 - 32 mmol/L   Glucose, Bld 113 (H) 70 - 99 mg/dL    Comment: Glucose reference range applies only to samples taken after fasting for at least 8 hours.   BUN 10 6 - 20 mg/dL   Creatinine, Ser 6.04 0.61 - 1.24 mg/dL   Calcium 9.7 8.9 -  54.0 mg/dL   Total Protein 7.1 6.5 - 8.1 g/dL   Albumin 4.2 3.5 - 5.0 g/dL   AST 42 (H) 15 - 41 U/L   ALT 39 0 - 44 U/L   Alkaline Phosphatase 70 38 - 126 U/L   Total Bilirubin 2.1 (H) 0.3 - 1.2 mg/dL   GFR calc non Af Amer >60 >60 mL/min   GFR calc Af Amer >60 >60 mL/min   Anion gap 10 5 - 15    Comment: Performed at [facility], [address], [place], [place] [address]  CBC     Status: Abnormal   Collection Time: 11/11/19  9:24 AM  Result Value Ref Range   WBC 6.3 4.0 - 10.5 K/uL   RBC 3.78 (L) 4.22 - 5.81 MIL/uL   Hemoglobin 15.2 13.0 - 17.0 g/dL   HCT 14.7 82.9 - 56.2 %   MCV 116.9 (H) 80.0 - 100.0 fL   MCH 40.2 (H) 26.0 - 34.0 pg   MCHC 34.4 30.0 - 36.0 g/dL   RDW 13.0 86.5 - 78.4 %   Platelets 216 150 - 400 K/uL   nRBC 0.0 0.0 - 0.2 %    Comment: Performed at [facility], [address], [place], [place] [address]  Magnesium     Status: None   Collection Time: 11/11/19  9:24 AM  Result Value Ref Range   Magnesium 1.7 1.7 - 2.4 mg/dL    Comment: Performed at [facility], [address] [place], [place] [address]  Vitamin B12     Status: Abnormal   Collection Time: 11/11/19  9:24 AM  Result Value Ref Range   Vitamin B-12  144 (L) 180 - 914 pg/mL    Comment: (NOTE) This assay is not validated for testing neonatal or myeloproliferative syndrome specimens for Vitamin B12 levels. Performed at [facility], [address], [place], [place] [address]   Folate     Status: None   Collection Time: 11/11/19  9:24 AM  Result Value Ref Range   Folate 7.1 >5.9 ng/mL    Comment: Performed at [facility], [address] [place], [place] [address]  TSH     Status: None   Collection Time: 11/11/19  9:24 AM  Result Value Ref Range   TSH 2.775 0.350 - 4.500 uIU/mL    Comment: Performed by a 3rd Generation assay with a functional sensitivity of <=0.01 uIU/mL. Performed at [facility], [address], [place], [place] [address]    CT Angio Chest PE W and/or Wo Contrast  Result Date: 11/10/2019 CLINICAL DATA:  Short of breath, tachycardia EXAM: CT ANGIOGRAPHY CHEST WITH CONTRAST TECHNIQUE: Multidetector CT imaging of the chest was performed using the standard protocol during bolus administration of intravenous contrast. Multiplanar CT image reconstructions and MIPs were obtained to evaluate the vascular anatomy. CONTRAST:  OMNIPAQUE IOHEXOL 350 MG/ML SOLN COMPARISON:  11/10/2019 FINDINGS: Cardiovascular: This is a technically adequate evaluation of the pulmonary vasculature. No filling defects or pulmonary emboli. Heart is unremarkable without pericardial effusion. Normal caliber of the thoracic aorta. Mediastinum/Nodes: Numerous borderline enlarged hilar and mediastinal lymph nodes, nonspecific. Largest at the AP window measures 13 mm in short axis. Trachea, esophagus, and thyroid are unremarkable. Lungs/Pleura: No acute airspace disease, effusion, or pneumothorax. Central airways are widely patent. Upper Abdomen: No acute abnormality. Musculoskeletal: No acute or destructive bony lesions. Reconstructed images demonstrate no additional findings. Review of the MIP images confirms the above findings. IMPRESSION: 1. No CT evidence of pulmonary embolism. 2. No acute airspace disease. 3. Numerous borderline enlarged mediastinal and hilar lymph nodes, nonspecific. Electronically Signed   By: Sharlet Salina M.D.   On: 11/10/2019 15:05   DG Chest Portable 1 View  Result Date: 11/10/2019 CLINICAL DATA:  Tachycardia EXAM: PORTABLE CHEST 1 VIEW COMPARISON:  10/23/2018 FINDINGS: Cardiomegaly. Both lungs are clear. The visualized skeletal structures are unremarkable. IMPRESSION: Cardiomegaly without acute abnormality of the lungs in AP portable projection. Electronically Signed   By: Lauralyn Primes M.D.   On: 11/10/2019 12:48   ECHOCARDIOGRAM COMPLETE  Result Date:  11/11/2019    ECHOCARDIOGRAM REPORT   Patient Name:   Jesus Fuller Date of Exam: 11/11/2019 Medical Rec #:  [id number]      Height:       69.0 in Accession #:    [id number]     Weight:       229.7 lb Date of Birth:  04-Apr-1976      BSA:          2.191 m Patient Age:    43 years       BP:           160/93 mmHg Patient Gender: M              HR:           77 bpm. Exam Location:  Inpatient Procedure: 2D Echo and Intracardiac Opacification Agent Indications:     CHF-Acute Diastolic 428.31 / I50.31  History:         Patient has prior history of Echocardiogram examinations, most  recent 10/25/2018. Risk Factors:Hypertension and Current Smoker.                  Nonischemic cardiomyopathy, ETOH Abuse, Asthma.  Sonographer:     Leeroy Bock Turrentine Referring Phys:  [id number] Glade Lloyd Diagnosing Phys: Orpah Cobb MD IMPRESSIONS  1. Left ventricular ejection fraction, by estimation, is 45 to 50%. The left ventricle has mildly decreased function. The left ventricle demonstrates regional wall motion abnormalities (see scoring diagram/findings for description). Left ventricular diastolic parameters are consistent with Grade II diastolic dysfunction (pseudonormalization). There is mild hypokinesis of the left ventricular, basal anterior wall, septal wall and inferior wall.  2. Right ventricular systolic function is mildly reduced. The right ventricular size is mildly enlarged.  3. Left atrial size was moderately dilated.  4. Right atrial size was moderately dilated.  5. The mitral valve is normal in structure. Trivial mitral valve regurgitation.  6. The aortic valve is normal in structure. Aortic valve regurgitation is not visualized.  7. The inferior vena cava is normal in size with greater than 50% respiratory variability, suggesting right atrial pressure of 3 mmHg. FINDINGS  Left Ventricle: Left ventricular ejection fraction, by estimation, is 45 to 50%. The left ventricle has mildly decreased function. The left  ventricle demonstrates regional wall motion abnormalities. Mild hypokinesis of the left ventricular, basal anterior wall, septal wall and inferior wall. Definity contrast agent was given IV to delineate the left ventricular endocardial borders. The left ventricular internal cavity size was normal in size. There is no left ventricular hypertrophy. Left ventricular diastolic parameters are consistent with Grade II diastolic dysfunction (pseudonormalization).  LV Wall Scoring: The basal anteroseptal segment, basal inferolateral segment, basal anterolateral segment, basal anterior segment, basal inferior segment, and basal inferoseptal segment are hypokinetic. The mid and distal anterior wall, mid and distal lateral wall, mid and distal anterior septum, entire apex, mid and distal inferior wall, mid anterolateral segment, and mid inferoseptal segment are normal. Right Ventricle: The right ventricular size is mildly enlarged. No increase in right ventricular wall thickness. Right ventricular systolic function is mildly reduced. Left Atrium: Left atrial size was moderately dilated. Right Atrium: Right atrial size was moderately dilated. Pericardium: There is no evidence of pericardial effusion. Mitral Valve: The mitral valve is normal in structure. Trivial mitral valve regurgitation. Tricuspid Valve: The tricuspid valve is normal in structure. Tricuspid valve regurgitation is trivial. Aortic Valve: The aortic valve is normal in structure. Aortic valve regurgitation is not visualized. Pulmonic Valve: The pulmonic valve was grossly normal. Pulmonic valve regurgitation is not visualized. Aorta: The aortic root is normal in size and structure. Venous: The inferior vena cava is normal in size with greater than 50% respiratory variability, suggesting right atrial pressure of 3 mmHg. IAS/Shunts: No atrial level shunt detected by color flow Doppler.  LEFT VENTRICLE PLAX 2D LVIDd:         5.07 cm  Diastology LVIDs:         3.77 cm   LV e' lateral:   11.50 cm/s LV PW:         1.03 cm  LV E/e' lateral: 7.9 LV IVS:        1.04 cm  LV e' medial:    7.73 cm/s LVOT diam:     2.30 cm  LV E/e' medial:  11.8 LV SV:         65 LV SV Index:   30 LVOT Area:     4.15 cm  RIGHT VENTRICLE RV S prime:  14.70 cm/s TAPSE (M-mode): 2.4 cm LEFT ATRIUM             Index       RIGHT ATRIUM           Index LA diam:        3.40 cm 1.55 cm/m  RA Area:     19.80 cm LA Vol (A2C):   56.9 ml 25.97 ml/m RA Volume:   58.30 ml  26.61 ml/m LA Vol (A4C):   55.0 ml 25.10 ml/m LA Biplane Vol: 58.1 ml 26.51 ml/m  AORTIC VALVE LVOT Vmax:   97.90 cm/s LVOT Vmean:  62.000 cm/s LVOT VTI:    0.156 m  AORTA Ao Root diam: 3.30 cm MITRAL VALVE MV Area (PHT): 3.72 cm    SHUNTS MV Decel Time: 204 msec    Systemic VTI:  0.16 m MV E velocity: 91.30 cm/s  Systemic Diam: 2.30 cm MV A velocity: 55.70 cm/s MV E/A ratio:  1.64 Dixie Dials MD Electronically signed by Dixie Dials MD Signature Date/Time: 11/11/2019/10:51:48 AM    Final    CT VENOGRAM ABD/PEL  Result Date: 11/10/2019 CLINICAL DATA:  44 year old male with bilateral lower extremity swelling and concern for DVT. EXAM: CT ABDOMEN AND PELVIS WITH CONTRAST TECHNIQUE: Multidetector CT imaging of the abdomen and pelvis was performed using the standard protocol following bolus administration of intravenous contrast. CONTRAST:  141mL OMNIPAQUE IOHEXOL 350 MG/ML SOLN COMPARISON:  None. FINDINGS: Lower chest: Minimal bibasilar dependent atelectasis. The visualized lung bases are otherwise clear. No intra-abdominal free air or free fluid. Hepatobiliary: Mild fatty infiltration of the liver. No intrahepatic biliary ductal dilatation. The gallbladder is unremarkable. Pancreas: Unremarkable. No pancreatic ductal dilatation or surrounding inflammatory changes. Spleen: Normal in size without focal abnormality. Adrenals/Urinary Tract: The adrenal glands unremarkable. There is no hydronephrosis on either side. There is symmetric  enhancement and excretion of contrast by both kidneys. The visualized ureters and urinary bladder appear unremarkable. Stomach/Bowel: There are small scattered sigmoid and colonic diverticula without active inflammatory changes. Minimal thickened appearance of the sigmoid colon, likely related to underdistention. Mild colitis is less likely. Clinical correlation is recommended. There is no bowel obstruction. The appendix is normal. Vascular/Lymphatic: Mild aortoiliac atherosclerotic disease. The visualized common femoral veins, iliac veins, and the IVC appear unremarkable for the degree of opacification. No obvious clot identified within the visualized portions of these veins. Evaluation is however somewhat limited due to suboptimal opacification. There is no portal venous gas. Several mildly rounded small retroperitoneal lymph nodes, indeterminate, possibly reactive. Clinical correlation is recommended. Reproductive: The prostate and seminal vesicles are grossly unremarkable. Other: Partially visualized small bilateral hydroceles. Mild diffuse subcutaneous edema. Musculoskeletal: Degenerative changes of the spine with lower lumbar disc desiccation and vacuum phenomena. No acute osseous pathology. IMPRESSION: 1. No acute intra-abdominal or pelvic pathology. No definite DVT identified in the visualized major veins. 2. No bowel obstruction. Normal appendix. 3. Colonic diverticulosis. 4. Mild fatty liver. 5. Aortic Atherosclerosis (ICD10-I70.0). Electronically Signed   By: Anner Crete M.D.   On: 11/10/2019 15:13   VAS [place] LOWER EXTREMITY VENOUS (DVT) (ONLY [facility])  Result Date: 11/11/2019  Lower Venous DVTStudy Indications: Pain, Swelling, and history of DVT.  Limitations: Poor ultrasound/tissue interface. Comparison Study: 09/28/2019- acute DVT left distal femoral vein, left popliteal                   vein, left posterior tibial veins, left peroneal veins. Performing Technologist: Maudry Mayhew MHA,  RDMS,  RVT, RDCS  Examination Guidelines: A complete evaluation includes B-mode imaging, spectral Doppler, color Doppler, and power Doppler as needed of all accessible portions of each vessel. Bilateral testing is considered an integral part of a complete examination. Limited examinations for reoccurring indications may be performed as noted. The reflux portion of the exam is performed with the patient in reverse Trendelenburg.  +---------+---------------+---------+-----------+----------+--------------+ RIGHT    CompressibilityPhasicitySpontaneityPropertiesThrombus Aging +---------+---------------+---------+-----------+----------+--------------+ CFV      Full           No       Yes                                 +---------+---------------+---------+-----------+----------+--------------+ SFJ      Full                                                        +---------+---------------+---------+-----------+----------+--------------+ FV Prox  Full                                                        +---------+---------------+---------+-----------+----------+--------------+ FV Mid   Full                                                        +---------+---------------+---------+-----------+----------+--------------+ FV DistalFull                                                        +---------+---------------+---------+-----------+----------+--------------+ PFV      Full                                                        +---------+---------------+---------+-----------+----------+--------------+ POP      Full           No       Yes                                 +---------+---------------+---------+-----------+----------+--------------+ PTV      Full                                                        +---------+---------------+---------+-----------+----------+--------------+ PERO     Full                                                         +---------+---------------+---------+-----------+----------+--------------+  CIV                              No                                  +---------+---------------+---------+-----------+----------+--------------+   +---------+---------------+---------+-----------+----------+--------------+ LEFT     CompressibilityPhasicitySpontaneityPropertiesThrombus Aging +---------+---------------+---------+-----------+----------+--------------+ CFV      Full           No       Yes                                 +---------+---------------+---------+-----------+----------+--------------+ SFJ      Full                                                        +---------+---------------+---------+-----------+----------+--------------+ FV Prox  Full                                                        +---------+---------------+---------+-----------+----------+--------------+ FV Mid   Full                                                        +---------+---------------+---------+-----------+----------+--------------+ FV DistalFull                                                        +---------+---------------+---------+-----------+----------+--------------+ PFV      Full                                                        +---------+---------------+---------+-----------+----------+--------------+ POP      None                    No                   Acute          +---------+---------------+---------+-----------+----------+--------------+ PTV                              No                   Acute          +---------+---------------+---------+-----------+----------+--------------+ PERO                             No  Acute          +---------+---------------+---------+-----------+----------+--------------+ CIV                              Yes                                  +---------+---------------+---------+-----------+----------+--------------+   Left Technical Findings: Not visualized segments include inadequate visualization of PTV and peroneal veins in transverse to perform adequate compression maneuvers.  Unable to visualize IVC due to overlying bowel gas.  Summary: RIGHT: - There is no evidence of deep vein thrombosis in the lower extremity. However, portions of this examination were limited- see technologist comments above.  LEFT: - Findings consistent with acute deep vein thrombosis involving the left popliteal vein, left posterior tibial veins, and left peroneal veins. When compared to the prior study, there is no significant change. - No cystic structure found in the popliteal fossa.  Pulsatile lower extremity venous flow is suggestive of possibly elevated right heart pressure.  *See table(s) above for measurements and observations. Electronically signed by Coral Else MD on 11/11/2019 at 8:56:11 AM.    Final     Review Of Systems Constitutional: No fever, chills, Positive weight gain. Eyes: No vision change, wears glasses. No discharge or pain. Ears: No hearing loss, No tinnitus. Respiratory: No asthma, COPD, pneumonias. Positive shortness of breath. No hemoptysis. Cardiovascular: Positive chest pain, palpitation, leg edema. Gastrointestinal: No nausea, vomiting, diarrhea, constipation. No GI bleed. No hepatitis. Genitourinary: No dysuria, hematuria, kidney stone. No incontinance. Neurological: No headache, stroke, seizures.  Psychiatry: No psych facility admission for anxiety, depression, suicide. No detox. Skin: No rash. Musculoskeletal: No joint pain, fibromyalgia. No neck pain, back pain. Lymphadenopathy: No lymphadenopathy. Hematology: No anemia or easy bruising.   Blood pressure (!) 160/93, pulse 77, temperature 97.8 F (36.6 C), temperature source Oral, resp. rate (!) 22, height  (1.753 m), weight 104.2 kg, SpO2 94 %. Body mass index is  33.92 kg/m. General appearance: alert, cooperative, appears stated age and no distress Head: Normocephalic, atraumatic. Eyes: Brown eyes, pink conjunctiva, corneas clear. PERRL, EOM's intact. Neck: No adenopathy, no carotid bruit, no JVD, supple, symmetrical, trachea midline and thyroid not enlarged. Resp: Clear to auscultation bilaterally. Cardio: Regular rate and rhythm, S1, S2 normal, II/VI systolic murmur, no click, rub or gallop GI: Soft, non-tender; bowel sounds normal; no organomegaly. Extremities: 2 + edema, no cyanosis or clubbing. Skin: Warm and dry.  Neurologic: Alert and oriented X 3, normal strength. Normal coordination.  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 Continue apixaban, potassium, Metoprolol. Add Losartan possible conversion to Entresto if needed.  Abstain from alcohol to reduce fluid load. Smoking cessation. Potassium replacement.  Time spent: Review of old records, Lab, x-rays, EKG, other cardiac tests, examination, discussion with patient and referring doctor over 70 minutes.  Ricki Rodriguez, MD  11/11/2019, 2:00 PM

## 2019-11-12 LAB — BASIC METABOLIC PANEL
Anion gap: 11 (ref 5–15)
BUN: 13 mg/dL (ref 6–20)
CO2: 33 mmol/L — ABNORMAL HIGH (ref 22–32)
Calcium: 9.6 mg/dL (ref 8.9–10.3)
Chloride: 95 mmol/L — ABNORMAL LOW (ref 98–111)
Creatinine, Ser: 0.9 mg/dL (ref 0.61–1.24)
GFR calc Af Amer: >60 mL/min (ref >60)
GFR calc non Af Amer: >60 mL/min (ref >60)
Glucose, Bld: 106 mg/dL — ABNORMAL HIGH (ref 70–99)
Potassium: 3.4 mmol/L — ABNORMAL LOW (ref 3.5–5.1)
Sodium: 139 mmol/L (ref 135–145)

## 2019-11-12 LAB — CBC
HCT: 45.1 % (ref 39.0–52.0)
Hemoglobin: 15.7 g/dL (ref 13.0–17.0)
MCH: 40.8 pg — ABNORMAL HIGH (ref 26.0–34.0)
MCHC: 34.8 g/dL (ref 30.0–36.0)
MCV: 117.1 fL — ABNORMAL HIGH (ref 80.0–100.0)
Platelets: 204 10*3/uL (ref 150–400)
RBC: 3.85 MIL/uL — ABNORMAL LOW (ref 4.22–5.81)
RDW: 13.3 % (ref 11.5–15.5)
WBC: 7.3 10*3/uL (ref 4.0–10.5)
nRBC: 0 % (ref 0.0–0.2)

## 2019-11-12 MED ORDER — NICOTINE 21 MG/24HR TD PT24: 21.0000 mg | MEDICATED_PATCH | Freq: Every day | TRANSDERMAL | 0 refills | Status: DC

## 2019-11-12 MED ORDER — MAGNESIUM SULFATE 2 GM/50ML IV SOLN
2.0000 g | Freq: Once | INTRAVENOUS | Status: AC
  Administered 2019-11-12: 2 g via INTRAVENOUS
  Filled 2019-11-12: qty 50

## 2019-11-12 MED ORDER — POTASSIUM CHLORIDE CRYS ER 20 MEQ PO TBCR
40.0000 meq | EXTENDED_RELEASE_TABLET | Freq: Once | ORAL | Status: AC
  Administered 2019-11-12: 13:00:00 40 meq via ORAL
  Filled 2019-11-12: qty 2

## 2019-11-12 MED ORDER — LOSARTAN POTASSIUM 50 MG PO TABS: 100.0000 mg | ORAL_TABLET | Freq: Every day | ORAL | 3 refills | Status: DC

## 2019-11-12 MED ORDER — FUROSEMIDE 40 MG PO TABS
40.0000 mg | ORAL_TABLET | Freq: Two times a day (BID) | ORAL | 3 refills | Status: DC
Start: 2019-11-12 — End: 2020-02-21

## 2019-11-12 MED ORDER — AMLODIPINE BESYLATE 10 MG PO TABS: 10.0000 mg | ORAL_TABLET | Freq: Every day | ORAL | 3 refills | Status: DC

## 2019-11-12 MED ORDER — AMLODIPINE BESYLATE 10 MG PO TABS
10.0000 mg | ORAL_TABLET | Freq: Every day | ORAL | Status: DC
  Administered 2019-11-12: 13:00:00 5 mg via ORAL
  Filled 2019-11-12: qty 1

## 2019-11-12 MED ORDER — POTASSIUM CHLORIDE ER 10 MEQ PO TBCR: 10.0000 meq | EXTENDED_RELEASE_TABLET | Freq: Two times a day (BID) | ORAL | 3 refills | Status: DC

## 2019-11-12 MED ORDER — METOPROLOL TARTRATE 25 MG PO TABS: 25.0000 mg | ORAL_TABLET | Freq: Two times a day (BID) | ORAL | 3 refills | Status: DC

## 2019-11-12 MED ORDER — AMLODIPINE BESYLATE 5 MG PO TABS
5.0000 mg | ORAL_TABLET | ORAL | Status: AC
  Administered 2019-11-12: 11:00:00 5 mg via ORAL
  Filled 2019-11-12: qty 1

## 2019-11-12 MED FILL — LOSARTAN POTASSIUM 50 MG TA: 50 | 30 days supply | Qty: 60 | Fill #0

## 2019-11-12 MED FILL — POTASSIUM CHLORIDE ER 10 ME: 10 | 30 days supply | Qty: 60 | Fill #0

## 2019-11-12 MED FILL — AMLODIPINE BESYLATE 10 MG T: 10 | 30 days supply | Qty: 30 | Fill #0

## 2019-11-12 MED FILL — NICOTINE 21 MG/24HR PATCH: 21 | 28 days supply | Qty: 28 | Fill #0

## 2019-11-12 MED FILL — FUROSEMIDE 40 MG TAB: 40 | 15 days supply | Qty: 30 | Fill #0

## 2019-11-12 MED FILL — METOPROLOL TARTRATE 25 MG T: 25 | 30 days supply | Qty: 60 | Fill #0

## 2019-11-12 NOTE — TOC Transition Note (Addendum)
Transition of Care [facility]) - CM/SW Discharge Note   Patient Details  Name: Jesus Fuller MRN: [id number] Date of Birth: Oct 22, 1975  [facility] CM/SW Contact:  Darleene Cleaver, LCSW Phone Number: 11/12/2019, 11:54 AM   Clinical Narrative:     CSW received consult that patient does not have a PCP, patient does have a PCP and is active with her.  Patient also gets his medication from [facility], CSW can not provide any other assistance in regards to PCP and getting medications, CSW signing off.  Final next level of care: Home/Self Care Barriers to Discharge: No Barriers Identified   Patient Goals and CMS Choice Patient states their goals for this hospitalization and ongoing recovery are:: To return back home CMS Medicare.gov Compare Post Acute Care list provided to:: Patient Choice offered to / list presented to : Patient  Discharge Placement                       Discharge Plan and Services                  DME Agency: NA       HH Arranged: NA          Social Determinants of Health (SDOH) Interventions     Readmission Risk Interventions No flowsheet data found.

## 2019-11-12 NOTE — Consult Note (Signed)
Ref: Arvilla Market, DO   Subjective:  Significant diuresis. Decreasing leg edema. VS stable. Mild respiratory distress at rest continues.  Objective:  Vital Signs in the last 24 hours: Temp:  [98.3 F (36.8 C)-98.8 F (37.1 C)] 98.3 F (36.8 C) (04/27 0500) Pulse Rate:  [69-79] 70 (04/27 0604) Cardiac Rhythm: Normal sinus rhythm (04/27 0728) Resp:  [18-21] 18 (04/27 0500) BP: (142-162)/(90-117) 154/99 (04/27 0604) SpO2:  [93 %-96 %] 93 % (04/27 0500) Weight:  [105.9 kg] 105.9 kg (04/27 0516)  Physical Exam: BP Readings from Last 1 Encounters:  11/12/19 (!) 154/99     Wt Readings from Last 1 Encounters:  11/12/19 105.9 kg    Weight change: -4 kg Body mass index is 34.48 kg/m. HEENT: [place]/AT, Eyes-Brown, PERL, EOMI, Conjunctiva-Pink, Sclera-Non-icteric Neck: No JVD, No bruit, Trachea midline. Lungs:  Clear, Bilateral. Cardiac:  Regular rhythm, normal S1 and S2, no S3. II/VI systolic murmur. Abdomen:  Soft, non-tender. BS present. Extremities:  1 + edema present. No cyanosis. No clubbing. CNS: AxOx3, Cranial nerves grossly intact, moves all 4 extremities.  Skin: Warm and dry.   Intake/Output from previous day: 04/26 0701 - 04/27 0700 In: 984.2 [P.O.:960; IV Piggyback:24.2] Out: -     Lab Results: BMET    Component Value Date/Time   NA 139 11/12/2019 0706   NA 140 11/11/2019 0924   NA 142 11/10/2019 1237   NA 143 10/15/2019 1510   K 3.4 (L) 11/12/2019 0706   K 3.3 (L) 11/11/2019 0924   K 3.3 (L) 11/10/2019 1237   CL 95 (L) 11/12/2019 0706   CL 95 (L) 11/11/2019 0924   CL 101 11/10/2019 1237   CO2 33 (H) 11/12/2019 0706   CO2 35 (H) 11/11/2019 0924   CO2 30 11/10/2019 1237   GLUCOSE 106 (H) 11/12/2019 0706   GLUCOSE 113 (H) 11/11/2019 0924   GLUCOSE 105 (H) 11/10/2019 1237   BUN 13 11/12/2019 0706   BUN 10 11/11/2019 0924   BUN 6 11/10/2019 1237   BUN 8 10/15/2019 1510   CREATININE 0.90 11/12/2019 0706   CREATININE 1.00 11/11/2019 0924   CREATININE 0.81 11/10/2019 1237   CALCIUM 9.6 11/12/2019 0706   CALCIUM 9.7 11/11/2019 0924   CALCIUM 9.3 11/10/2019 1237   GFRNONAA >60 11/12/2019 0706   GFRNONAA >60 11/11/2019 0924   GFRNONAA >60 11/10/2019 1237   GFRAA >60 11/12/2019 0706   GFRAA >60 11/11/2019 0924   GFRAA >60 11/10/2019 1237   CBC    Component Value Date/Time   WBC 7.3 11/12/2019 0706   RBC 3.85 (L) 11/12/2019 0706   HGB 15.7 11/12/2019 0706   HCT 45.1 11/12/2019 0706   PLT 204 11/12/2019 0706   MCV 117.1 (H) 11/12/2019 0706   MCH 40.8 (H) 11/12/2019 0706   MCHC 34.8 11/12/2019 0706   RDW 13.3 11/12/2019 0706   LYMPHSABS 1.2 11/10/2019 1237   MONOABS 0.6 11/10/2019 1237   EOSABS 0.1 11/10/2019 1237   BASOSABS 0.1 11/10/2019 1237   HEPATIC Function Panel Recent Labs    03/27/19 1048 11/10/19 1237 11/11/19 0924  PROT 7.8 6.8 7.1   HEMOGLOBIN A1C No components found for: HGA1C,  MPG CARDIAC ENZYMES Lab Results  Component Value Date   TROPONINI <0.03 10/24/2018   TROPONINI 0.03 (HH) 10/23/2018   TROPONINI 0.04 (HH) 10/23/2018   BNP No results for input(s): PROBNP in the last 8760 hours. TSH Recent Labs    11/11/19 0924  TSH 2.775   CHOLESTEROL No results  for input(s): CHOL in the last 8760 hours.  Scheduled Meds: . [START ON 11/13/2019] amLODipine  10 mg Oral Daily  . apixaban  5 mg Oral BID  . furosemide  80 mg Intravenous Q12H  . losartan  50 mg Oral Daily  . metoprolol tartrate  25 mg Oral BID  . nicotine  21 mg Transdermal Daily  . potassium chloride SA  30 mEq Oral BID   Continuous Infusions: PRN Meds:.alum & mag hydroxide-simeth, guaiFENesin, hydrALAZINE, ondansetron **OR** ondansetron (ZOFRAN) IV, traMADol  Assessment/Plan: Acute combined systolic and diastolic left heart failure Dilated non-ischemic cardiomyopathy Uncontrolled hypertension Obesity Alcohol use disorder Tobacco use disorder Hypokalemia  Continue diuresis.  May use Torsemide 20 mg. PO daily from  tomorrow. Increase potassium supplement. Increase activity. Patient to follow heart failure booklet instructions.   LOS: 2 days   Time spent including chart review, lab review, examination, discussion with patient : 30 min   Dixie Dials  MD  11/12/2019, 11:38 AM

## 2019-11-12 NOTE — Progress Notes (Signed)
Patient discharged home. IV removed - WNL. Reviewed AVS and medications, emphasized importance of daily weights and low sodium diet.  Patient able to verbalize understanding and knowledge of HF management at home.  No questions at this time, assisted off unit ambulatory with RN.  Patient in NAD.

## 2019-11-12 NOTE — Discharge Summary (Signed)
Physician Discharge Summary  Jesus Fuller:[id number] DOB: 1976-04-27 DOA: 11/10/2019  PCP: Arvilla Market, DO  Admit date: 11/10/2019 Discharge date: 11/12/2019  Admitted From: Admitted from home Disposition: Home Recommendations for Outpatient Follow-up:  1. Follow up with PCP in 1-2 weeks 2  Please obtain BMP/CBC in one week: 3 follow-up with Dr. Algie Coffer  Home Health: None Equipment/Devices: None Discharge Condition: Stable and improved CODE STATUS: Full code Diet recommendation: Cardiac diet Brief/Interim Summary: 43 y.o.malewith medical history significant ofhypertension, nonischemic cardiomyopathy with EF of 35 to 40% on 10/25/2018 with subsequent cardiac catheterization showing normal coronaries; tobacco abuse, alcohol abuse, left leg DVT diagnosed a month ago currently on Eliquis presented with bilateral worsening leg swelling. He states that he has been having worsening bilateral leg swelling for the last 3 days, more on the right side along with difficulty walking and sharp pains. He feels that his thighs are full and also feels that his hips and abdomen has swollen. Denies any real shortness of breath or chest pain. No fever, nausea, vomiting, diarrhea. Has not noticed any decrease or increased urine output. Has been compliant with his Eliquis. No loss of consciousness, palpitations, blurring of vision, seizures.  ED Course:Pressure was extremely elevated. Ultrasound Doppler showed improvement of the left DVT and no clot as high as the iliacs bilaterally. CT venogram of the abdomen was negative for acute pathology or DVT and CTA chest was negative for PE. Hospitalist service was called to evaluate the patient  Discharge Diagnoses:  Principal Problem:   CHF exacerbation (HCC) Active Problems:   Hypertensive emergency   Tobacco abuse  #1 acute on chronic diastolic heart failure exacerbation likely secondary to uncontrolled hypertension.  Patient with  history of nonischemic cardiomyopathy with normal coronaries in April 2020.   CT venogram of the abdomen negative for DVT.  CT angiogram of the chest negative for pulmonary embolism. Continue Lasix 80 mg every 12.  Monitor renal functions closely.  Color Doppler of the lower extremity shows an acute DVT in the left lower extremity.  No DVT in the right lower extremity.  Echo done 11/11/2019 actually with improvement in his ejection fraction to 45 to 50%.  Left ventricle regional wall motion abnormalities demonstrated.  Grade 2 diastolic dysfunction. He was treated with IV Lasix beta-blocker and an ACE inhibitor.  He will be discharged home today on p.o. Lasix and beta-blocker and losartan.   #2 uncontrolled hypertension -blood pressure still high but improved 154/99.  Continue Lasix beta-blocker and losartan.  Follow-up with PCP and cardiologist.   #3 left lower extremity DVT continue Eliquis  #4 alcohol use patient reports he is cut down his drinking way down from little drinking hard liquor to 3-4 beers daily.  #5 tobacco abuse nicotine patch  #1 hypokalemia and hypomagnesemia repleted.   Discharge Instructions  Discharge Instructions    Call MD for:  difficulty breathing, headache or visual disturbances   Complete by: As directed    Diet - low sodium heart healthy   Complete by: As directed    Heart Failure patients record your daily weight using the same scale at the same time of day   Complete by: As directed    Increase activity slowly   Complete by: As directed      Allergies as of 11/12/2019   No Known Allergies     Medication List    STOP taking these medications   potassium chloride SA 20 MEQ tablet Commonly known as: KLOR-CON  TAKE these medications   acetaminophen 500 MG tablet Commonly known as: TYLENOL Take 500-1,000 mg by mouth every 6 (six) hours as needed for mild pain.   amLODipine 10 MG tablet Commonly known as: NORVASC Take 1 tablet  (10 mg total) by mouth daily. Start taking on: November 13, 2019   apixaban 5 MG Tabs tablet Commonly known as: ELIQUIS Take 1 tablet (5 mg total) by mouth 2 (two) times daily.   furosemide 40 MG tablet Commonly known as: Lasix Take 1 tablet (40 mg total) by mouth 2 (two) times daily.   losartan 50 MG tablet Commonly known as: COZAAR Take 2 tablets (100 mg total) by mouth daily. Start taking on: November 13, 2019   metoprolol tartrate 25 MG tablet Commonly known as: LOPRESSOR Take 1 tablet (25 mg total) by mouth 2 (two) times daily. What changed: when to take this   nicotine 21 mg/24hr patch Commonly known as: NICODERM CQ - dosed in mg/24 hours Place 1 patch (21 mg total) onto the skin daily. Start taking on: November 13, 2019   potassium chloride 10 MEQ tablet Commonly known as: KLOR-CON Take 1 tablet (10 mEq total) by mouth 2 (two) times daily.      Follow-up Information    Arvilla Market, DO Follow up.   Specialty: Family Medicine Contact information: [address] [place] [address] [phone]          No Known Allergies  Consultations: Dr. Algie Coffer  Procedures/Studies: CT Angio Chest PE W and/or Wo Contrast  Result Date: 11/10/2019 CLINICAL DATA:  Short of breath, tachycardia EXAM: CT ANGIOGRAPHY CHEST WITH CONTRAST TECHNIQUE: Multidetector CT imaging of the chest was performed using the standard protocol during bolus administration of intravenous contrast. Multiplanar CT image reconstructions and MIPs were obtained to evaluate the vascular anatomy. CONTRAST:  OMNIPAQUE IOHEXOL 350 MG/ML SOLN COMPARISON:  11/10/2019 FINDINGS: Cardiovascular: This is a technically adequate evaluation of the pulmonary vasculature. No filling defects or pulmonary emboli. Heart is unremarkable without pericardial effusion. Normal caliber of the thoracic aorta. Mediastinum/Nodes: Numerous borderline enlarged hilar and mediastinal lymph nodes, nonspecific. Largest at  the AP window measures 13 mm in short axis. Trachea, esophagus, and thyroid are unremarkable. Lungs/Pleura: No acute airspace disease, effusion, or pneumothorax. Central airways are widely patent. Upper Abdomen: No acute abnormality. Musculoskeletal: No acute or destructive bony lesions. Reconstructed images demonstrate no additional findings. Review of the MIP images confirms the above findings. IMPRESSION: 1. No CT evidence of pulmonary embolism. 2. No acute airspace disease. 3. Numerous borderline enlarged mediastinal and hilar lymph nodes, nonspecific. Electronically Signed   By: Sharlet Salina M.D.   On: 11/10/2019 15:05   DG Chest Portable 1 View  Result Date: 11/10/2019 CLINICAL DATA:  Tachycardia EXAM: PORTABLE CHEST 1 VIEW COMPARISON:  10/23/2018 FINDINGS: Cardiomegaly. Both lungs are clear. The visualized skeletal structures are unremarkable. IMPRESSION: Cardiomegaly without acute abnormality of the lungs in AP portable projection. Electronically Signed   By: Lauralyn Primes M.D.   On: 11/10/2019 12:48   ECHOCARDIOGRAM COMPLETE  Result Date: 11/11/2019    ECHOCARDIOGRAM REPORT   Patient Name:   Jesus Fuller Date of Exam: 11/11/2019 Medical Rec #:  [id number]      Height:       69.0 in Accession #:    [id number]     Weight:       229.7 lb Date of Birth:  1976/06/08      BSA:  2.191 m Patient Age:    43 years       BP:           160/93 mmHg Patient Gender: M              HR:           77 bpm. Exam Location:  Inpatient Procedure: 2D Echo and Intracardiac Opacification Agent Indications:     CHF-Acute Diastolic 428.31 / I50.31  History:         Patient has prior history of Echocardiogram examinations, most                  recent 10/25/2018. Risk Factors:Hypertension and Current Smoker.                  Nonischemic cardiomyopathy, ETOH Abuse, Asthma.  Sonographer:     Leeroy Bock Turrentine Referring Phys:  [id number] Glade Lloyd Diagnosing Phys: Orpah Cobb MD IMPRESSIONS  1. Left ventricular ejection  fraction, by estimation, is 45 to 50%. The left ventricle has mildly decreased function. The left ventricle demonstrates regional wall motion abnormalities (see scoring diagram/findings for description). Left ventricular diastolic parameters are consistent with Grade II diastolic dysfunction (pseudonormalization). There is mild hypokinesis of the left ventricular, basal anterior wall, septal wall and inferior wall.  2. Right ventricular systolic function is mildly reduced. The right ventricular size is mildly enlarged.  3. Left atrial size was moderately dilated.  4. Right atrial size was moderately dilated.  5. The mitral valve is normal in structure. Trivial mitral valve regurgitation.  6. The aortic valve is normal in structure. Aortic valve regurgitation is not visualized.  7. The inferior vena cava is normal in size with greater than 50% respiratory variability, suggesting right atrial pressure of 3 mmHg. FINDINGS  Left Ventricle: Left ventricular ejection fraction, by estimation, is 45 to 50%. The left ventricle has mildly decreased function. The left ventricle demonstrates regional wall motion abnormalities. Mild hypokinesis of the left ventricular, basal anterior wall, septal wall and inferior wall. Definity contrast agent was given IV to delineate the left ventricular endocardial borders. The left ventricular internal cavity size was normal in size. There is no left ventricular hypertrophy. Left ventricular diastolic parameters are consistent with Grade II diastolic dysfunction (pseudonormalization).  LV Wall Scoring: The basal anteroseptal segment, basal inferolateral segment, basal anterolateral segment, basal anterior segment, basal inferior segment, and basal inferoseptal segment are hypokinetic. The mid and distal anterior wall, mid and distal lateral wall, mid and distal anterior septum, entire apex, mid and distal inferior wall, mid anterolateral segment, and mid inferoseptal segment are normal.  Right Ventricle: The right ventricular size is mildly enlarged. No increase in right ventricular wall thickness. Right ventricular systolic function is mildly reduced. Left Atrium: Left atrial size was moderately dilated. Right Atrium: Right atrial size was moderately dilated. Pericardium: There is no evidence of pericardial effusion. Mitral Valve: The mitral valve is normal in structure. Trivial mitral valve regurgitation. Tricuspid Valve: The tricuspid valve is normal in structure. Tricuspid valve regurgitation is trivial. Aortic Valve: The aortic valve is normal in structure. Aortic valve regurgitation is not visualized. Pulmonic Valve: The pulmonic valve was grossly normal. Pulmonic valve regurgitation is not visualized. Aorta: The aortic root is normal in size and structure. Venous: The inferior vena cava is normal in size with greater than 50% respiratory variability, suggesting right atrial pressure of 3 mmHg. IAS/Shunts: No atrial level shunt detected by color flow Doppler.  LEFT VENTRICLE PLAX 2D LVIDd:  5.07 cm  Diastology LVIDs:         3.77 cm  LV e' lateral:   11.50 cm/s LV PW:         1.03 cm  LV E/e' lateral: 7.9 LV IVS:        1.04 cm  LV e' medial:    7.73 cm/s LVOT diam:     2.30 cm  LV E/e' medial:  11.8 LV SV:         65 LV SV Index:   30 LVOT Area:     4.15 cm  RIGHT VENTRICLE RV S prime:     14.70 cm/s TAPSE (M-mode): 2.4 cm LEFT ATRIUM             Index       RIGHT ATRIUM           Index LA diam:        3.40 cm 1.55 cm/m  RA Area:     19.80 cm LA Vol (A2C):   56.9 ml 25.97 ml/m RA Volume:   58.30 ml  26.61 ml/m LA Vol (A4C):   55.0 ml 25.10 ml/m LA Biplane Vol: 58.1 ml 26.51 ml/m  AORTIC VALVE LVOT Vmax:   97.90 cm/s LVOT Vmean:  62.000 cm/s LVOT VTI:    0.156 m  AORTA Ao Root diam: 3.30 cm MITRAL VALVE MV Area (PHT): 3.72 cm    SHUNTS MV Decel Time: 204 msec    Systemic VTI:  0.16 m MV E velocity: 91.30 cm/s  Systemic Diam: 2.30 cm MV A velocity: 55.70 cm/s MV E/A ratio:  1.64  Orpah Cobb MD Electronically signed by Orpah Cobb MD Signature Date/Time: 11/11/2019/10:51:48 AM    Final    CT VENOGRAM ABD/PEL  Result Date: 11/10/2019 CLINICAL DATA:  44 year old male with bilateral lower extremity swelling and concern for DVT. EXAM: CT ABDOMEN AND PELVIS WITH CONTRAST TECHNIQUE: Multidetector CT imaging of the abdomen and pelvis was performed using the standard protocol following bolus administration of intravenous contrast. CONTRAST:  OMNIPAQUE IOHEXOL 350 MG/ML SOLN COMPARISON:  None. FINDINGS: Lower chest: Minimal bibasilar dependent atelectasis. The visualized lung bases are otherwise clear. No intra-abdominal free air or free fluid. Hepatobiliary: Mild fatty infiltration of the liver. No intrahepatic biliary ductal dilatation. The gallbladder is unremarkable. Pancreas: Unremarkable. No pancreatic ductal dilatation or surrounding inflammatory changes. Spleen: Normal in size without focal abnormality. Adrenals/Urinary Tract: The adrenal glands unremarkable. There is no hydronephrosis on either side. There is symmetric enhancement and excretion of contrast by both kidneys. The visualized ureters and urinary bladder appear unremarkable. Stomach/Bowel: There are small scattered sigmoid and colonic diverticula without active inflammatory changes. Minimal thickened appearance of the sigmoid colon, likely related to underdistention. Mild colitis is less likely. Clinical correlation is recommended. There is no bowel obstruction. The appendix is normal. Vascular/Lymphatic: Mild aortoiliac atherosclerotic disease. The visualized common femoral veins, iliac veins, and the IVC appear unremarkable for the degree of opacification. No obvious clot identified within the visualized portions of these veins. Evaluation is however somewhat limited due to suboptimal opacification. There is no portal venous gas. Several mildly rounded small retroperitoneal lymph nodes, indeterminate, possibly  reactive. Clinical correlation is recommended. Reproductive: The prostate and seminal vesicles are grossly unremarkable. Other: Partially visualized small bilateral hydroceles. Mild diffuse subcutaneous edema. Musculoskeletal: Degenerative changes of the spine with lower lumbar disc desiccation and vacuum phenomena. No acute osseous pathology. IMPRESSION: 1. No acute intra-abdominal or pelvic pathology. No definite DVT identified in the  visualized major veins. 2. No bowel obstruction. Normal appendix. 3. Colonic diverticulosis. 4. Mild fatty liver. 5. Aortic Atherosclerosis (ICD10-I70.0). Electronically Signed   By: Anner Crete M.D.   On: 11/10/2019 15:13   VAS [place] LOWER EXTREMITY VENOUS (DVT) (ONLY [facility] & [facility])  Result Date: 11/11/2019  Lower Venous DVTStudy Indications: Pain, Swelling, and history of DVT.  Limitations: Poor ultrasound/tissue interface. Comparison Study: 09/28/2019- acute DVT left distal femoral vein, left popliteal                   vein, left posterior tibial veins, left peroneal veins. Performing Technologist: Maudry Mayhew MHA, RDMS, RVT, RDCS  Examination Guidelines: A complete evaluation includes B-mode imaging, spectral Doppler, color Doppler, and power Doppler as needed of all accessible portions of each vessel. Bilateral testing is considered an integral part of a complete examination. Limited examinations for reoccurring indications may be performed as noted. The reflux portion of the exam is performed with the patient in reverse Trendelenburg.  +---------+---------------+---------+-----------+----------+--------------+ RIGHT    CompressibilityPhasicitySpontaneityPropertiesThrombus Aging +---------+---------------+---------+-----------+----------+--------------+ CFV      Full           No       Yes                                 +---------+---------------+---------+-----------+----------+--------------+ SFJ      Full                                                         +---------+---------------+---------+-----------+----------+--------------+ FV Prox  Full                                                        +---------+---------------+---------+-----------+----------+--------------+ FV Mid   Full                                                        +---------+---------------+---------+-----------+----------+--------------+ FV DistalFull                                                        +---------+---------------+---------+-----------+----------+--------------+ PFV      Full                                                        +---------+---------------+---------+-----------+----------+--------------+ POP      Full           No       Yes                                 +---------+---------------+---------+-----------+----------+--------------+  PTV      Full                                                        +---------+---------------+---------+-----------+----------+--------------+ PERO     Full                                                        +---------+---------------+---------+-----------+----------+--------------+ CIV                              No                                  +---------+---------------+---------+-----------+----------+--------------+   +---------+---------------+---------+-----------+----------+--------------+ LEFT     CompressibilityPhasicitySpontaneityPropertiesThrombus Aging +---------+---------------+---------+-----------+----------+--------------+ CFV      Full           No       Yes                                 +---------+---------------+---------+-----------+----------+--------------+ SFJ      Full                                                        +---------+---------------+---------+-----------+----------+--------------+ FV Prox  Full                                                         +---------+---------------+---------+-----------+----------+--------------+ FV Mid   Full                                                        +---------+---------------+---------+-----------+----------+--------------+ FV DistalFull                                                        +---------+---------------+---------+-----------+----------+--------------+ PFV      Full                                                        +---------+---------------+---------+-----------+----------+--------------+ POP      None                    No  Acute          +---------+---------------+---------+-----------+----------+--------------+ PTV                              No                   Acute          +---------+---------------+---------+-----------+----------+--------------+ PERO                             No                   Acute          +---------+---------------+---------+-----------+----------+--------------+ CIV                              Yes                                 +---------+---------------+---------+-----------+----------+--------------+   Left Technical Findings: Not visualized segments include inadequate visualization of PTV and peroneal veins in transverse to perform adequate compression maneuvers.  Unable to visualize IVC due to overlying bowel gas.  Summary: RIGHT: - There is no evidence of deep vein thrombosis in the lower extremity. However, portions of this examination were limited- see technologist comments above.  LEFT: - Findings consistent with acute deep vein thrombosis involving the left popliteal vein, left posterior tibial veins, and left peroneal veins. When compared to the prior study, there is no significant change. - No cystic structure found in the popliteal fossa.  Pulsatile lower extremity venous flow is suggestive of possibly elevated right heart pressure.  *See table(s) above for measurements and  observations. Electronically signed by Coral Else MD on 11/11/2019 at 8:56:11 AM.    Final     (Echo, Carotid, EGD, Colonoscopy, ERCP)    Subjective: Patient resting in bed ambulated in the room feels breathing better anxious to go home  Discharge Exam: Vitals:   11/12/19 0500 11/12/19 0604  BP: (!) 162/117 (!) 154/99  Pulse: 79 70  Resp: 18   Temp: 98.3 F (36.8 C)   SpO2: 93%    Vitals:   11/11/19 2100 11/12/19 0500 11/12/19 0516 11/12/19 0604  BP: (!) 149/94 (!) 162/117  (!) 154/99  Pulse: 72 79  70  Resp: 19 18    Temp: 98.8 F (37.1 C) 98.3 F (36.8 C)    TempSrc: Oral Oral    SpO2: 95% 93%    Weight:   105.9 kg   Height:        General: Pt is alert, awake, not in acute distress Cardiovascular: RRR, S1/S2 +, no rubs, no gallops Respiratory diminished breath sounds at the bases bilaterally, no wheezing, no rhonchi Abdominal: Soft, NT, ND, bowel sounds + Extremities: 2+ right lower extremity edema 1+ left lower extremity edema   The results of significant diagnostics from this hospitalization (including imaging, microbiology, ancillary and laboratory) are listed below for reference.     Microbiology: Recent Results (from the past 240 hour(s))  SARS CORONAVIRUS 2 (TAT 6-24 HRS) Nasopharyngeal     Status: None   Collection Time: 11/10/19  2:38 PM   Specimen: Nasopharyngeal  Result Value Ref Range Status   SARS Coronavirus 2 NEGATIVE NEGATIVE Final    Comment: (NOTE) SARS-CoV-2 target nucleic  acids are NOT DETECTED. The SARS-CoV-2 RNA is generally detectable in upper and lower respiratory specimens during the acute phase of infection. Negative results do not preclude SARS-CoV-2 infection, do not rule out co-infections with other pathogens, and should not be used as the sole basis for treatment or other patient management decisions. Negative results must be combined with clinical observations, patient history, and epidemiological information. The  expected result is Negative. Fact Sheet for Patients: HairSlick.no Fact Sheet for Healthcare Providers: quierodirigir.com This test is not yet approved or cleared by the [place] FDA and  has been authorized for detection and/or diagnosis of SARS-CoV-2 by FDA under an Emergency Use Authorization (EUA). This EUA will remain  in effect (meaning this test can be used) for the duration of the COVID-19 declaration under Section 56 4(b)(1) of the Act, 21 U.S.C. section 360bbb-3(b)(1), unless the authorization is terminated or revoked sooner. Performed at [facility], [address] [place], [place] [address]      Labs: BNP (last 3 results) Recent Labs    11/10/19 1237  BNP 418.4*   Basic Metabolic Panel: Recent Labs  Lab 11/10/19 1237 11/11/19 0924 11/12/19 0706  NA 142 140 139  K 3.3* 3.3* 3.4*  CL 101 95* 95*  CO2 30 35* 33*  GLUCOSE 105* 113* 106*  BUN 6 10 13   CREATININE 0.81 1.00 0.90  CALCIUM 9.3 9.7 9.6  MG  --  1.7  --    Liver Function Tests: Recent Labs  Lab 11/10/19 1237 11/11/19 0924  AST 48* 42*  ALT 45* 39  ALKPHOS 67 70  BILITOT 1.0 2.1*  PROT 6.8 7.1  ALBUMIN 3.9 4.2   No results for input(s): LIPASE, AMYLASE in the last 168 hours. No results for input(s): AMMONIA in the last 168 hours. CBC: Recent Labs  Lab 11/10/19 1237 11/11/19 0924 11/12/19 0706  WBC 7.1 6.3 7.3  NEUTROABS 5.2  --   --   HGB 14.9 15.2 15.7  HCT 42.1 44.2 45.1  MCV 115.3* 116.9* 117.1*  PLT 225 216 204   Cardiac Enzymes: No results for input(s): CKTOTAL, CKMB, CKMBINDEX, TROPONINI in the last 168 hours. BNP: Invalid input(s): POCBNP CBG: No results for input(s): GLUCAP in the last 168 hours. D-Dimer No results for input(s): DDIMER in the last 72 hours. Hgb A1c No results for input(s): HGBA1C in the last 72 hours. Lipid Profile No results for input(s): CHOL, HDL, LDLCALC, TRIG, CHOLHDL,  LDLDIRECT in the last 72 hours. Thyroid function studies Recent Labs    11/11/19 0924  TSH 2.775   Anemia work up Recent Labs    11/11/19 0924  VITAMINB12 144*  FOLATE 7.1   Urinalysis    Component Value Date/Time   COLORURINE YELLOW 11/10/2019 1438   APPEARANCEUR CLEAR 11/10/2019 1438   LABSPEC 1.015 11/10/2019 1438   PHURINE 7.0 11/10/2019 1438   GLUCOSEU NEGATIVE 11/10/2019 1438   HGBUR NEGATIVE 11/10/2019 1438   BILIRUBINUR NEGATIVE 11/10/2019 1438   KETONESUR NEGATIVE 11/10/2019 1438   PROTEINUR NEGATIVE 11/10/2019 1438   NITRITE NEGATIVE 11/10/2019 1438   LEUKOCYTESUR NEGATIVE 11/10/2019 1438   Sepsis Labs Invalid input(s): PROCALCITONIN,  WBC,  LACTICIDVEN Microbiology Recent Results (from the past 240 hour(s))  SARS CORONAVIRUS 2 (TAT 6-24 HRS) Nasopharyngeal     Status: None   Collection Time: 11/10/19  2:38 PM   Specimen: Nasopharyngeal  Result Value Ref Range Status   SARS Coronavirus 2 NEGATIVE NEGATIVE Final    Comment: (NOTE) SARS-CoV-2  target nucleic acids are NOT DETECTED. The SARS-CoV-2 RNA is generally detectable in upper and lower respiratory specimens during the acute phase of infection. Negative results do not preclude SARS-CoV-2 infection, do not rule out co-infections with other pathogens, and should not be used as the sole basis for treatment or other patient management decisions. Negative results must be combined with clinical observations, patient history, and epidemiological information. The expected result is Negative. Fact Sheet for Patients: HairSlick.no Fact Sheet for Healthcare Providers: quierodirigir.com This test is not yet approved or cleared by the [place] FDA and  has been authorized for detection and/or diagnosis of SARS-CoV-2 by FDA under an Emergency Use Authorization (EUA). This EUA will remain  in effect (meaning this test can be used) for the duration of  the COVID-19 declaration under Section 56 4(b)(1) of the Act, 21 U.S.C. section 360bbb-3(b)(1), unless the authorization is terminated or revoked sooner. Performed at [facility], [address] [place], [place] [address]      Time coordinating discharge: 38 minutes  SIGNED:   Alwyn Ren, MD  [facility] 11/12/2019, 11:32 AM Pager   If 7PM-7AM, please contact night-coverage [email or web address] Password [email or web address]

## 2019-11-12 NOTE — Progress Notes (Signed)
Central Telemetry reported a brief decrease of patients Heart rate to 27 bpm and went back up to 60's . Patient was sleeping when checked.

## 2019-11-13 ENCOUNTER — Telehealth: Payer: Self-pay

## 2019-11-13 MED FILL — ELIQUIS 5 MG TABLET: 5 | 30 days supply | Qty: 60 | Fill #1

## 2019-11-13 NOTE — Telephone Encounter (Signed)
Transition Care Management Follow-up Telephone Call  Date of discharge and from where: 11/12/2019, [facility]   How have you been since you were released from the hospital?  he said he is doing pretty good and noted that the leg edema " went down a lot."    Any questions or concerns?  none at this time   Items Reviewed:  Did the pt receive and understand the discharge instructions provided? yes  Medications obtained and verified?  he said that he has all medications, including the new ones.  He just needs to pick up a refill of eliquis this afternoon but he has enough for today. He didn't have any questions about his meds.  He said that he is strictly following the instructions.   Any new allergies since your discharge? none reported   Do you have support at home? he lives next door to his mother  Other (ie: DME, Home Health, etc) no home health or DME ordered.   He said that he has applied for medicaid and he can contact the financial counselor at the hospital if he needs additional assistance.  Only income is unemployment.   Functional Questionnaire: (I = Independent and D = Dependent) ADL's: independent   Follow up appointments reviewed:    PCP Hospital f/u appt confirmed?.Dr Earlene Plater  - 11/20/2019 @ 1050  Specialist Hospital f/u appt confirmed? cardiology has not been scheduled  Are transportation arrangements needed?  no  If their condition worsens, is the pt aware to call  their PCP or go to the ED? yes  Was the patient provided with contact information for the PCP's office or ED?  he has the clinic phone number  Was the pt encouraged to call back with questions or concerns?  yes

## 2019-11-20 ENCOUNTER — Telehealth: Payer: Self-pay | Admitting: Internal Medicine

## 2019-12-02 ENCOUNTER — Encounter: Payer: Self-pay | Admitting: Internal Medicine

## 2019-12-02 ENCOUNTER — Telehealth (INDEPENDENT_AMBULATORY_CARE_PROVIDER_SITE_OTHER): Payer: Self-pay | Admitting: Internal Medicine

## 2019-12-02 DIAGNOSIS — Z09 Encounter for follow-up examination after completed treatment for conditions other than malignant neoplasm: Secondary | ICD-10-CM

## 2019-12-02 DIAGNOSIS — I509 Heart failure, unspecified: Secondary | ICD-10-CM

## 2019-12-02 DIAGNOSIS — I10 Essential (primary) hypertension: Secondary | ICD-10-CM | POA: Insufficient documentation

## 2019-12-02 NOTE — Progress Notes (Signed)
Virtual Visit via Telephone Note  I connected with DEMARIOUS KAPUR, on 12/02/2019 at 2:15 PM by telephone due to the COVID-19 pandemic and verified that I am speaking with the correct person using two identifiers.   Consent: I discussed the limitations, risks, security and privacy concerns of performing an evaluation and management service by telephone and the availability of in person appointments. I also discussed with the patient that there may be a patient responsible charge related to this service. The patient expressed understanding and agreed to proceed.   Location of Patient: Home   Location of Provider: Clinic    Persons participating in Telemedicine visit: Wilson M Dshaun [facility] Dr. Earlene Plater      History of Present Illness: Patient has a visit for hospital follow up. He was hospitalized from 4/25-4/27 acute on chronic diastolic HF exacerbation. He was discharged on Lasix, Metoprolol, Losartan. He was able to obtain all of his medications and is not having any side effects. He is monitoring his BPs at home. He reports that numbers have been a lot lower. His top number has not been any higher than 140. No headaches, vision changes, chest pain.    Hospital D/C Summary:  #1 acute on chronic diastolic heart failure exacerbation likely secondary to uncontrolled hypertension. Patient with history of nonischemic cardiomyopathy with normal coronaries in April 2020.  CT venogram of the abdomen negative for DVT.  CT angiogram of the chest negative for pulmonary embolism. Continue Lasix 80 mg every 12. Monitor renal functions closely.  Color Doppler of the lower extremity shows an acute DVT in the left lower extremity.  No DVT in the right lower extremity.  Echo done 11/11/2019 actually with improvement in his ejection fraction to 45 to 50%. Left ventricle regional wall motion abnormalities demonstrated. Grade 2 diastolic dysfunction. He was treated with IV Lasix  beta-blocker and an ACE inhibitor.  He will be discharged home today on p.o. Lasix and beta-blocker and losartan.   #2 uncontrolled hypertension-blood pressure still high but improved 154/99.  Continue Lasix beta-blocker and losartan.  Follow-up with PCP and cardiologist.   #3 left lower extremity DVT continue Eliquis  #4 alcohol use patient reports he is cut down his drinking way down from little drinking hard liquor to 3-4 beers daily.  #5 tobacco abuse nicotine patch  #1 hypokalemia and hypomagnesemia repleted.    Past Medical History:  Diagnosis Date  . Asthma   . Hypertension    Takes no medicine   No Known Allergies  Current Outpatient Medications on File Prior to Visit  Medication Sig Dispense Refill  . amLODipine (NORVASC) 10 MG tablet Take 1 tablet (10 mg total) by mouth daily. 30 tablet 3  . apixaban (ELIQUIS) 5 MG TABS tablet Take 1 tablet (5 mg total) by mouth 2 (two) times daily. 60 tablet 3  . furosemide (LASIX) 40 MG tablet Take 1 tablet (40 mg total) by mouth 2 (two) times daily. 30 tablet 3  . losartan (COZAAR) 50 MG tablet Take 2 tablets (100 mg total) by mouth daily. 60 tablet 3  . metoprolol tartrate (LOPRESSOR) 25 MG tablet Take 1 tablet (25 mg total) by mouth 2 (two) times daily. 60 tablet 3  . nicotine (NICODERM CQ - DOSED IN MG/24 HOURS) 21 mg/24hr patch Place 1 patch (21 mg total) onto the skin daily. 28 patch 0  . potassium chloride (KLOR-CON) 10 MEQ tablet Take 1 tablet (10 mEq total) by mouth 2 (two) times daily. 60 tablet  3  . [DISCONTINUED] atorvastatin (LIPITOR) 40 MG tablet Take 1 tablet (40 mg total) by mouth daily at 6 PM. (Patient not taking: Reported on 03/27/2019) 30 tablet 3   No current facility-administered medications on file prior to visit.    Observations/Objective: NAD. Speaking clearly.  Work of breathing normal.  Alert and oriented. Mood appropriate.   Assessment and Plan:  1. Hospital discharge follow-up - Basic metabolic  panel; Future - CBC; Future  2. Chronic congestive heart failure, unspecified heart failure type ([place]) Volume status seems more regulated. Compliant with medications. Has not been able to establish cardiology follow up yet. Will place referral to assist.  - Ambulatory referral to Cardiology  3. Essential hypertension BP more controlled on home readings. Will plan to monitor at future in person visits and adjust medication as needed. Check BMET with start of Losartan while hospitalized.  - Ambulatory referral to Cardiology - Basic metabolic panel; Future   Follow Up Instructions: Lab visit 5/18    I discussed the assessment and treatment plan with the patient. The patient was provided an opportunity to ask questions and all were answered. The patient agreed with the plan and demonstrated an understanding of the instructions.   The patient was advised to call back or seek an in-person evaluation if the symptoms worsen or if the condition fails to improve as anticipated.     I provided 12 minutes total of non-face-to-face time during this encounter including median intraservice time, reviewing previous notes, investigations, ordering medications, medical decision making, coordinating care and patient verbalized understanding at the end of the visit.    Phill Myron, D.O. Primary [facility] at [facility]  12/02/2019, 2:15 PM

## 2019-12-03 ENCOUNTER — Other Ambulatory Visit (INDEPENDENT_AMBULATORY_CARE_PROVIDER_SITE_OTHER): Payer: Self-pay

## 2019-12-03 ENCOUNTER — Other Ambulatory Visit: Payer: Self-pay

## 2019-12-03 DIAGNOSIS — I10 Essential (primary) hypertension: Secondary | ICD-10-CM

## 2019-12-03 DIAGNOSIS — Z09 Encounter for follow-up examination after completed treatment for conditions other than malignant neoplasm: Secondary | ICD-10-CM

## 2019-12-03 DIAGNOSIS — E876 Hypokalemia: Secondary | ICD-10-CM

## 2019-12-04 LAB — BASIC METABOLIC PANEL
BUN/Creatinine Ratio: 12 (ref 9–20)
BUN: 17 mg/dL (ref 6–24)
CO2: 27 mmol/L (ref 20–29)
Calcium: 10.3 mg/dL — ABNORMAL HIGH (ref 8.7–10.2)
Chloride: 96 mmol/L (ref 96–106)
Creatinine, Ser: 1.43 mg/dL — ABNORMAL HIGH (ref 0.76–1.27)
GFR calc Af Amer: 70 mL/min/{1.73_m2} (ref >59)
GFR calc non Af Amer: 60 mL/min/{1.73_m2} (ref >59)
Glucose: 138 mg/dL — ABNORMAL HIGH (ref 65–99)
Potassium: 4.4 mmol/L (ref 3.5–5.2)
Sodium: 140 mmol/L (ref 134–144)

## 2019-12-04 LAB — CBC
Hematocrit: 50.1 % (ref 37.5–51.0)
Hemoglobin: 17.2 g/dL (ref 13.0–17.7)
MCH: 39.2 pg — ABNORMAL HIGH (ref 26.6–33.0)
MCHC: 34.3 g/dL (ref 31.5–35.7)
MCV: 114 fL — ABNORMAL HIGH (ref 79–97)
Platelets: 184 10*3/uL (ref 150–450)
RBC: 4.39 x10E6/uL (ref 4.14–5.80)
RDW: 12.6 % (ref 11.6–15.4)
WBC: 9.1 10*3/uL (ref 3.4–10.8)

## 2019-12-05 ENCOUNTER — Other Ambulatory Visit: Payer: Self-pay | Admitting: Cardiology

## 2019-12-05 ENCOUNTER — Other Ambulatory Visit: Payer: Self-pay

## 2019-12-05 ENCOUNTER — Encounter: Payer: Self-pay | Admitting: Cardiology

## 2019-12-05 ENCOUNTER — Ambulatory Visit (INDEPENDENT_AMBULATORY_CARE_PROVIDER_SITE_OTHER): Payer: Self-pay | Admitting: Cardiology

## 2019-12-05 VITALS — BP 130/84 | HR 85 | Temp 97.1°F | Ht 69.0 in | Wt 232.0 lb

## 2019-12-05 DIAGNOSIS — Z7189 Other specified counseling: Secondary | ICD-10-CM

## 2019-12-05 DIAGNOSIS — I428 Other cardiomyopathies: Secondary | ICD-10-CM

## 2019-12-05 DIAGNOSIS — I5042 Chronic combined systolic (congestive) and diastolic (congestive) heart failure: Secondary | ICD-10-CM

## 2019-12-05 DIAGNOSIS — Z86718 Personal history of other venous thrombosis and embolism: Secondary | ICD-10-CM

## 2019-12-05 DIAGNOSIS — Z716 Tobacco abuse counseling: Secondary | ICD-10-CM

## 2019-12-05 DIAGNOSIS — I10 Essential (primary) hypertension: Secondary | ICD-10-CM

## 2019-12-05 MED ORDER — CARVEDILOL 12.5 MG PO TABS: 12.5000 mg | ORAL_TABLET | Freq: Two times a day (BID) | ORAL | 3 refills | Status: DC

## 2019-12-05 NOTE — Patient Instructions (Addendum)
Medication Instructions:  Stop taking Metoprolol 25 mg twice a day Start Carvedilol 12.5 mg two a day  *If you need a refill on your cardiac medications before your next appointment, please call your pharmacy*   Lab Work: None  Testing/Procedures: None   Follow-Up: At [facility], you and your health needs are our priority.  As part of our continuing mission to provide you with exceptional heart care, we have created designated Provider Care Teams.  These Care Teams include your primary Cardiologist (physician) and Advanced Practice Providers (APPs -  Physician Assistants and Nurse Practitioners) who all work together to provide you with the care you need, when you need it.  We recommend signing up for the patient portal called "MyChart".  Sign up information is provided on this After Visit Summary.  MyChart is used to connect with patients for Virtual Visits (Telemedicine).  Patients are able to view lab/test results, encounter notes, upcoming appointments, etc.  Non-urgent messages can be sent to your provider as well.   To learn more about what you can do with MyChart, go to [email or web address].    Your next appointment:   3 month(s)  The format for your next appointment:   In Person  Provider:   Jodelle Red, MD     Do the following things EVERY DAY:  1) Weigh yourself EVERY morning after you go to the bathroom but before you eat or drink anything. Write this number down in a weight log/diary. If you gain 3 pounds overnight or 5 pounds in a week, call the office.  2) Take your medicines as prescribed. If you have concerns about your medications, please call us before you stop taking them.   3) Eat low salt foods-Limit salt (sodium) to 2000 mg per day. This will help prevent your body from holding onto fluid. Read food labels as many processed foods have a lot of sodium, especially canned goods and prepackaged meats. If you would like some assistance choosing  low sodium foods, we would be happy to set you up with a nutritionist.  4) Stay as active as you can everyday. Staying active will give you more energy and make your muscles stronger. Start with 5 minutes at a time and work your way up to 30 minutes a day. Break up your activities--do some in the morning and some in the afternoon. Start with 3 days per week and work your way up to 5 days as you can.  If you have chest pain, feel short of breath, dizzy, or lightheaded, STOP. If you don't feel better after a short rest, call 911. If you do feel better, call the office to let us know you have symptoms with exercise.  5) Limit all fluids for the day to less than 2 liters. Fluid includes all drinks, coffee, juice, ice chips, soup, jello, and all other liquids.

## 2019-12-05 NOTE — Progress Notes (Signed)
Cardiology Office Note:    Date:  12/05/2019   ID:  Jesus Fuller, DOB [birth year], MRN [id number]  PCP:  Jesus Market, DO  Cardiologist:  Jesus Red, MD  Referring MD: Jesus Fuller*   CC: establish care  History of Present Illness:    Jesus Fuller is a 44 y.o. male with a hx of chronic systolic and diastolic heart failure, nonischemic cardiomyopathy, hypertension (previously uncontrolled), alcohol/tobacco use, LLE DVT on apixaban who is seen as a new consult at the request of Jesus Fuller* for the evaluation and management of heart failure with recent hospitalization.  He was discharged from the hospital on 11/12/19. He was seen by Dr. Algie Coffer during that hospitalization. Noted to have acute on chronic combined systolic and diastolic heart failure, dilated nonischemic cardiomyopathy, hypertension, alcohol/tobacco use during this hospitalization.  Recent note dated 12/02/19 with Dr. Earlene Fuller reviewed. Improved post discharge. Good BP control reported at home but elevated in office.  Today: Feeling very good recently. Best BP reading today that he has had in a while. Has occasional nerve pain in 4th/5th finger on the left side. Also with occasional leg numbness/back pain when he walks for a while.   He wasn't seen often by doctors, not sure how long he had high blood pressure. First formal diagnosis of heart issues 10/2018. Was using a lot of salt at the time, has cut out. Had been feeling bad for a time before this.   Doesn't weigh himself at home. Thinks his weight is usually 190-200 lbs when he is at his peak health. Thinks he is down 17 lbs from fluid loss at his hospitalization. Checks BP at home, no log today but thinks his range has been 160/110 at the peak. No low numbers.  Son (34 yo) diagnosed with hypertension, much improved with medication. Patient's brother and mother also with high blood pressure. Thinks a few have had heart attack  or stroke, not sure exactly.  Denies chest pain, shortness of breath at rest or with normal exertion. No PND, orthopnea, LE edema or unexpected weight gain. No syncope or palpitations. Near syncope after he choked on food, no other events.  Reviewed BP medications: Amlodipine 10 mg daily Furosemide 40 mg BID with potassium 10 mEq BID Losartan 100 mg daily Metoprolol tartrate 25 mg BID (changing to 12.5 mg carvedilol BID today)  Able to mow his yard (takes about 2 hours), yardwork, etc.  Working on cutting back on tobacco and alcohol. Sometimes still with occasional shots/beer at night, but trying not to do every night. Occasional [employer] but tries to watch salt. Using nicotine patch, trying to cut back from 2 ppd to 1-1.5 ppd.  He doesn't know how long he was told to take the apixaban. States no one talked to him about this. Will defer to PCP, but discussed provoked vs. Unprovoked DVT and how treatment can be different depending on risk of recurrence. He has 3 mos of apixaban, and he will discuss with his PCP if this is the long term plan.  Past Medical History:  Diagnosis Date  . Asthma   . Hypertension    Takes no medicine    Past Surgical History:  Procedure Laterality Date  . RIGHT/LEFT HEART CATH AND CORONARY ANGIOGRAPHY N/A 10/24/2018   Procedure: RIGHT/LEFT HEART CATH AND CORONARY ANGIOGRAPHY;  Surgeon: Orpah Cobb, MD;  [facility];  Service: Cardiovascular;  Laterality: N/A;    Current Medications: Current Outpatient Medications on File  Prior to Visit  Medication Sig  . amLODipine (NORVASC) 10 MG tablet Take 1 tablet (10 mg total) by mouth daily.  Marland Kitchen apixaban (ELIQUIS) 5 MG TABS tablet Take 1 tablet (5 mg total) by mouth 2 (two) times daily.  . furosemide (LASIX) 40 MG tablet Take 1 tablet (40 mg total) by mouth 2 (two) times daily.  Marland Kitchen losartan (COZAAR) 50 MG tablet Take 2 tablets (100 mg total) by mouth daily.  . metoprolol tartrate (LOPRESSOR) 25 MG  tablet Take 1 tablet (25 mg total) by mouth 2 (two) times daily.  . nicotine (NICODERM CQ - DOSED IN MG/24 HOURS) 21 mg/24hr patch Place 1 patch (21 mg total) onto the skin daily.  . potassium chloride (KLOR-CON) 10 MEQ tablet Take 1 tablet (10 mEq total) by mouth 2 (two) times daily.  . [DISCONTINUED] atorvastatin (LIPITOR) 40 MG tablet Take 1 tablet (40 mg total) by mouth daily at 6 PM. (Patient not taking: Reported on 03/27/2019)   No current facility-administered medications on file prior to visit.     Allergies:   Patient has no known allergies.   Social History   Tobacco Use  . Smoking status: Current Every Day Smoker    Packs/day: 1.50    Types: Cigarettes  . Smokeless tobacco: Never Used  Substance Use Topics  . Alcohol use: Yes    Comment: occasional  . Drug use: No    Family History: family history includes COPD in his mother; Cancer in his mother; Diabetes in his father.  ROS:   Please see the history of present illness.  Additional pertinent ROS: Constitutional: Negative for chills, fever, night sweats, unintentional weight loss  HENT: Negative for ear pain and hearing loss.   Eyes: Negative for loss of vision and eye pain.  Respiratory: Negative for cough, sputum, wheezing.   Cardiovascular: See HPI. Gastrointestinal: Negative for abdominal pain, melena, and hematochezia.  Genitourinary: Negative for dysuria and hematuria.  Musculoskeletal: Negative for falls and myalgias.  Skin: Negative for itching and rash.  Neurological: Negative for focal weakness, focal sensory changes and loss of consciousness.  Endo/Heme/Allergies: Does not bruise/bleed easily.     EKGs/Labs/Other Studies Reviewed:    The following studies were reviewed today: Echo 11/11/19  1. Left ventricular ejection fraction, by estimation, is 45 to 50%. The  left ventricle has mildly decreased function. The left ventricle  demonstrates regional wall motion abnormalities (see scoring    diagram/findings for description). Left ventricular  diastolic parameters are consistent with Grade II diastolic dysfunction  (pseudonormalization). There is mild hypokinesis of the left ventricular,  basal anterior wall, septal wall and inferior wall.  2. Right ventricular systolic function is mildly reduced. The right  ventricular size is mildly enlarged.  3. Left atrial size was moderately dilated.  4. Right atrial size was moderately dilated.  5. The mitral valve is normal in structure. Trivial mitral valve  regurgitation.  6. The aortic valve is normal in structure. Aortic valve regurgitation is  not visualized.  7. The inferior vena cava is normal in size with greater than 50%  respiratory variability, suggesting right atrial pressure of 3 mmHg.   Echo 10/25/18 1. Mild hypokinesis of the left ventricular, entire anteroseptal wall and  inferolateral wall.  2. The left ventricle has moderately reduced systolic function, with an  ejection fraction of 35-40%. The cavity size was normal. Left ventricular  diastolic Doppler parameters are consistent with impaired relaxation.  3. The right ventricle has normal systolic function. The  cavity was  normal. There is no increase in right ventricular wall thickness.   R/LHC 10/24/18 Normal cors, normal LVEDP. CO 5.3, CI 2.61, RA mean 0, PA 21/7 (13). Wedge mean 1. LVEDP 4. Ao 122/81.  EKG:  EKG is personally reviewed.  The ekg ordered today demonstrates NSR at 85 bpm  Recent Labs: 11/10/2019: B Natriuretic Peptide 418.4 11/11/2019: ALT 39; Magnesium 1.7; TSH 2.775 12/03/2019: BUN 17; Creatinine, Ser 1.43; Hemoglobin 17.2; Platelets 184; Potassium 4.4; Sodium 140  Recent Lipid Panel    Component Value Date/Time   CHOL 177 10/24/2018 0832   TRIG 122 10/24/2018 0832   HDL 63 10/24/2018 0832   CHOLHDL 2.8 10/24/2018 0832   VLDL 24 10/24/2018 0832   LDLCALC 90 10/24/2018 0832    Physical Exam:    VS:  BP 130/84   Pulse 85   Temp  (!) 97.1 F (36.2 C)   Ht 5\' 9"  (1.753 m)   Wt 232 lb (105.2 kg)   SpO2 92%   BMI 34.26 kg/m     Wt Readings from Last 3 Encounters:  12/05/19 232 lb (105.2 kg)  11/12/19 233 lb 7.5 oz (105.9 kg)  10/15/19 230 lb (104.3 kg)    GEN: Well nourished, well developed in no acute distress HEENT: Normal, moist mucous membranes NECK: No JVD CARDIAC: regular rhythm, normal S1 and S2, no rubs or gallops. No murmurs. VASCULAR: Radial and DP pulses 2+ bilaterally. No carotid bruits RESPIRATORY:  Clear to auscultation without rales, wheezing or rhonchi  ABDOMEN: Soft, non-tender, non-distended MUSCULOSKELETAL:  Ambulates independently SKIN: Warm and dry, no edema NEUROLOGIC:  Alert and oriented x 3. No focal neuro deficits noted. PSYCHIATRIC:  Normal affect    ASSESSMENT:    1. Chronic combined systolic and diastolic heart failure (HCC)   2. Essential hypertension   3. Encounter for education about heart failure   4. History of deep venous thrombosis (DVT) of distal vein of left lower extremity   5. NICM (nonischemic cardiomyopathy) (HCC)   6. Tobacco abuse counseling   7. Cardiac risk counseling   8. Counseling on health promotion and disease prevention    PLAN:    Chronic combined systolic and diastolic heart failure, nonischemic cardiomyopathy: -diagnosed 10/2018, recent admission 10/2019 -previously followed by Dr. Algie Coffer -echo, cath reviewed as above -appears euvolemic today -discussed heart failure education at length -continue management of risk factors, including hypertension, tobacco, and alcohol use -continue losartan 100 mg daily -was on metoprolol tartrate 25 mg BID, changing to carvedilol as below today  Hypertension: -goal <130/80, reports running higher than this typically -continue amlodipine 10 mg daily, losartan 100 mg daily, furosemide 40 mg BID -changing metoprolol tartrate 25 mg BID to carvedilol 12.5 mg BID -may uptitrate carvedilol at next visit  History  of DVT of lower extremity: diagnosed during hospitalization 10/2019 -was given 3 mos of apixaban, not told how long he should take it -we discussed general guidelines for DVT management. He will follow up with his PCP to discuss formal timing  Tobacco abuse counseling: The patient was counseled on tobacco cessation today for 4 minutes.  Counseling included reviewing the risks of smoking tobacco products, how it impacts the patient's current medical diagnoses and different strategies for quitting.  Pharmacotherapy to aid in tobacco cessation was not prescribed today.  Cardiac risk counseling and prevention recommendations: -recommend heart healthy/Mediterranean diet, with whole grains, fruits, vegetable, fish, lean meats, nuts, and olive oil. Limit salt. -recommend moderate walking, 3-5 times/week for  30-50 minutes each session. Aim for at least 150 minutes.week. Goal should be pace of 3 miles/hours, or walking 1.5 miles in 30 minutes -recommend avoidance of tobacco products. Avoid excess alcohol. -Additional risk factor control:  -Diabetes risk: A1c is not available, denies history  -Lipids: per [facility], Tchol 177, HFL 63, LDL 90, TG 122. Continue atorvastatin  -Blood pressure control: as above  -Weight: BMI 34, discussed lifestyle today -ASCVD risk score: The ASCVD Risk score Mikey Bussing DC Jr., et al., 2013) failed to calculate for the following reasons:   The patient has a prior MI or stroke diagnosis    Plan for follow up:  Buford Dresser, MD, PhD [place]  [facility]    Medication Adjustments/Labs and Tests Ordered: Current medicines are reviewed at length with the patient today.  Concerns regarding medicines are outlined above.  Orders Placed This Encounter  Procedures  . EKG 12-Lead   Meds ordered this encounter  Medications  . carvedilol (COREG) 12.5 MG tablet    Sig: Take 1 tablet (12.5 mg total) by mouth 2 (two) times daily.    Dispense:  180 tablet    Refill:  3     Patient Instructions  Medication Instructions:  Stop taking Metoprolol 25 mg twice a day Start Carvedilol 12.5 mg two a day  *If you need a refill on your cardiac medications before your next appointment, please call your pharmacy*   Lab Work: None  Testing/Procedures: None   Follow-Up: At [facility], you and your health needs are our priority.  As part of our continuing mission to provide you with exceptional heart care, we have created designated Provider Care Teams.  These Care Teams include your primary Cardiologist (physician) and Advanced Practice Providers (APPs -  Physician Assistants and Nurse Practitioners) who all work together to provide you with the care you need, when you need it.  We recommend signing up for the patient portal called "MyChart".  Sign up information is provided on this After Visit Summary.  MyChart is used to connect with patients for Virtual Visits (Telemedicine).  Patients are able to view lab/test results, encounter notes, upcoming appointments, etc.  Non-urgent messages can be sent to your provider as well.   To learn more about what you can do with MyChart, go to [email or web address].    Your next appointment:   3 month(s)  The format for your next appointment:   In Person  Provider:   Buford Dresser, MD     Do the following things EVERY DAY:  1) Weigh yourself EVERY morning after you go to the bathroom but before you eat or drink anything. Write this number down in a weight log/diary. If you gain 3 pounds overnight or 5 pounds in a week, call the office.  2) Take your medicines as prescribed. If you have concerns about your medications, please call us before you stop taking them.   3) Eat low salt foods--Limit salt (sodium) to 2000 mg per day. This will help prevent your body from holding onto fluid. Read food labels as many processed foods have a lot of sodium, especially canned goods and prepackaged meats. If you would  like some assistance choosing low sodium foods, we would be happy to set you up with a nutritionist.  4) Stay as active as you can everyday. Staying active will give you more energy and make your muscles stronger. Start with 5 minutes at a time and work your way up to 30 minutes a day.  Break up your activities--do some in the morning and some in the afternoon. Start with 3 days per week and work your way up to 5 days as you can.  If you have chest pain, feel short of breath, dizzy, or lightheaded, STOP. If you don't feel better after a short rest, call 911. If you do feel better, call the office to let us know you have symptoms with exercise.  5) Limit all fluids for the day to less than 2 liters. Fluid includes all drinks, coffee, juice, ice chips, soup, jello, and all other liquids.    Signed, Jesus Red, MD PhD 12/05/2019  [facility]

## 2019-12-09 ENCOUNTER — Other Ambulatory Visit: Payer: Self-pay | Admitting: Internal Medicine

## 2019-12-09 DIAGNOSIS — R739 Hyperglycemia, unspecified: Secondary | ICD-10-CM

## 2019-12-09 DIAGNOSIS — R7989 Other specified abnormal findings of blood chemistry: Secondary | ICD-10-CM

## 2019-12-12 MED FILL — LOSARTAN POTASSIUM 50 MG TA: 50 | 30 days supply | Qty: 60 | Fill #1 | Status: TO

## 2019-12-12 MED FILL — FUROSEMIDE 40 MG TAB: 40 | 15 days supply | Qty: 30 | Fill #2 | Status: TO

## 2019-12-12 MED FILL — POTASSIUM CHLORIDE ER 10 ME: 10 | 30 days supply | Qty: 60 | Fill #1 | Status: TO

## 2019-12-12 MED FILL — AMLODIPINE BESYLATE 10 MG T: 10 | 30 days supply | Qty: 30 | Fill #1 | Status: TO

## 2019-12-12 MED FILL — ELIQUIS 5 MG TABLET: 5 | 30 days supply | Qty: 60 | Fill #2 | Status: TO

## 2020-01-17 MED FILL — CARVEDILOL 12.5 MG TABLET: 12.5 | 30 days supply | Qty: 60 | Fill #0

## 2020-01-17 MED FILL — AMLODIPINE BESYLATE 10 MG T: 10 | 30 days supply | Qty: 30 | Fill #2 | Status: TO

## 2020-01-17 MED FILL — FUROSEMIDE 40 MG TAB: 40 | 15 days supply | Qty: 30 | Fill #3 | Status: TO

## 2020-02-02 ENCOUNTER — Emergency Department (HOSPITAL_COMMUNITY): Payer: Self-pay

## 2020-02-02 ENCOUNTER — Encounter (HOSPITAL_COMMUNITY): Payer: Self-pay | Admitting: Internal Medicine

## 2020-02-02 ENCOUNTER — Inpatient Hospital Stay (HOSPITAL_COMMUNITY)
Admission: EM | Admit: 2020-02-02 | Discharge: 2020-02-04 | DRG: 202 | Disposition: A | Discharge status: Home / Self Care | Payer: Self-pay | Attending: Internal Medicine | Admitting: Internal Medicine

## 2020-02-02 ENCOUNTER — Other Ambulatory Visit: Payer: Self-pay

## 2020-02-02 DIAGNOSIS — Z825 Family history of asthma and other chronic lower respiratory diseases: Secondary | ICD-10-CM

## 2020-02-02 DIAGNOSIS — I700 Atherosclerosis of aorta: Secondary | ICD-10-CM | POA: Diagnosis present

## 2020-02-02 DIAGNOSIS — W19XXXA Unspecified fall, initial encounter: Secondary | ICD-10-CM | POA: Diagnosis present

## 2020-02-02 DIAGNOSIS — Z7289 Other problems related to lifestyle: Secondary | ICD-10-CM

## 2020-02-02 DIAGNOSIS — Z833 Family history of diabetes mellitus: Secondary | ICD-10-CM

## 2020-02-02 DIAGNOSIS — J449 Chronic obstructive pulmonary disease, unspecified: Secondary | ICD-10-CM

## 2020-02-02 DIAGNOSIS — J4541 Moderate persistent asthma with (acute) exacerbation: Secondary | ICD-10-CM

## 2020-02-02 DIAGNOSIS — J209 Acute bronchitis, unspecified: Principal | ICD-10-CM | POA: Diagnosis present

## 2020-02-02 DIAGNOSIS — Z86718 Personal history of other venous thrombosis and embolism: Secondary | ICD-10-CM

## 2020-02-02 DIAGNOSIS — I10 Essential (primary) hypertension: Secondary | ICD-10-CM

## 2020-02-02 DIAGNOSIS — R490 Dysphonia: Secondary | ICD-10-CM | POA: Diagnosis present

## 2020-02-02 DIAGNOSIS — Z79899 Other long term (current) drug therapy: Secondary | ICD-10-CM

## 2020-02-02 DIAGNOSIS — Z7901 Long term (current) use of anticoagulants: Secondary | ICD-10-CM

## 2020-02-02 DIAGNOSIS — E871 Hypo-osmolality and hyponatremia: Secondary | ICD-10-CM | POA: Diagnosis present

## 2020-02-02 DIAGNOSIS — J45901 Unspecified asthma with (acute) exacerbation: Secondary | ICD-10-CM | POA: Diagnosis present

## 2020-02-02 DIAGNOSIS — I255 Ischemic cardiomyopathy: Secondary | ICD-10-CM | POA: Diagnosis present

## 2020-02-02 DIAGNOSIS — J45909 Unspecified asthma, uncomplicated: Secondary | ICD-10-CM | POA: Diagnosis present

## 2020-02-02 DIAGNOSIS — I5042 Chronic combined systolic (congestive) and diastolic (congestive) heart failure: Secondary | ICD-10-CM | POA: Diagnosis present

## 2020-02-02 DIAGNOSIS — I428 Other cardiomyopathies: Secondary | ICD-10-CM | POA: Diagnosis present

## 2020-02-02 DIAGNOSIS — R55 Syncope and collapse: Secondary | ICD-10-CM | POA: Diagnosis present

## 2020-02-02 DIAGNOSIS — F17210 Nicotine dependence, cigarettes, uncomplicated: Secondary | ICD-10-CM | POA: Diagnosis present

## 2020-02-02 DIAGNOSIS — J440 Chronic obstructive pulmonary disease with acute lower respiratory infection: Secondary | ICD-10-CM | POA: Diagnosis present

## 2020-02-02 DIAGNOSIS — R0902 Hypoxemia: Secondary | ICD-10-CM | POA: Diagnosis present

## 2020-02-02 DIAGNOSIS — S42031A Displaced fracture of lateral end of right clavicle, initial encounter for closed fracture: Secondary | ICD-10-CM | POA: Diagnosis present

## 2020-02-02 DIAGNOSIS — Z20822 Contact with and (suspected) exposure to covid-19: Secondary | ICD-10-CM | POA: Diagnosis present

## 2020-02-02 DIAGNOSIS — K760 Fatty (change of) liver, not elsewhere classified: Secondary | ICD-10-CM | POA: Diagnosis present

## 2020-02-02 DIAGNOSIS — F1020 Alcohol dependence, uncomplicated: Secondary | ICD-10-CM | POA: Diagnosis present

## 2020-02-02 DIAGNOSIS — I110 Hypertensive heart disease with heart failure: Secondary | ICD-10-CM | POA: Diagnosis present

## 2020-02-02 HISTORY — DX: Fatty (change of) liver, not elsewhere classified: K76.0

## 2020-02-02 HISTORY — DX: Tobacco use: Z72.0

## 2020-02-02 HISTORY — DX: Chronic combined systolic (congestive) and diastolic (congestive) heart failure: I50.42

## 2020-02-02 HISTORY — DX: Other cardiomyopathies: I42.8

## 2020-02-02 HISTORY — DX: Reserved for inherently not codable concepts without codable children: IMO0001

## 2020-02-02 HISTORY — DX: Atherosclerosis of aorta: I70.0

## 2020-02-02 LAB — CBC WITH DIFFERENTIAL/PLATELET
Abs Immature Granulocytes: 0.08 10*3/uL — ABNORMAL HIGH (ref 0.00–0.07)
Basophils Absolute: 0 10*3/uL (ref 0.0–0.1)
Basophils Relative: 0 %
Eosinophils Absolute: 0.1 10*3/uL (ref 0.0–0.5)
Eosinophils Relative: 1 %
HCT: 39.3 % (ref 39.0–52.0)
Hemoglobin: 13.2 g/dL (ref 13.0–17.0)
Immature Granulocytes: 1 %
Lymphocytes Relative: 13 %
Lymphs Abs: 1.2 10*3/uL (ref 0.7–4.0)
MCH: 35.2 pg — ABNORMAL HIGH (ref 26.0–34.0)
MCHC: 33.6 g/dL (ref 30.0–36.0)
MCV: 104.8 fL — ABNORMAL HIGH (ref 80.0–100.0)
Monocytes Absolute: 0.7 10*3/uL (ref 0.1–1.0)
Monocytes Relative: 8 %
Neutro Abs: 7.2 10*3/uL (ref 1.7–7.7)
Neutrophils Relative %: 77 %
Platelets: 232 10*3/uL (ref 150–400)
RBC: 3.75 MIL/uL — ABNORMAL LOW (ref 4.22–5.81)
RDW: 14.3 % (ref 11.5–15.5)
WBC: 9.3 10*3/uL (ref 4.0–10.5)
nRBC: 0 % (ref 0.0–0.2)

## 2020-02-02 LAB — TROPONIN I (HIGH SENSITIVITY)
Troponin I (High Sensitivity): 4 ng/L (ref <18)
Troponin I (High Sensitivity): 4 ng/L (ref <18)

## 2020-02-02 LAB — BRAIN NATRIURETIC PEPTIDE: B Natriuretic Peptide: 30.5 pg/mL (ref 0.0–100.0)

## 2020-02-02 LAB — BLOOD GAS, VENOUS
Acid-Base Excess: 2.1 mmol/L — ABNORMAL HIGH (ref 0.0–2.0)
Bicarbonate: 27.7 mmol/L (ref 20.0–28.0)
O2 Saturation: 53 %
Patient temperature: 98.6
pCO2, Ven: 50.1 mmHg (ref 44.0–60.0)
pH, Ven: 7.361 (ref 7.250–7.430)
pO2, Ven: 30.5 mmHg — CL (ref 32.0–45.0)

## 2020-02-02 LAB — COMPREHENSIVE METABOLIC PANEL
ALT: 16 U/L (ref 0–44)
AST: 17 U/L (ref 15–41)
Albumin: 4.3 g/dL (ref 3.5–5.0)
Alkaline Phosphatase: 93 U/L (ref 38–126)
Anion gap: 10 (ref 5–15)
BUN: 31 mg/dL — ABNORMAL HIGH (ref 6–20)
CO2: 27 mmol/L (ref 22–32)
Calcium: 9.4 mg/dL (ref 8.9–10.3)
Chloride: 94 mmol/L — ABNORMAL LOW (ref 98–111)
Creatinine, Ser: 1.05 mg/dL (ref 0.61–1.24)
GFR calc Af Amer: >60 mL/min (ref >60)
GFR calc non Af Amer: >60 mL/min (ref >60)
Glucose, Bld: 120 mg/dL — ABNORMAL HIGH (ref 70–99)
Potassium: 4.5 mmol/L (ref 3.5–5.1)
Sodium: 131 mmol/L — ABNORMAL LOW (ref 135–145)
Total Bilirubin: 0.7 mg/dL (ref 0.3–1.2)
Total Protein: 8.4 g/dL — ABNORMAL HIGH (ref 6.5–8.1)

## 2020-02-02 LAB — SARS CORONAVIRUS 2 BY RT PCR (HOSPITAL ORDER, PERFORMED IN ~~LOC~~ HOSPITAL LAB): SARS Coronavirus 2: NEGATIVE

## 2020-02-02 MED ORDER — PANTOPRAZOLE SODIUM 40 MG PO TBEC
40.0000 mg | DELAYED_RELEASE_TABLET | Freq: Every day | ORAL | Status: DC
  Administered 2020-02-03 – 2020-02-04 (×2): 40 mg via ORAL
  Filled 2020-02-02 (×2): qty 1

## 2020-02-02 MED ORDER — LORAZEPAM 1 MG PO TABS: 1.0000 mg | ORAL_TABLET | ORAL | Status: DC | PRN

## 2020-02-02 MED ORDER — LORAZEPAM 2 MG/ML IJ SOLN: 1.0000 mg | INTRAMUSCULAR | Status: DC | PRN

## 2020-02-02 MED ORDER — ASPIRIN EC 325 MG PO TBEC
325.0000 mg | DELAYED_RELEASE_TABLET | Freq: Every day | ORAL | Status: DC
  Administered 2020-02-03 – 2020-02-04 (×2): 325 mg via ORAL
  Filled 2020-02-02 (×2): qty 1

## 2020-02-02 MED ORDER — POTASSIUM CHLORIDE ER 10 MEQ PO TBCR
10.0000 meq | EXTENDED_RELEASE_TABLET | Freq: Two times a day (BID) | ORAL | Status: DC
  Administered 2020-02-02 – 2020-02-03 (×3): 10 meq via ORAL
  Filled 2020-02-02 (×8): qty 1

## 2020-02-02 MED ORDER — THIAMINE HCL 100 MG PO TABS
100.0000 mg | ORAL_TABLET | Freq: Every day | ORAL | Status: DC
  Administered 2020-02-02 – 2020-02-04 (×3): 100 mg via ORAL
  Filled 2020-02-02 (×3): qty 1

## 2020-02-02 MED ORDER — METHYLPREDNISOLONE SODIUM SUCC 125 MG IJ SOLR
125.0000 mg | Freq: Once | INTRAMUSCULAR | Status: AC
  Administered 2020-02-02: 125 mg via INTRAVENOUS
  Filled 2020-02-02: qty 2

## 2020-02-02 MED ORDER — CARVEDILOL 12.5 MG PO TABS
12.5000 mg | ORAL_TABLET | Freq: Two times a day (BID) | ORAL | Status: DC
  Administered 2020-02-02 – 2020-02-04 (×4): 12.5 mg via ORAL
  Filled 2020-02-02 (×4): qty 1

## 2020-02-02 MED ORDER — AMLODIPINE BESYLATE 10 MG PO TABS
10.0000 mg | ORAL_TABLET | Freq: Every day | ORAL | Status: DC
  Administered 2020-02-03 – 2020-02-04 (×2): 10 mg via ORAL
  Filled 2020-02-02 (×2): qty 1

## 2020-02-02 MED ORDER — ALBUTEROL SULFATE (2.5 MG/3ML) 0.083% IN NEBU
2.5000 mg | INHALATION_SOLUTION | RESPIRATORY_TRACT | Status: DC
  Administered 2020-02-03: 2.5 mg via RESPIRATORY_TRACT
  Filled 2020-02-02: qty 3

## 2020-02-02 MED ORDER — ONDANSETRON HCL 4 MG/2ML IJ SOLN: 4.0000 mg | Freq: Four times a day (QID) | INTRAMUSCULAR | Status: DC | PRN

## 2020-02-02 MED ORDER — APIXABAN 5 MG PO TABS
5.0000 mg | ORAL_TABLET | Freq: Two times a day (BID) | ORAL | Status: DC
  Administered 2020-02-02 – 2020-02-04 (×4): 5 mg via ORAL
  Filled 2020-02-02 (×4): qty 1

## 2020-02-02 MED ORDER — FOLIC ACID 1 MG PO TABS
1.0000 mg | ORAL_TABLET | Freq: Every day | ORAL | Status: DC
  Administered 2020-02-02 – 2020-02-04 (×3): 1 mg via ORAL
  Filled 2020-02-02 (×3): qty 1

## 2020-02-02 MED ORDER — LOSARTAN POTASSIUM 50 MG PO TABS
100.0000 mg | ORAL_TABLET | Freq: Every day | ORAL | Status: DC
  Administered 2020-02-03 – 2020-02-04 (×2): 100 mg via ORAL
  Filled 2020-02-02 (×2): qty 2

## 2020-02-02 MED ORDER — ALBUTEROL SULFATE HFA 108 (90 BASE) MCG/ACT IN AERS
2.0000 | INHALATION_SPRAY | Freq: Once | RESPIRATORY_TRACT | Status: AC
  Administered 2020-02-02: 2 via RESPIRATORY_TRACT
  Filled 2020-02-02: qty 6.7

## 2020-02-02 MED ORDER — BUDESONIDE 0.25 MG/2ML IN SUSP
0.2500 mg | Freq: Two times a day (BID) | RESPIRATORY_TRACT | Status: DC
  Administered 2020-02-03 – 2020-02-04 (×3): 0.25 mg via RESPIRATORY_TRACT
  Filled 2020-02-02 (×3): qty 2

## 2020-02-02 MED ORDER — MORPHINE SULFATE (PF) 4 MG/ML IV SOLN
4.0000 mg | Freq: Once | INTRAVENOUS | Status: AC
  Administered 2020-02-02: 4 mg via INTRAVENOUS
  Filled 2020-02-02: qty 1

## 2020-02-02 MED ORDER — IPRATROPIUM BROMIDE 0.02 % IN SOLN
0.5000 mg | RESPIRATORY_TRACT | Status: DC
  Administered 2020-02-03: 0.5 mg via RESPIRATORY_TRACT
  Filled 2020-02-02: qty 2.5

## 2020-02-02 MED ORDER — FUROSEMIDE 40 MG PO TABS
40.0000 mg | ORAL_TABLET | Freq: Two times a day (BID) | ORAL | Status: DC
  Administered 2020-02-03 – 2020-02-04 (×3): 40 mg via ORAL
  Filled 2020-02-02 (×3): qty 1

## 2020-02-02 MED ORDER — ADULT MULTIVITAMIN W/MINERALS CH
1.0000 | ORAL_TABLET | Freq: Every day | ORAL | Status: DC
  Administered 2020-02-02 – 2020-02-04 (×3): 1 via ORAL
  Filled 2020-02-02 (×3): qty 1

## 2020-02-02 MED ORDER — OMEPRAZOLE MAGNESIUM 20 MG PO TBEC: 20.0000 mg | DELAYED_RELEASE_TABLET | Freq: Every day | ORAL | Status: DC

## 2020-02-02 MED ORDER — METHYLPREDNISOLONE SODIUM SUCC 125 MG IJ SOLR
40.0000 mg | Freq: Two times a day (BID) | INTRAMUSCULAR | Status: DC
  Administered 2020-02-03 – 2020-02-04 (×3): 40 mg via INTRAVENOUS
  Filled 2020-02-02 (×3): qty 2

## 2020-02-02 MED ORDER — THIAMINE HCL 100 MG/ML IJ SOLN
100.0000 mg | Freq: Every day | INTRAMUSCULAR | Status: DC
  Filled 2020-02-02: qty 2

## 2020-02-02 MED ORDER — ONDANSETRON HCL 4 MG PO TABS: 4.0000 mg | ORAL_TABLET | Freq: Four times a day (QID) | ORAL | Status: DC | PRN

## 2020-02-02 MED ORDER — FAMOTIDINE 20 MG PO TABS: 20.0000 mg | ORAL_TABLET | Freq: Every day | ORAL | Status: DC | PRN

## 2020-02-02 MED ORDER — FUROSEMIDE 10 MG/ML IJ SOLN
40.0000 mg | Freq: Once | INTRAMUSCULAR | Status: AC
  Administered 2020-02-02: 40 mg via INTRAVENOUS
  Filled 2020-02-02: qty 4

## 2020-02-02 MED ORDER — ALBUTEROL SULFATE (2.5 MG/3ML) 0.083% IN NEBU: 2.5000 mg | INHALATION_SOLUTION | RESPIRATORY_TRACT | Status: DC | PRN

## 2020-02-02 NOTE — Progress Notes (Signed)
Orthopedic Tech Progress Note Patient Details:  Jesus Fuller 14-Mar-1976 [id number]  Ortho Devices Type of Ortho Device: Shoulder immobilizer Ortho Device/Splint Location: right Ortho Device/Splint Interventions: Application   Post Interventions Patient Tolerated: Well Instructions Provided: Care of device   Saul Fordyce 02/02/2020, 4:54 PM

## 2020-02-02 NOTE — ED Notes (Signed)
ED TO INPATIENT HANDOFF REPORT  ED Nurse Name and Phone #: Leta Jungling [phone]  S Name/Age/Gender Jesus Fuller 44 y.o. male Room/Bed: WA13/WA13  Code Status   Code Status: Prior  Home/SNF/Other Home Patient oriented to: self, place, time and situation Is this baseline? Yes   Triage Complete: Triage complete  Chief Complaint Asthma exacerbation [J45.901]  Triage Note Patient reports severe coughing episodes starting 1 week ago, patient states he is coughing so hard he passed out in the bathroom in triage (unwitnessed by staff) - patient reports he hurt right shoulder during syncopal episode. Pain rated 10/10     Allergies No Known Allergies  Level of Care/Admitting Diagnosis ED Disposition    ED Disposition Condition Comment   Admit  Hospital Area: [facility] [[id number]]  Level of Care: Telemetry [5]  Admit to tele based on following criteria: Eval of Syncope  Covid Evaluation: Asymptomatic Screening Protocol (No Symptoms)  Diagnosis: Asthma exacerbation [193790]  Admitting Physician: Eduard Clos [phone]  Attending Physician: Eduard Clos Florian.Pax       B Medical/Surgery History Past Medical History:  Diagnosis Date  . Asthma   . Hypertension    Takes no medicine   Past Surgical History:  Procedure Laterality Date  . RIGHT/LEFT HEART CATH AND CORONARY ANGIOGRAPHY N/A 10/24/2018   Procedure: RIGHT/LEFT HEART CATH AND CORONARY ANGIOGRAPHY;  Surgeon: Orpah Cobb, MD;  Location: [facility];  Service: Cardiovascular;  Laterality: N/A;     A IV Location/Drains/Wounds Patient Lines/Drains/Airways Status    Active Line/Drains/Airways    Name Placement date Placement time Site Days   Peripheral IV 02/02/20 Left Antecubital 02/02/20  1729  Antecubital  less than 1          Intake/Output Last 24 hours No intake or output data in the 24 hours ending 02/02/20 1942  Labs/Imaging Results for orders placed or performed during  the hospital encounter of 02/02/20 (from the past 48 hour(s))  CBC with Differential/Platelet     Status: Abnormal   Collection Time: 02/02/20  5:31 PM  Result Value Ref Range   WBC 9.3 4.0 - 10.5 K/uL   RBC 3.75 (L) 4.22 - 5.81 MIL/uL   Hemoglobin 13.2 13.0 - 17.0 g/dL   HCT 73.5 39 - 52 %   MCV 104.8 (H) 80.0 - 100.0 fL   MCH 35.2 (H) 26.0 - 34.0 pg   MCHC 33.6 30.0 - 36.0 g/dL   RDW 32.9 92.4 - 26.8 %   Platelets 232 150 - 400 K/uL   nRBC 0.0 0.0 - 0.2 %   Neutrophils Relative % 77 %   Neutro Abs 7.2 1.7 - 7.7 K/uL   Lymphocytes Relative 13 %   Lymphs Abs 1.2 0.7 - 4.0 K/uL   Monocytes Relative 8 %   Monocytes Absolute 0.7 0 - 1 K/uL   Eosinophils Relative 1 %   Eosinophils Absolute 0.1 0 - 0 K/uL   Basophils Relative 0 %   Basophils Absolute 0.0 0 - 0 K/uL   Immature Granulocytes 1 %   Abs Immature Granulocytes 0.08 (H) 0.00 - 0.07 K/uL    Comment: Performed at [facility], [address] [place], [place] [address]  Comprehensive metabolic panel     Status: Abnormal   Collection Time: 02/02/20  5:31 PM  Result Value Ref Range   Sodium 131 (L) 135 - 145 mmol/L   Potassium 4.5 3.5 - 5.1 mmol/L   Chloride 94 (L)  98 - 111 mmol/L   CO2 27 22 - 32 mmol/L   Glucose, Bld 120 (H) 70 - 99 mg/dL    Comment: Glucose reference range applies only to samples taken after fasting for at least 8 hours.   BUN 31 (H) 6 - 20 mg/dL   Creatinine, Ser 2.20 0.61 - 1.24 mg/dL   Calcium 9.4 8.9 - 25.4 mg/dL   Total Protein 8.4 (H) 6.5 - 8.1 g/dL   Albumin 4.3 3.5 - 5.0 g/dL   AST 17 15 - 41 U/L   ALT 16 0 - 44 U/L   Alkaline Phosphatase 93 38 - 126 U/L   Total Bilirubin 0.7 0.3 - 1.2 mg/dL   GFR calc non Af Amer >60 >60 mL/min   GFR calc Af Amer >60 >60 mL/min   Anion gap 10 5 - 15    Comment: Performed at [facility], [address] [place], [place] [address]  Troponin I (High Sensitivity)     Status: None   Collection Time: 02/02/20  5:31 PM   Result Value Ref Range   Troponin I (High Sensitivity) 4 <18 ng/L    Comment: (NOTE) Elevated high sensitivity troponin I (hsTnI) values and significant  changes across serial measurements may suggest ACS but many other  chronic and acute conditions are known to elevate hsTnI results.  Refer to the "Links" section for chest pain algorithms and additional  guidance. Performed at [facility], [address] [place], [place] [address]   Brain natriuretic peptide     Status: None   Collection Time: 02/02/20  5:32 PM  Result Value Ref Range   B Natriuretic Peptide 30.5 0.0 - 100.0 pg/mL    Comment: Performed at [facility], [address] [place], [place] [address]  Blood gas, venous     Status: Abnormal   Collection Time: 02/02/20  5:32 PM  Result Value Ref Range   pH, Ven 7.361 7.25 - 7.43   pCO2, Ven 50.1 44 - 60 mmHg   pO2, Ven 30.5 (LL) 32 - 45 mmHg    Comment: CRITICAL RESULT CALLED TO, READ BACK BY AND VERIFIED WITH: WILLIAM,F PA @1812  ON 02/02/20 JACKSON,K    Bicarbonate 27.7 20.0 - 28.0 mmol/L   Acid-Base Excess 2.1 (H) 0.0 - 2.0 mmol/L   O2 Saturation 53.0 %   Patient temperature 98.6     Comment: Performed at [facility], [address], [place], [place]   DG Shoulder Right  Result Date: 02/02/2020 CLINICAL DATA:  Right shoulder pain after a fall today. Initial encounter. EXAM: RIGHT SHOULDER - 2+ VIEW COMPARISON:  None. FINDINGS: The patient has an acute, fracture of the distal clavicle with mild superior displacement of the distal fragment. The [facility] joint is intact. The humeral head is located. IMPRESSION: Acute fracture of the distal right clavicle. Electronically Signed   By: [facility] M.D.   On: 02/02/2020 12:55   DG Chest Port 1 View  Result Date: 02/02/2020 CLINICAL DATA:  Severe coughing episodes for the past week. EXAM: PORTABLE CHEST 1 VIEW COMPARISON:  11/10/2019 FINDINGS: The heart  size and mediastinal contours are within normal limits. Both lungs are clear. The visualized skeletal structures are unremarkable. IMPRESSION: Normal examination. Electronically Signed   By: 11/12/2019 M.D.   On: 02/02/2020 17:17   DG Humerus Right  Result Date: 02/02/2020 CLINICAL DATA:  Right upper arm pain after a fall today. Initial encounter.  EXAM: RIGHT HUMERUS - 2+ VIEW COMPARISON:  None. FINDINGS: Acute fracture of the distal clavicle is noted as seen on dedicated plain films of the shoulder. No other abnormality is identified. IMPRESSION: Acute fracture of the distal clavicle.  Otherwise negative. Electronically Signed   By: Drusilla Kanner M.D.   On: 02/02/2020 12:57    Pending Labs Unresulted Labs (From admission, onward) Comment          Start     Ordered   02/02/20 1653  SARS Coronavirus 2 by RT PCR (hospital order, performed in [facility] hospital lab) Nasopharyngeal Nasopharyngeal Swab  (Tier 2 (TAT 2 hrs))  Once,   STAT       Question Answer Comment  Is this test for diagnosis or screening Screening   Symptomatic for COVID-19 as defined by CDC No   Hospitalized for COVID-19 No   Admitted to ICU for COVID-19 No   Previously tested for COVID-19 Yes   Resident in a congregate (group) care setting No   Employed in healthcare setting No   Has patient completed COVID vaccination(s) (2 doses of Pfizer/Moderna 1 dose of [employer]) No      02/02/20 1652          Vitals/Pain Today's Vitals   02/02/20 1744 02/02/20 1752 02/02/20 1759 02/02/20 1800  BP:   118/66   Pulse: 92 98 75   Resp: 20 (!) 22 (!) 29   Temp:      TempSrc:      SpO2: 94% 93% 100%   Weight:      Height:      PainSc:    2     Isolation Precautions No active isolations  Medications Medications  morphine 4 MG/ML injection 4 mg (4 mg Intravenous Given 02/02/20 1730)  albuterol (VENTOLIN HFA) 108 (90 Base) MCG/ACT inhaler 2 puff (2 puffs Inhalation Given 02/02/20 1731)  methylPREDNISolone  sodium succinate (SOLU-MEDROL) 125 mg/2 mL injection 125 mg (125 mg Intravenous Given 02/02/20 1730)    Mobility walks Low fall risk      R Recommendations: See Admitting Provider Note  Report given to:   Additional Notes: none

## 2020-02-02 NOTE — ED Provider Notes (Signed)
[facility]-EMERGENCY DEPT Provider Note   CSN: [id number] Arrival date & time: 02/02/20  1147     History Chief Complaint  Patient presents with  . Cough  . Loss of Consciousness    Jesus Fuller is a 44 y.o. male hx of asthma, hypertension, here presenting with shortness of breath, cough, syncope.  Patient states that for the last 2 weeks or so, he has been coughing.  He states that his coughing spells is worse in the morning.  He states that he is unable to cough up anything.  He states that sometimes when he coughs, he would pass out.  He states that he passed out multiple times and did hit his head.  Today while in triage, he did pass out and hit the right shoulder.  He states that he has right shoulder pain afterwards.  Patient was recently admitted for heart failure.  He states that his weight has improved and denies any orthopnea.  Patient does have DVT and is compliant with his Eliquis.   The history is provided by the patient.       Past Medical History:  Diagnosis Date  . Asthma   . Hypertension    Takes no medicine    Patient Active Problem List   Diagnosis Date Noted  . 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 Chronic combined systolic and diastolic heart failure (HCC) 12/05/2019  . Essential hypertension 12/02/2019  . Chronic congestive heart failure (HCC) 11/10/2019  . Tobacco abuse 11/10/2019  . Acute coronary syndrome (HCC) 10/23/2018    Past Surgical History:  Procedure Laterality Date  . RIGHT/LEFT HEART CATH AND CORONARY ANGIOGRAPHY N/A 10/24/2018   Procedure: RIGHT/LEFT HEART CATH AND CORONARY ANGIOGRAPHY;  Surgeon: Orpah Cobb, MD;  Location: [facility] INVASIVE CV LAB;  Service: Cardiovascular;  Laterality: N/A;       Family History  Problem Relation Age of Onset  . Cancer Mother   . COPD Mother   . Diabetes Father     Social History   Tobacco Use  . Smoking status: Current  Every Day Smoker    Packs/day: 1.50    Types: Cigarettes  . Smokeless tobacco: Never Used  Vaping Use  . Vaping Use: Never used  Substance Use Topics  . Alcohol use: Yes    Comment: occasional  . Drug use: No    Home Medications Prior to Admission medications   Medication Sig Start Date End Date Taking? Authorizing Provider  amLODipine (NORVASC) 10 MG tablet Take 1 tablet (10 mg total) by mouth daily. 11/13/19   Alwyn Ren, MD  apixaban (ELIQUIS) 5 MG TABS tablet Take 1 tablet (5 mg total) by mouth 2 (two) times daily. 10/15/19   Arvilla Market, DO  carvedilol (COREG) 12.5 MG tablet Take 1 tablet (12.5 mg total) by mouth 2 (two) times daily. 12/05/19 03/04/20  Jodelle Red, MD  furosemide (LASIX) 40 MG tablet Take 1 tablet (40 mg total) by mouth 2 (two) times daily. 11/12/19 12/12/19  Alwyn Ren, MD  losartan (COZAAR) 50 MG tablet Take 2 tablets (100 mg total) by mouth daily. 11/13/19   Alwyn Ren, MD  nicotine (NICODERM CQ - DOSED IN MG/24 HOURS) 21 mg/24hr patch Place 1 patch (21 mg total) onto the skin daily. 11/13/19   Alwyn Ren, MD  potassium chloride (KLOR-CON) 10 MEQ tablet Take 1 tablet (10 mEq total) by mouth 2 (two) times  daily. 11/12/19   Alwyn Ren, MD  atorvastatin (LIPITOR) 40 MG tablet Take 1 tablet (40 mg total) by mouth daily at 6 PM. Patient not taking: Reported on 03/27/2019 10/25/18 03/27/19  Orpah Cobb, MD    Allergies    Patient has no known allergies.  Review of Systems   Review of Systems  Respiratory: Positive for cough.   Cardiovascular: Positive for syncope.  All other systems reviewed and are negative.   Physical Exam Updated Vital Signs BP 135/82 (BP Location: Left Arm)   Pulse (!) 102   Temp 98.6 F (37 C) (Oral)   Resp 16   Ht 5\' 9"  (1.753 m)   Wt 95.3 kg   SpO2 95%   BMI 31.01 kg/m   Physical Exam Vitals and nursing note reviewed.  Constitutional:      Appearance: Normal  appearance.     Comments: Coughing   HENT:     Head: Normocephalic.     Nose: Nose normal.     Mouth/Throat:     Mouth: Mucous membranes are moist.  Eyes:     Extraocular Movements: Extraocular movements intact.     Pupils: Pupils are equal, round, and reactive to light.  Cardiovascular:     Rate and Rhythm: Normal rate and regular rhythm.     Pulses: Normal pulses.     Heart sounds: Normal heart sounds.  Pulmonary:     Comments: Tachypneic mild diffuse wheezing, moderate distress. Abdominal:     General: Abdomen is flat.     Palpations: Abdomen is soft.  Musculoskeletal:     Cervical back: Normal range of motion and neck supple.     Comments: Deformity to the distal end of the right clavicle.  Unable to move the right shoulder.  Neurovascular intact in the right upper extremity.   Skin:    General: Skin is warm.     Capillary Refill: Capillary refill takes less than 2 seconds.  Neurological:     General: No focal deficit present.     Mental Status: He is alert and oriented to person, place, and time.  Psychiatric:        Mood and Affect: Mood normal.        Behavior: Behavior normal.     ED Results / Procedures / Treatments   Labs (all labs ordered are listed, but only abnormal results are displayed) Labs Reviewed  SARS CORONAVIRUS 2 BY RT PCR (HOSPITAL ORDER, PERFORMED IN [facility])  CBC WITH DIFFERENTIAL/PLATELET  COMPREHENSIVE METABOLIC PANEL  BRAIN NATRIURETIC PEPTIDE  TROPONIN I (HIGH SENSITIVITY)    EKG None  Radiology DG Shoulder Right  Result Date: 02/02/2020 CLINICAL DATA:  Right shoulder pain after a fall today. Initial encounter. EXAM: RIGHT SHOULDER - 2+ VIEW COMPARISON:  None. FINDINGS: The patient has an acute, fracture of the distal clavicle with mild superior displacement of the distal fragment. The [facility] joint is intact. The humeral head is located. IMPRESSION: Acute fracture of the distal right clavicle. Electronically Signed   By:  [facility] M.D.   On: 02/02/2020 12:55   DG Humerus Right  Result Date: 02/02/2020 CLINICAL DATA:  Right upper arm pain after a fall today. Initial encounter. EXAM: RIGHT HUMERUS - 2+ VIEW COMPARISON:  None. FINDINGS: Acute fracture of the distal clavicle is noted as seen on dedicated plain films of the shoulder. No other abnormality is identified. IMPRESSION: Acute fracture of the distal clavicle.  Otherwise negative. Electronically Signed  By: Drusilla Kanner M.D.   On: 02/02/2020 12:57    Procedures Procedures (including critical care time)  CRITICAL CARE Performed by: Richardean Canal   Total critical care time: 30 minutes  Critical care time was exclusive of separately billable procedures and treating other patients.  Critical care was necessary to treat or prevent imminent or life-threatening deterioration.  Critical care was time spent personally by me on the following activities: development of treatment plan with patient and/or surrogate as well as nursing, discussions with consultants, evaluation of patient's response to treatment, examination of patient, obtaining history from patient or surrogate, ordering and performing treatments and interventions, ordering and review of laboratory studies, ordering and review of radiographic studies, pulse oximetry and re-evaluation of patient's condition.   Medications Ordered in ED Medications  morphine 4 MG/ML injection 4 mg (has no administration in time range)  albuterol (VENTOLIN HFA) 108 (90 Base) MCG/ACT inhaler 2 puff (has no administration in time range)  methylPREDNISolone sodium succinate (SOLU-MEDROL) 125 mg/2 mL injection 125 mg (has no administration in time range)    ED Course  I have reviewed the triage vital signs and the nursing notes.  Pertinent labs & imaging results that were available during my care of the patient were reviewed by me and considered in my medical decision making (see chart for details).     MDM Rules/Calculators/A&P                          Lauri ARK AGRUSA is a 44 y.o. male here presenting with cough and syncope and right shoulder pain.  Likely asthma exacerbation.  He was recently admitted for heart failure so consider heart failure as well.  Patient is already anticoagulated on Eliquis and I doubt PE.  Plan to get CBC, CMP, troponin, BNP, chest x-ray and right shoulder x-ray.  Will give some pain medicine and Solu-Medrol and albuterol.  7:20 PM Patient's PO2 is 30 but pH is normal.  Patient sats dropped to 89 to 90% on room air after an albuterol.  Patient has still diffusely wheezing.  Covid is pending.  Chest x-ray is clear but he does have distal clavicle fracture.  Shoulder immobilizer placed and given pain medicine.  Hospitalist to admit for asthma exacerbation with hypoxia.  Final Clinical Impression(s) / ED Diagnoses Final diagnoses:  None    Rx / DC Orders ED Discharge Orders    None       Charlynne Pander, MD 02/02/20 Jerene Bears

## 2020-02-02 NOTE — ED Notes (Signed)
Note: pt. Adamantly declines the heat CT and the COVID test.

## 2020-02-02 NOTE — ED Triage Notes (Signed)
Patient reports severe coughing episodes starting 1 week ago, patient states he is coughing so hard he passed out in the bathroom in triage (unwitnessed by staff) - patient reports he hurt right shoulder during syncopal episode. Pain rated 10/10

## 2020-02-02 NOTE — H&P (Signed)
History and Physical    Jesus Fuller AJG:[id number] DOB: 1975-09-15 DOA: 02/02/2020  PCP: Arvilla Market, DO  Patient coming from: Home.  Chief Complaint: Shortness of breath.  HPI: Jesus Fuller is a 44 y.o. male with history of nonischemic cardiomyopathy last EF measured in April 2021 was 45 to 50% with grade 2 diastolic dysfunction has had cardiac cath at that time with history of DVT diagnosed in March 2021 on apixaban, hypertension ongoing tobacco and alcohol abuse presents to the ER because of worsening cough with shortness of breath over the last 10 days.  Patient states he feels a trickling around the throat which brings him paroxysm of cough and has had multiple episodes of syncope after cough.  Today while waiting in the ER he had another paroxysm of cough and he fell onto the floor hitting his right shoulder and losing consciousness.  Denies chest pain fever chills.  ED Course: In the ER patient is found to be wheezing chest x-ray was unremarkable x-rays of the right shoulder shows distal clavicle fracture.  EKG shows normal sinus rhythm with QTC of 400 ms.  BNP was 30 high sensitive troponins were negative sodium was 131.  Patient was started on IV steroids and nebulizer for acute bronchitis and admitted to telemetry for further observation for treatment of acute bronchitis and syncope.  Review of Systems: As per HPI, rest all negative.   Past Medical History:  Diagnosis Date  . Asthma   . Hypertension    Takes no medicine    Past Surgical History:  Procedure Laterality Date  . RIGHT/LEFT HEART CATH AND CORONARY ANGIOGRAPHY N/A 10/24/2018   Procedure: RIGHT/LEFT HEART CATH AND CORONARY ANGIOGRAPHY;  Surgeon: Orpah Cobb, MD;  Location: [facility] IN[facility];  Service: Cardiovascular;  Laterality: N/A;     reports that he has been smoking cigarettes. He has been smoking about 1.50 packs per day. He has never used smokeless tobacco. He reports current alcohol use.  He reports that he does not use drugs.  No Known Allergies  Family History  Problem Relation Age of Onset  . Cancer Mother   . COPD Mother   . Diabetes Father     Prior to Admission medications   Medication Sig Start Date End Date Taking? Authorizing Provider  acetaminophen (TYLENOL) 500 MG tablet Take 500 mg by mouth every 6 (six) hours as needed for moderate pain.   Yes [provider]  amLODipine (NORVASC) 10 MG tablet Take 1 tablet (10 mg total) by mouth daily. 11/13/19  Yes Alwyn Ren, MD  apixaban (ELIQUIS) 5 MG TABS tablet Take 1 tablet (5 mg total) by mouth 2 (two) times daily. 10/15/19  Yes Arvilla Market, DO  aspirin EC 325 MG tablet Take 325 mg by mouth daily as needed for moderate pain.   Yes [provider]  carvedilol (COREG) 12.5 MG tablet Take 1 tablet (12.5 mg total) by mouth 2 (two) times daily. 12/05/19 03/04/20 Yes Jodelle Red, MD  famotidine (PEPCID) 20 MG tablet Take 20 mg by mouth daily as needed for heartburn or indigestion.   Yes [provider]  furosemide (LASIX) 40 MG tablet Take 40 mg by mouth 2 (two) times daily.   Yes [provider]  losartan (COZAAR) 50 MG tablet Take 2 tablets (100 mg total) by mouth daily. 11/13/19  Yes Alwyn Ren, MD  nicotine (NICODERM CQ - DOSED IN MG/24 HOURS) 21 mg/24hr patch Place 1 patch (21  mg total) onto the skin daily. 11/13/19  Yes Alwyn Ren, MD  omeprazole (PRILOSEC OTC) 20 MG tablet Take 20 mg by mouth daily as needed (heartburn).   Yes [provider]  potassium chloride (KLOR-CON) 10 MEQ tablet Take 1 tablet (10 mEq total) by mouth 2 (two) times daily. 11/12/19  Yes Alwyn Ren, MD  furosemide (LASIX) 40 MG tablet Take 1 tablet (40 mg total) by mouth 2 (two) times daily. 11/12/19 12/12/19  Alwyn Ren, MD  atorvastatin (LIPITOR) 40 MG tablet Take 1 tablet (40 mg total) by mouth daily at 6 PM. Patient not taking:  Reported on 03/27/2019 10/25/18 03/27/19  Orpah Cobb, MD    Physical Exam: Constitutional: Moderately built and nourished. Vitals:   02/02/20 1752 02/02/20 1759 02/02/20 1945 02/02/20 2023  BP:  118/66 (!) 105/94 (!) 153/91  Pulse: 98 75 83 89  Resp: (!) 22 (!) 29 (!) 23 (!) 24  Temp:    98.7 F (37.1 C)  TempSrc:    Oral  SpO2: 93% 100% 93% 92%  Weight:      Height:       Eyes: Anicteric no pallor. ENMT: No discharge from the ears eyes nose or mouth. Neck: No mass felt.  No neck rigidity.  No JVD appreciated. Respiratory: Bilateral expiratory wheeze and no crepitations. Cardiovascular: S1-S2 heard. Abdomen: Soft nontender bowel sounds present. Musculoskeletal: Bilateral lower extremity edema more on the left side which patient states is chronic. Skin: No rash. Neurologic: Alert awake oriented to time place and person.  Moves all extremities. Psychiatric: Appears normal.   Labs on Admission: I have personally reviewed following labs and imaging studies  CBC: Recent Labs  Lab 02/02/20 1731  WBC 9.3  NEUTROABS 7.2  HGB 13.2  HCT 39.3  MCV 104.8*  PLT 232   Basic Metabolic Panel: Recent Labs  Lab 02/02/20 1731  NA 131*  K 4.5  CL 94*  CO2 27  GLUCOSE 120*  BUN 31*  CREATININE 1.05  CALCIUM 9.4   GFR: Estimated Creatinine Clearance: 105.4 mL/min (by C-G formula based on SCr of 1.05 mg/dL). Liver Function Tests: Recent Labs  Lab 02/02/20 1731  AST 17  ALT 16  ALKPHOS 93  BILITOT 0.7  PROT 8.4*  ALBUMIN 4.3   No results for input(s): LIPASE, AMYLASE in the last 168 hours. No results for input(s): AMMONIA in the last 168 hours. Coagulation Profile: No results for input(s): INR, PROTIME in the last 168 hours. Cardiac Enzymes: No results for input(s): CKTOTAL, CKMB, CKMBINDEX, TROPONINI in the last 168 hours. BNP (last 3 results) No results for input(s): PROBNP in the last 8760 hours. HbA1C: No results for input(s): HGBA1C in the last 72  hours. CBG: No results for input(s): GLUCAP in the last 168 hours. Lipid Profile: No results for input(s): CHOL, HDL, LDLCALC, TRIG, CHOLHDL, LDLDIRECT in the last 72 hours. Thyroid Function Tests: No results for input(s): TSH, T4TOTAL, FREET4, T3FREE, THYROIDAB in the last 72 hours. Anemia Panel: No results for input(s): VITAMINB12, FOLATE, FERRITIN, TIBC, IRON, RETICCTPCT in the last 72 hours. Urine analysis:    Component Value Date/Time   COLORURINE YELLOW 11/10/2019 1438   APPEARANCEUR CLEAR 11/10/2019 1438   LABSPEC 1.015 11/10/2019 1438   PHURINE 7.0 11/10/2019 1438   GLUCOSEU NEGATIVE 11/10/2019 1438   HGBUR NEGATIVE 11/10/2019 1438   BILIRUBINUR NEGATIVE 11/10/2019 1438   KETONESUR NEGATIVE 11/10/2019 1438   PROTEINUR NEGATIVE 11/10/2019 1438   NITRITE NEGATIVE 11/10/2019 1438  LEUKOCYTESUR NEGATIVE 11/10/2019 1438   Sepsis Labs: @LABRCNTIP (procalcitonin:4,lacticidven:4) ) Recent Results (from the past 240 hour(s))  SARS Coronavirus 2 by RT PCR (hospital order, performed in [facility] hospital lab) Nasopharyngeal Nasopharyngeal Swab     Status: None   Collection Time: 02/02/20  5:32 PM   Specimen: Nasopharyngeal Swab  Result Value Ref Range Status   SARS Coronavirus 2 NEGATIVE NEGATIVE Final    Comment: (NOTE) SARS-CoV-2 target nucleic acids are NOT DETECTED.  The SARS-CoV-2 RNA is generally detectable in upper and lower respiratory specimens during the acute phase of infection. The lowest concentration of SARS-CoV-2 viral copies this assay can detect is 250 copies / mL. A negative result does not preclude SARS-CoV-2 infection and should not be used as the sole basis for treatment or other patient management decisions.  A negative result may occur with improper specimen collection / handling, submission of specimen other than nasopharyngeal swab, presence of viral mutation(s) within the areas targeted by this assay, and inadequate number of viral copies (<250  copies / mL). A negative result must be combined with clinical observations, patient history, and epidemiological information.  Fact Sheet for Patients:   BoilerBrush.com.cy  Fact Sheet for Healthcare Providers: https://pope.com/  This test is not yet approved or  cleared by the [place] FDA and has been authorized for detection and/or diagnosis of SARS-CoV-2 by FDA under an Emergency Use Authorization (EUA).  This EUA will remain in effect (meaning this test can be used) for the duration of the COVID-19 declaration under Section 564(b)(1) of the Act, 21 U.S.C. section 360bbb-3(b)(1), unless the authorization is terminated or revoked sooner.  Performed at [facility], [address], [place], [place] [address]      Radiological Exams on Admission: DG Shoulder Right  Result Date: 02/02/2020 CLINICAL DATA:  Right shoulder pain after a fall today. Initial encounter. EXAM: RIGHT SHOULDER - 2+ VIEW COMPARISON:  None. FINDINGS: The patient has an acute, fracture of the distal clavicle with mild superior displacement of the distal fragment. The [facility] joint is intact. The humeral head is located. IMPRESSION: Acute fracture of the distal right clavicle. Electronically Signed   By: Drusilla Kanner M.D.   On: 02/02/2020 12:55   DG Chest Port 1 View  Result Date: 02/02/2020 CLINICAL DATA:  Severe coughing episodes for the past week. EXAM: PORTABLE CHEST 1 VIEW COMPARISON:  11/10/2019 FINDINGS: The heart size and mediastinal contours are within normal limits. Both lungs are clear. The visualized skeletal structures are unremarkable. IMPRESSION: Normal examination. Electronically Signed   By: Beckie Salts M.D.   On: 02/02/2020 17:17   DG Humerus Right  Result Date: 02/02/2020 CLINICAL DATA:  Right upper arm pain after a fall today. Initial encounter. EXAM: RIGHT HUMERUS - 2+ VIEW COMPARISON:  None. FINDINGS: Acute fracture of  the distal clavicle is noted as seen on dedicated plain films of the shoulder. No other abnormality is identified. IMPRESSION: Acute fracture of the distal clavicle.  Otherwise negative. Electronically Signed   By: Drusilla Kanner M.D.   On: 02/02/2020 12:57    EKG: Independently reviewed.  Normal sinus rhythm QTC of 400 ms.  Assessment/Plan Principal Problem:   Acute bronchitis Active Problems:   Essential hypertension   History of deep venous thrombosis (DVT) of distal vein of left lower extremity   Chronic combined systolic and diastolic heart failure (HCC)   Syncope    1. Acute bronchitis -strongly advised about quitting smoking.  Will check respiratory viral  panel.  Covid test was negative.  I have placed patient on IV Solu-Medrol Pulmicort and nebulizer. 2. Syncope -usually respiratory back cough.  Likely cough associated syncope.  However since patient has nonischemic cardiomyopathy we will closely monitor in telemetry and also consult cardiology.  Recently seen by [facility]ology in the office.  At that time patient was started on Coreg instead of metoprolol. 3. Nonischemic cardiomyopathy last EF measured was 45 to 50% in April 2021.  Has mild lower extremity edema.  Give 1 dose of IV Lasix and continue home dose of Lasix along with potassium ARB Coreg.  I think patient's symptoms are more likely from bronchitis. 4. Alcohol and tobacco abuse -strongly advised about quitting.  On CIWA. 5. Hypertension on amlodipine ARB and Coreg. 6. Right clavicle fracture after syncopal attack placed on sling.  Outpatient follow-up. 7. History of DVT diagnosed in March 2021 on apixaban.  Patient states he has not missed his dose of apixaban.  Patient left lower extremity looks swollen.  Get a Doppler study.   DVT prophylaxis: Apixaban. Code Status: Full code. Family Communication: Discussed with patient. Disposition Plan: Home. Consults called: Cardiology. Admission status:  Observation.   Eduard Clos MD [facility] Pager [phone].  If 7PM-7AM, please contact night-coverage [email or web address] Password [facility]  02/02/2020, 9:09 PM

## 2020-02-03 ENCOUNTER — Observation Stay (HOSPITAL_COMMUNITY): Payer: Self-pay

## 2020-02-03 ENCOUNTER — Observation Stay (HOSPITAL_BASED_OUTPATIENT_CLINIC_OR_DEPARTMENT_OTHER): Payer: Self-pay

## 2020-02-03 ENCOUNTER — Encounter (HOSPITAL_COMMUNITY): Payer: Self-pay | Admitting: Internal Medicine

## 2020-02-03 DIAGNOSIS — I509 Heart failure, unspecified: Secondary | ICD-10-CM

## 2020-02-03 DIAGNOSIS — R55 Syncope and collapse: Secondary | ICD-10-CM

## 2020-02-03 DIAGNOSIS — R609 Edema, unspecified: Secondary | ICD-10-CM

## 2020-02-03 DIAGNOSIS — Z86718 Personal history of other venous thrombosis and embolism: Secondary | ICD-10-CM

## 2020-02-03 LAB — BASIC METABOLIC PANEL
Anion gap: 9 (ref 5–15)
BUN: 30 mg/dL — ABNORMAL HIGH (ref 6–20)
CO2: 29 mmol/L (ref 22–32)
Calcium: 9.8 mg/dL (ref 8.9–10.3)
Chloride: 95 mmol/L — ABNORMAL LOW (ref 98–111)
Creatinine, Ser: 1.14 mg/dL (ref 0.61–1.24)
GFR calc Af Amer: >60 mL/min (ref >60)
GFR calc non Af Amer: >60 mL/min (ref >60)
Glucose, Bld: 143 mg/dL — ABNORMAL HIGH (ref 70–99)
Potassium: 4.9 mmol/L (ref 3.5–5.1)
Sodium: 133 mmol/L — ABNORMAL LOW (ref 135–145)

## 2020-02-03 LAB — HIV ANTIBODY (ROUTINE TESTING W REFLEX): HIV Screen 4th Generation wRfx: NONREACTIVE

## 2020-02-03 LAB — ECHOCARDIOGRAM LIMITED
Height: 69 in
Weight: 3556 oz

## 2020-02-03 LAB — RESPIRATORY PANEL BY PCR

## 2020-02-03 LAB — CBC
HCT: 38.2 % — ABNORMAL LOW (ref 39.0–52.0)
Hemoglobin: 13.1 g/dL (ref 13.0–17.0)
MCH: 35.9 pg — ABNORMAL HIGH (ref 26.0–34.0)
MCHC: 34.3 g/dL (ref 30.0–36.0)
MCV: 104.7 fL — ABNORMAL HIGH (ref 80.0–100.0)
Platelets: 236 10*3/uL (ref 150–400)
RBC: 3.65 MIL/uL — ABNORMAL LOW (ref 4.22–5.81)
RDW: 14.6 % (ref 11.5–15.5)
WBC: 9.6 10*3/uL (ref 4.0–10.5)
nRBC: 0 % (ref 0.0–0.2)

## 2020-02-03 MED ORDER — PERFLUTREN LIPID MICROSPHERE
1.0000 mL | INTRAVENOUS | Status: AC | PRN
  Administered 2020-02-03: 2 mL via INTRAVENOUS
  Filled 2020-02-03: qty 10

## 2020-02-03 MED ORDER — IPRATROPIUM-ALBUTEROL 0.5-2.5 (3) MG/3ML IN SOLN
3.0000 mL | Freq: Four times a day (QID) | RESPIRATORY_TRACT | Status: DC
  Administered 2020-02-03 – 2020-02-04 (×6): 3 mL via RESPIRATORY_TRACT
  Filled 2020-02-03 (×6): qty 3

## 2020-02-03 MED ORDER — GUAIFENESIN 100 MG/5ML PO SOLN
5.0000 mL | ORAL | Status: DC | PRN
  Administered 2020-02-03 – 2020-02-04 (×7): 100 mg via ORAL
  Filled 2020-02-03 (×7): qty 10

## 2020-02-03 NOTE — Progress Notes (Signed)
PROGRESS NOTE  SHELDON SEM YNW:[id number] DOB: 1976/01/10 DOA: 02/02/2020 PCP: Arvilla Market, DO  Brief History   Jesus Fuller is a 44 y.o. male with history of nonischemic cardiomyopathy last EF measured in April 2021 was 45 to 50% with grade 2 diastolic dysfunction has had cardiac cath at that time with history of DVT diagnosed in March 2021 on apixaban, hypertension ongoing tobacco and alcohol abuse presents to the ER because of worsening cough with shortness of breath over the last 10 days.  Patient states he feels a trickling around the throat which brings him paroxysm of cough and has had multiple episodes of syncope after cough.  Today while waiting in the ER he had another paroxysm of cough and he fell onto the floor hitting his right shoulder and losing consciousness.  Denies chest pain fever chills.  ED Course: In the ER patient is found to be wheezing chest x-ray was unremarkable x-rays of the right shoulder shows distal clavicle fracture.  EKG shows normal sinus rhythm with QTC of 400 ms.  BNP was 30 high sensitive troponins were negative sodium was 131.  Patient was started on IV steroids and nebulizer for acute bronchitis and admitted to telemetry for further observation for treatment of acute bronchitis and syncope.  [facility] have been consulted to admit the patient for further evaluation and care. He is receiving IV steroids and albuterol. He is being monitored on telemetry. Cardiology has been consulted and an echocardiogram is pending. Orthopedic surgery has been consulted regarding the patient's clavicular fracture.  Consultants  . Cardiology . Orthopedic surgery  Procedures  . None  Antibiotics   Anti-infectives (From admission, onward)   None    .  Subjective  The patient is resting comfortably. No new complaints.  Objective   Vitals:  Vitals:   02/03/20 1217 02/03/20 1342  BP: (!) 128/112   Pulse: 81   Resp: 16   Temp: 98.2 F (36.8  C)   SpO2: 94% 95%   Exam:  Constitutional:  . The patient is awake, alert, and oriented x 3. No acute distress. Respiratory:  . No increased work of breathing. Marland Kitchen Positive for wheezes bilaterally. . No rales or rhonchi are auscultated. . No tactile fremitus Cardiovascular:  . Regular rate and rhythm . No murmurs, ectopy, or gallups. . No lateral PMI. No thrills. Abdomen:  . Abdomen is soft, non-tender, non-distended . No hernias, masses, or organomegaly . Normoactive bowel sounds.  Musculoskeletal:  . No cyanosis, clubbing, or edema . Right upper extremity is in a sling. Skin:  . No rashes, lesions, ulcers . palpation of skin: no induration or nodules Neurologic:  . CN 2-12 intact . Sensation all 4 extremities intact Psychiatric:  . Mental status o Mood, affect appropriate o Orientation to person, place, time  . judgment and insight appear intact  I have personally reviewed the following:   Today's Data  . Vitals, BMP, CBC  Imaging  . Doppler of lower extremities bilaterally: No DVT  Cardiology Data  . EKG: NSR . TTE: Pending  Scheduled Meds: . amLODipine  10 mg Oral Daily  . apixaban  5 mg Oral BID  . aspirin EC  325 mg Oral Daily  . budesonide (PULMICORT) nebulizer solution  0.25 mg Nebulization BID  . carvedilol  12.5 mg Oral BID  . folic acid  1 mg Oral Daily  . furosemide  40 mg Oral BID  . ipratropium-albuterol  3 mL Nebulization Q6H  .  losartan  100 mg Oral Daily  . methylPREDNISolone (SOLU-MEDROL) injection  40 mg Intravenous Q12H  . multivitamin with minerals  1 tablet Oral Daily  . pantoprazole  40 mg Oral Daily  . potassium chloride  10 mEq Oral BID  . thiamine  100 mg Oral Daily   Or  . 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 The patient has been strongly advised to quit smoking.  Respiratory viral panel negative.  Covid test was negative.  I have placed patient on IV Solu-Medrol Pulmicort and nebulizer.  Syncope: Likely due to respiratory tract cough. However since patient has nonischemic cardiomyopathy we will closely monitor in telemetry and also consult cardiology.  Recently seen by [facility] in the office.  At that time patient was started on Coreg instead of metoprolol. Cardiology has been consulted and Echocardiogram is pending.  Nonischemic cardiomyopathy: The last EF measured was 45 to 50% in April 2021.  Has mild lower extremity edema.  Give 1 dose of IV Lasix and continue home dose of Lasix along with potassium ARB Coreg.  I think patient's symptoms are more likely from bronchitis.  Alcohol and tobacco abuse: The patient has been strongly advised about quitting.  On CIWA.  Hypertension: The patient's blood pressures are well controlled on amlodipine ARB and Coreg.  Right clavicle fracture after syncopal attack placed on sling.  Outpatient follow-up. Non-operative per orthopedic surgery.  History of DVT diagnosed in March 2021 on apixaban.  Patient states he has not missed his dose of apixaban. No DVT as per doppler.  I have seen and examined this patient myself. I have spent 32 minutes in her evaluation and care.  DVT prophylaxis: Eliquis CODE STATUS: Full Code Family Communication: None available. Disposition: Patient is from home. Anticipate disposition to home. Barriers to discharge: Completion of work up for syncope with cardiology consult and the echocardiogram.  Status is: Inpatient  Remains inpatient appropriate because:Ongoing diagnostic testing needed not appropriate for outpatient work up  Dispo: The patient is from: Home              Anticipated d/c is to: Home               Anticipated d/c date is: 2 days              Patient currently is not medically stable to d/c.  Jesus Riebe, DO [facility] Direct contact: see [email or web address]  7PM-7AM contact night coverage as above 02/03/2020, 5:34 PM  LOS: 0 days
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
## 2020-02-03 NOTE — Plan of Care (Signed)

## 2020-02-03 NOTE — Consult Note (Signed)
Reason for Consult:Right clav fx Referring Physician: A Swayze  Jesus Fuller is an 44 y.o. male.  HPI: Jesus Fuller has a problem with a severe cough that causes him to get dizzy and occasionally pass out. This happened on Friday and he fell, injuring his right shoulder. He was admitted for workup of the cough and x-rays of the shoulder showed a distal clavicle fx and orthopedic surgery was consulted. He is RHD.  Past Medical History:  Diagnosis Date  . Alcoholism /alcohol abuse (HCC)   . Aortic atherosclerosis (HCC)   . Asthma   . Chest pain with normal coronary angiography 10/2018  . Chronic combined systolic and diastolic CHF (congestive heart failure) (HCC)   . Fatty liver   . Hypertension    Takes no medicine  . Left leg DVT (HCC) 09/2019  . NICM (nonischemic cardiomyopathy) (HCC)   . Tobacco abuse     Past Surgical History:  Procedure Laterality Date  . RIGHT/LEFT HEART CATH AND CORONARY ANGIOGRAPHY N/A 10/24/2018   Procedure: RIGHT/LEFT HEART CATH AND CORONARY ANGIOGRAPHY;  Surgeon: Orpah Cobb, MD;  Location: [facility];  Service: Cardiovascular;  Laterality: N/A;    Family History  Problem Relation Age of Onset  . Cancer Mother   . COPD Mother   . Diabetes Father     Social History:  reports that he has been smoking cigarettes. He has been smoking about 1.50 packs per day. He has never used smokeless tobacco. He reports current alcohol use. He reports that he does not use drugs.  Allergies: No Known Allergies  Medications: I have reviewed the patient's current medications.  Results for orders placed or performed during the hospital encounter of 02/02/20 (from the past 48 hour(s))  CBC with Differential/Platelet     Status: Abnormal   Collection Time: 02/02/20  5:31 PM  Result Value Ref Range   WBC 9.3 4.0 - 10.5 K/uL   RBC 3.75 (L) 4.22 - 5.81 MIL/uL   Hemoglobin 13.2 13.0 - 17.0 g/dL   HCT 26.3 39 - 52 %   MCV 104.8 (H) 80.0 - 100.0 fL   MCH 35.2 (H) 26.0  - 34.0 pg   MCHC 33.6 30.0 - 36.0 g/dL   RDW 78.5 88.5 - 02.7 %   Platelets 232 150 - 400 K/uL   nRBC 0.0 0.0 - 0.2 %   Neutrophils Relative % 77 %   Neutro Abs 7.2 1.7 - 7.7 K/uL   Lymphocytes Relative 13 %   Lymphs Abs 1.2 0.7 - 4.0 K/uL   Monocytes Relative 8 %   Monocytes Absolute 0.7 0 - 1 K/uL   Eosinophils Relative 1 %   Eosinophils Absolute 0.1 0 - 0 K/uL   Basophils Relative 0 %   Basophils Absolute 0.0 0 - 0 K/uL   Immature Granulocytes 1 %   Abs Immature Granulocytes 0.08 (H) 0.00 - 0.07 K/uL    Comment: Performed at [facility], [address], [place], [place] [address]  Comprehensive metabolic panel     Status: Abnormal   Collection Time: 02/02/20  5:31 PM  Result Value Ref Range   Sodium 131 (L) 135 - 145 mmol/L   Potassium 4.5 3.5 - 5.1 mmol/L   Chloride 94 (L) 98 - 111 mmol/L   CO2 27 22 - 32 mmol/L   Glucose, Bld 120 (H) 70 - 99 mg/dL    Comment: Glucose reference range applies only to samples taken after fasting for at least  8 hours.   BUN 31 (H) 6 - 20 mg/dL   Creatinine, Ser 1.61 0.61 - 1.24 mg/dL   Calcium 9.4 8.9 - 09.6 mg/dL   Total Protein 8.4 (H) 6.5 - 8.1 g/dL   Albumin 4.3 3.5 - 5.0 g/dL   AST 17 15 - 41 U/L   ALT 16 0 - 44 U/L   Alkaline Phosphatase 93 38 - 126 U/L   Total Bilirubin 0.7 0.3 - 1.2 mg/dL   GFR calc non Af Amer >60 >60 mL/min   GFR calc Af Amer >60 >60 mL/min   Anion gap 10 5 - 15    Comment: Performed at [facility], [address], [place], [place] [address]  Troponin I (High Sensitivity)     Status: None   Collection Time: 02/02/20  5:31 PM  Result Value Ref Range   Troponin I (High Sensitivity) 4 <18 ng/L    Comment: (NOTE) Elevated high sensitivity troponin I (hsTnI) values and significant  changes across serial measurements may suggest ACS but many other  chronic and acute conditions are known to elevate hsTnI results.  Refer to the "Links" section for chest pain algorithms and  additional  guidance. Performed at [facility], [address], [place], [place] [address]   Brain natriuretic peptide     Status: None   Collection Time: 02/02/20  5:32 PM  Result Value Ref Range   B Natriuretic Peptide 30.5 0.0 - 100.0 pg/mL    Comment: Performed at [facility], [address], [place], [place] [address]  SARS Coronavirus 2 by RT PCR (hospital order, performed in [facility] hospital lab) Nasopharyngeal Nasopharyngeal Swab     Status: None   Collection Time: 02/02/20  5:32 PM   Specimen: Nasopharyngeal Swab  Result Value Ref Range   SARS Coronavirus 2 NEGATIVE NEGATIVE    Comment: (NOTE) SARS-CoV-2 target nucleic acids are NOT DETECTED.  The SARS-CoV-2 RNA is generally detectable in upper and lower respiratory specimens during the acute phase of infection. The lowest concentration of SARS-CoV-2 viral copies this assay can detect is 250 copies / mL. A negative result does not preclude SARS-CoV-2 infection and should not be used as the sole basis for treatment or other patient management decisions.  A negative result may occur with improper specimen collection / handling, submission of specimen other than nasopharyngeal swab, presence of viral mutation(s) within the areas targeted by this assay, and inadequate number of viral copies (<250 copies / mL). A negative result must be combined with clinical observations, patient history, and epidemiological information.  Fact Sheet for Patients:   BoilerBrush.com.cy  Fact Sheet for Healthcare Providers: https://pope.com/  This test is not yet approved or  cleared by the [place] FDA and has been authorized for detection and/or diagnosis of SARS-CoV-2 by FDA under an Emergency Use Authorization (EUA).  This EUA will remain in effect (meaning this test can be used) for the duration of the COVID-19 declaration under Section  564(b)(1) of the Act, 21 U.S.C. section 360bbb-3(b)(1), unless the authorization is terminated or revoked sooner.  Performed at [facility], [address], [place], [place] [address]   Blood gas, venous     Status: Abnormal   Collection Time: 02/02/20  5:32 PM  Result Value Ref Range   pH, Ven 7.361 7.25 - 7.43   pCO2, Ven 50.1 44 - 60 mmHg   pO2, Ven 30.5 (LL) 32 - 45 mmHg  Comment: CRITICAL RESULT CALLED TO, READ BACK BY AND VERIFIED WITH: WILLIAM,F PA @1812  ON 02/02/20 JACKSON,K    Bicarbonate 27.7 20.0 - 28.0 mmol/L   Acid-Base Excess 2.1 (H) 0.0 - 2.0 mmol/L   O2 Saturation 53.0 %   Patient temperature 98.6     Comment: Performed at [facility], [address], [place], [place]  Troponin I (High Sensitivity)     Status: None   Collection Time: 02/02/20  6:58 PM  Result Value Ref Range   Troponin I (High Sensitivity) 4 <18 ng/L    Comment: (NOTE) Elevated high sensitivity troponin I (hsTnI) values and significant  changes across serial measurements may suggest ACS but many other  chronic and acute conditions are known to elevate hsTnI results.  Refer to the "Links" section for chest pain algorithms and additional  guidance. Performed at [facility], [address], [place], [place]   Respiratory Panel by PCR     Status: None   Collection Time: 02/02/20 11:45 PM  Result Value Ref Range   Adenovirus NOT DETECTED NOT DETECTED   Coronavirus 229E NOT DETECTED NOT DETECTED    Comment: (NOTE) The Coronavirus on the Respiratory Panel, DOES NOT test for the novel  Coronavirus (2019 nCoV)    Coronavirus HKU1 NOT DETECTED NOT DETECTED   Coronavirus NL63 NOT DETECTED NOT DETECTED   Coronavirus OC43 NOT DETECTED NOT DETECTED   Metapneumovirus NOT DETECTED NOT DETECTED   Rhinovirus / Enterovirus NOT DETECTED NOT DETECTED   Influenza A NOT DETECTED NOT DETECTED   Influenza B NOT DETECTED NOT  DETECTED   Parainfluenza Virus 1 NOT DETECTED NOT DETECTED   Parainfluenza Virus 2 NOT DETECTED NOT DETECTED   Parainfluenza Virus 3 NOT DETECTED NOT DETECTED   Parainfluenza Virus 4 NOT DETECTED NOT DETECTED   Respiratory Syncytial Virus NOT DETECTED NOT DETECTED   Bordetella pertussis NOT DETECTED NOT DETECTED   Chlamydophila pneumoniae NOT DETECTED NOT DETECTED   Mycoplasma pneumoniae NOT DETECTED NOT DETECTED    Comment: Performed at [facility], [address] [place], [place]  Basic metabolic panel     Status: Abnormal   Collection Time: 02/03/20  5:14 AM  Result Value Ref Range   Sodium 133 (L) 135 - 145 mmol/L   Potassium 4.9 3.5 - 5.1 mmol/L   Chloride 95 (L) 98 - 111 mmol/L   CO2 29 22 - 32 mmol/L   Glucose, Bld 143 (H) 70 - 99 mg/dL    Comment: Glucose reference range applies only to samples taken after fasting for at least 8 hours.   BUN 30 (H) 6 - 20 mg/dL   Creatinine, Ser 02/05/20 0.61 - 1.24 mg/dL   Calcium 9.8 8.9 - 7.35 mg/dL   GFR calc non Af Amer >60 >60 mL/min   GFR calc Af Amer >60 >60 mL/min   Anion gap 9 5 - 15    Comment: Performed at [facility], [address], [place], [place]  CBC     Status: Abnormal   Collection Time: 02/03/20  5:14 AM  Result Value Ref Range   WBC 9.6 4.0 - 10.5 K/uL   RBC 3.65 (L) 4.22 - 5.81 MIL/uL   Hemoglobin 13.1 13.0 - 17.0 g/dL   HCT 02/05/20 (L) 39 - 52 %   MCV 104.7 (H) 80.0 - 100.0 fL   MCH 35.9 (H) 26.0 - 34.0 pg   MCHC 34.3 30.0 -  36.0 g/dL   RDW 18.2 99.3 - 71.6 %   Platelets 236 150 - 400 K/uL   nRBC 0.0 0.0 - 0.2 %    Comment: Performed at [facility], [address] [place], [place] [address]    DG Shoulder Right  Result Date: 02/02/2020 CLINICAL DATA:  Right shoulder pain after a fall today. Initial encounter. EXAM: RIGHT SHOULDER - 2+ VIEW COMPARISON:  None. FINDINGS: The patient has an acute, fracture of the distal clavicle with mild superior  displacement of the distal fragment. The [facility] joint is intact. The humeral head is located. IMPRESSION: Acute fracture of the distal right clavicle. Electronically Signed   By: Drusilla Kanner M.D.   On: 02/02/2020 12:55   DG Chest Port 1 View  Result Date: 02/02/2020 CLINICAL DATA:  Severe coughing episodes for the past week. EXAM: PORTABLE CHEST 1 VIEW COMPARISON:  11/10/2019 FINDINGS: The heart size and mediastinal contours are within normal limits. Both lungs are clear. The visualized skeletal structures are unremarkable. IMPRESSION: Normal examination. Electronically Signed   By: Beckie Salts M.D.   On: 02/02/2020 17:17   DG Humerus Right  Result Date: 02/02/2020 CLINICAL DATA:  Right upper arm pain after a fall today. Initial encounter. EXAM: RIGHT HUMERUS - 2+ VIEW COMPARISON:  None. FINDINGS: Acute fracture of the distal clavicle is noted as seen on dedicated plain films of the shoulder. No other abnormality is identified. IMPRESSION: Acute fracture of the distal clavicle.  Otherwise negative. Electronically Signed   By: Drusilla Kanner M.D.   On: 02/02/2020 12:57   VAS [place] LOWER EXTREMITY VENOUS (DVT)  Result Date: 02/03/2020  Lower Venous DVTStudy Indications: Edema.  Comparison Study: 11/10/19 previous Performing Technologist: Blanch Media RVS  Examination Guidelines: A complete evaluation includes B-mode imaging, spectral Doppler, color Doppler, and power Doppler as needed of all accessible portions of each vessel. Bilateral testing is considered an integral part of a complete examination. Limited examinations for reoccurring indications may be performed as noted. The reflux portion of the exam is performed with the patient in reverse Trendelenburg.  +---------+---------------+---------+-----------+----------+--------------+ RIGHT    CompressibilityPhasicitySpontaneityPropertiesThrombus Aging +---------+---------------+---------+-----------+----------+--------------+ CFV      Full            Yes      Yes                                 +---------+---------------+---------+-----------+----------+--------------+ SFJ      Full                                                        +---------+---------------+---------+-----------+----------+--------------+ FV Prox  Full                                                        +---------+---------------+---------+-----------+----------+--------------+ FV Mid   Full                                                        +---------+---------------+---------+-----------+----------+--------------+  FV DistalFull                                                        +---------+---------------+---------+-----------+----------+--------------+ PFV      Full                                                        +---------+---------------+---------+-----------+----------+--------------+ POP      Full           Yes      Yes                                 +---------+---------------+---------+-----------+----------+--------------+ PTV      Full                                                        +---------+---------------+---------+-----------+----------+--------------+ PERO     Full                                                        +---------+---------------+---------+-----------+----------+--------------+   +---------+---------------+---------+-----------+----------+--------------+ LEFT     CompressibilityPhasicitySpontaneityPropertiesThrombus Aging +---------+---------------+---------+-----------+----------+--------------+ CFV      Full           Yes      Yes                                 +---------+---------------+---------+-----------+----------+--------------+ SFJ      Full                                                        +---------+---------------+---------+-----------+----------+--------------+ FV Prox  Full                                                         +---------+---------------+---------+-----------+----------+--------------+ FV Mid   Full                                                        +---------+---------------+---------+-----------+----------+--------------+ FV DistalFull                                                        +---------+---------------+---------+-----------+----------+--------------+   PFV      Full                                                        +---------+---------------+---------+-----------+----------+--------------+ POP      Full           Yes      Yes                                 +---------+---------------+---------+-----------+----------+--------------+ PTV      Full                                                        +---------+---------------+---------+-----------+----------+--------------+ PERO     Full                                                        +---------+---------------+---------+-----------+----------+--------------+     Summary: BILATERAL: - No evidence of deep vein thrombosis seen in the lower extremities, bilaterally. - No evidence of superficial venous thrombosis in the lower extremities, bilaterally. -   *See table(s) above for measurements and observations.    Preliminary     Review of Systems  HENT: Negative for ear discharge, ear pain, hearing loss and tinnitus.   Eyes: Negative for photophobia and pain.  Respiratory: Positive for cough. Negative for shortness of breath.   Cardiovascular: Negative for chest pain.  Gastrointestinal: Negative for abdominal pain, nausea and vomiting.  Genitourinary: Negative for dysuria, flank pain, frequency and urgency.  Musculoskeletal: Positive for arthralgias (Right shoulder). Negative for back pain, myalgias and neck pain.  Neurological: Positive for dizziness and syncope. Negative for headaches.  Hematological: Does not bruise/bleed easily.  Psychiatric/Behavioral: The patient is not  nervous/anxious.    Blood pressure (!) 128/112, pulse 81, temperature 98.2 F (36.8 C), temperature source Oral, resp. rate 16, height  (1.753 m), weight 100.8 kg, SpO2 95 %. Physical Exam Constitutional:      General: He is not in acute distress.    Appearance: He is well-developed. He is not diaphoretic.  HENT:     Head: Normocephalic and atraumatic.  Eyes:     General: No scleral icterus.       Right eye: No discharge.        Left eye: No discharge.     Conjunctiva/sclera: Conjunctivae normal.  Cardiovascular:     Rate and Rhythm: Normal rate and regular rhythm.  Pulmonary:     Effort: Pulmonary effort is normal. No respiratory distress.  Musculoskeletal:     Cervical back: Normal range of motion.     Comments: Right shoulder, elbow, wrist, digits- no skin wounds, shoulder/upper chest ecchymotic, severe TTP, in sling, no instability, no blocks to motion  Sens  Ax/R/M/U intact  Mot   Ax/ R/ PIN/ M/ AIN/ U intact  Rad 2+  Skin:    General: Skin is warm and dry.  Neurological:     Mental  Status: He is alert.  Psychiatric:        Behavior: Behavior normal.     Assessment/Plan: Right clavicle fx -- This should heal well with non-operative management in a sling with NWB, gentle motion ok. He should f/u with Dr. [name] in 2-3 weeks.    Freeman Caldron, PA-C Orthopedic Surgery [phone] 02/03/2020, 2:01 PM

## 2020-02-03 NOTE — Progress Notes (Signed)
Echocardiogram 2D Echocardiogram limited with definity has been performed.  Leta Jungling M 02/03/2020, 1:56 PM

## 2020-02-03 NOTE — TOC Progression Note (Signed)
Transition of Care [facility]) - Progression Note    Patient Details  Name: YAMIR CARIGNAN MRN: [id number] Date of Birth: 06/11/1976  Transition of Care [facility]) CM/SW Contact  Geni Bers, RN Phone Number: 02/03/2020, 2:42 PM  Clinical Narrative:    Pt from home with parents and family. TOC will continue to follow for discharge needs.    Expected Discharge Plan: Home/Self Care Barriers to Discharge: No Barriers Identified  Expected Discharge Plan and Services Expected Discharge Plan: Home/Self Care       Living arrangements for the past 2 months: Single Family Home                                       Social Determinants of Health (SDOH) Interventions    Readmission Risk Interventions No flowsheet data found.

## 2020-02-03 NOTE — Progress Notes (Signed)
Lower extremity venous has been completed.   Preliminary results in CV Proc.   Blanch Media 02/03/2020 8:55 AM

## 2020-02-03 NOTE — Consult Note (Signed)
I have reviewed his imaging. Tentative plan is nonop for his clavicle. Sling full time  Formal consult to follow   Sheral Apley

## 2020-02-04 ENCOUNTER — Other Ambulatory Visit (HOSPITAL_COMMUNITY): Payer: Self-pay | Admitting: Internal Medicine

## 2020-02-04 MED ORDER — BUDESONIDE 0.25 MG/2ML IN SUSP: 0.2500 mg | Freq: Two times a day (BID) | RESPIRATORY_TRACT | 12 refills | Status: DC

## 2020-02-04 MED ORDER — GUAIFENESIN 100 MG/5ML PO SOLN: 5.0000 mL | ORAL | 0 refills | Status: DC | PRN

## 2020-02-04 MED ORDER — THIAMINE HCL 100 MG PO TABS: 100.0000 mg | ORAL_TABLET | Freq: Every day | ORAL | 0 refills | Status: DC

## 2020-02-04 MED ORDER — PREDNISONE 10 MG PO TABS
ORAL_TABLET | ORAL | 0 refills | Status: DC
Start: 2020-02-04 — End: 2020-02-21

## 2020-02-04 MED ORDER — FOLIC ACID 1 MG PO TABS: 1.0000 mg | ORAL_TABLET | Freq: Every day | ORAL | 0 refills | Status: DC

## 2020-02-04 MED ORDER — PREDNISONE 10 MG PO TABS
ORAL_TABLET | ORAL | 0 refills | Status: DC
Start: 2020-02-04 — End: 2020-02-04

## 2020-02-04 MED ORDER — ALBUTEROL SULFATE (2.5 MG/3ML) 0.083% IN NEBU: 2.5000 mg | INHALATION_SOLUTION | RESPIRATORY_TRACT | 12 refills | Status: DC | PRN

## 2020-02-04 MED ORDER — IPRATROPIUM-ALBUTEROL 0.5-2.5 (3) MG/3ML IN SOLN: 3.0000 mL | Freq: Four times a day (QID) | RESPIRATORY_TRACT | 0 refills | Status: DC

## 2020-02-04 MED ORDER — ADULT MULTIVITAMIN W/MINERALS CH: 1.0000 | ORAL_TABLET | Freq: Every day | ORAL | 0 refills | Status: DC

## 2020-02-04 MED FILL — BUDESONIDE 0.25 MG/2ML SUSP: 0.25 | 30 days supply | Qty: 60 | Fill #0

## 2020-02-04 MED FILL — ALBUTEROL SUL 2.5 MG/3 ML S: (2.5 MG/3ML | 2 days supply | Qty: 75 | Fill #0

## 2020-02-04 MED FILL — !ELIQUIS 5MG TABLET: 5 | 30 days supply | Qty: 60 | Fill #3 | Status: TO

## 2020-02-04 MED FILL — LOSARTAN POTASSIUM 50 MG TA: 50 | 30 days supply | Qty: 60 | Fill #2 | Status: TO

## 2020-02-04 MED FILL — predniSONE 10 MG TABS: 10 | 18 days supply | Qty: 63 | Fill #0

## 2020-02-04 MED FILL — POTASSIUM CHLORIDE ER 10 ME: 10 | 30 days supply | Qty: 60 | Fill #2 | Status: TO

## 2020-02-04 MED FILL — IPRAT-ALBUT 0.5-3(2.5) MG/3: 0.5-2.5 (3) | 30 days supply | Qty: 360 | Fill #0

## 2020-02-04 MED FILL — FOLIC ACID 1 MG TABS: 1 | 30 days supply | Qty: 30 | Fill #0

## 2020-02-04 NOTE — TOC Progression Note (Signed)
Transition of Care [facility]) - Progression Note    Patient Details  Name: Jesus Fuller MRN: [id number] Date of Birth: 1976/04/17  [facility]) CM/SW Contact  Geni Bers, RN Phone Number: 02/04/2020, 1:16 PM  Clinical Narrative:    Pt needs nebulizer machine, checking with [employer].    Expected Discharge Plan: Home/Self Care Barriers to Discharge: No Barriers Identified  Expected Discharge Plan and Services Expected Discharge Plan: Home/Self Care       Living arrangements for the past 2 months: Single Family Home Expected Discharge Date: 02/04/20                                     Social Determinants of Health (SDOH) Interventions    Readmission Risk Interventions No flowsheet data found.

## 2020-02-04 NOTE — Discharge Summary (Signed)
Physician Discharge Summary  Jesus SANTELLAN YHO:[id number] DOB: 1976-06-15 DOA: 02/02/2020  PCP: Arvilla Market, DO  Admit date: 02/02/2020 Discharge date: 02/04/2020  Recommendations for Outpatient Follow-up:  1. Discharge to home 2. Follow up with PCP in 7-10 days. Have chemistry drawn on that visit 3. The patient is not to smoke and he is not to be around anyone that does smoke. 4. Follow up with cardiology as directed.   Follow-up Information    [facility] Follow up.   Specialty: Internal Medicine Why: call this number or [facility] [phone] for an appointment with primary care doctor.  Contact information: [address] [place] [address] [phone]             Discharge Diagnoses: Principal diagnosis is #1 1. Syncope 2. Acute bronchitis 3. Ischemic cardiomyopathy 4. Right clavicular fracture  Discharge Condition: Fair  Disposition: Home  Diet recommendation: Heart healthy  Filed Weights   02/02/20 1211 02/03/20 0500 02/04/20 0500  Weight: 95.3 kg 100.8 kg 100.8 kg   History of present illness:  Jesus Fuller is a 44 y.o. male with history of nonischemic cardiomyopathy last EF measured in April 2021 was 45 to 50% with grade 2 diastolic dysfunction has had cardiac cath at that time with history of DVT diagnosed in March 2021 on apixaban, hypertension ongoing tobacco and alcohol abuse presents to the ER because of worsening cough with shortness of breath over the last 10 days.  Patient states he feels a trickling around the throat which brings him paroxysm of cough and has had multiple episodes of syncope after cough.  Today while waiting in the ER he had another paroxysm of cough and he fell onto the floor hitting his right shoulder and losing consciousness.  Denies chest pain fever chills.  ED Course: In the ER patient is found to be wheezing chest x-ray was unremarkable x-rays  of the right shoulder shows distal clavicle fracture.  EKG shows normal sinus rhythm with QTC of 400 ms.  BNP was 30 high sensitive troponins were negative sodium was 131.  Patient was started on IV steroids and nebulizer for acute bronchitis and admitted to telemetry for further observation for treatment of acute bronchitis and syncope.  Hospital Course: [facility] have been consulted to admit the patient for further evaluation and care. He is receiving IV steroids and albuterol. He is being monitored on telemetry. Cardiology has been consulted and an echocardiogram is pending. Orthopedic surgery has been consulted regarding the patient's clavicular fracture.  Echocardiogram has been performed. It has demonstrated EF of 60-65% with normal left ventricular function. There are no regional wall motion abnormalities. The RV is normal in size and function.  Orthopedic surgery has determined that no surgical intervention is required for the patient's clavicular fracture. They have recommended no weight bearing on the right upper extremity and follow up with Dr. [name] in 2-3 weeks.  Cardiology was consulted and felt that the patient's syncopal episode was most likely due to coughing episode. He will follow up with Dr. [name] on 03/18/2020. If the patient has another syncopal episode, consideration should be given to an event monitor.  Doppler for DVT of the left lower extremity was negative. The patient is continued on apixaban for DVT of 09/2019.  He is cleared for discharge to home.  Today's assessment: S: The patient is resting comfortably. No new complaints. O: Vitals:  Vitals:   02/04/20  0320 02/04/20 0825  BP: 126/73   Pulse: 91   Resp: 18   Temp: 97.8 F (36.6 C)   SpO2: 92% 90%   Exam:  Constitutional:  . The patient is awake, alert, and oriented x 3. No acute distress. Respiratory:  . No increased work of breathing. . No wheezes, rales, or rhonchi . No tactile  fremitus Cardiovascular:  . Regular rate and rhythm . No murmurs, ectopy, or gallups. . No lateral PMI. No thrills. . Cough with deep respiration. Fair air entry. Abdomen:  . Abdomen is soft, non-tender, non-distended . No hernias, masses, or organomegaly . Normoactive bowel sounds.  Musculoskeletal:  . No cyanosis, clubbing, or edema . Right upper extremity in sling. Skin:  . No rashes, lesions, ulcers . palpation of skin: no induration or nodules Neurologic:  . CN 2-12 intact . Sensation all 4 extremities intact Psychiatric:  . Mental status o Mood, affect appropriate o Orientation to person, place, time  . judgment and insight appear intact  Discharge Instructions  Discharge Instructions    Activity as tolerated - No restrictions   Complete by: As directed    Call MD for:  difficulty breathing, headache or visual disturbances   Complete by: As directed    Call MD for:  persistant dizziness or light-headedness   Complete by: As directed    Diet - low sodium heart healthy   Complete by: As directed    Discharge instructions   Complete by: As directed    Discharge to home Follow up with PCP in 7-10 days. Have chemistry drawn on that visit The patient is not to smoke and he is not to be around anyone that does smoke. Follow up with cardiology as directed.   For home use only DME Nebulizer machine   Complete by: As directed    Patient needs a nebulizer to treat with the following condition: COPD (chronic obstructive pulmonary disease) (HCC)   Length of Need: Lifetime   Increase activity slowly   Complete by: As directed      Allergies as of 02/04/2020   No Known Allergies     Medication List    TAKE these medications   acetaminophen 500 MG tablet Commonly known as: TYLENOL Take 500 mg by mouth every 6 (six) hours as needed for moderate pain.   albuterol (2.5 MG/3ML) 0.083% nebulizer solution Commonly known as: PROVENTIL Take 3 mLs (2.5 mg total) by  nebulization every 2 (two) hours as needed for wheezing.   amLODipine 10 MG tablet Commonly known as: NORVASC Take 1 tablet (10 mg total) by mouth daily.   apixaban 5 MG Tabs tablet Commonly known as: ELIQUIS Take 1 tablet (5 mg total) by mouth 2 (two) times daily.   aspirin EC 325 MG tablet Take 325 mg by mouth daily as needed for moderate pain.   budesonide 0.25 MG/2ML nebulizer solution Commonly known as: PULMICORT Take 2 mLs (0.25 mg total) by nebulization 2 (two) times daily.   carvedilol 12.5 MG tablet Commonly known as: COREG Take 1 tablet (12.5 mg total) by mouth 2 (two) times daily.   famotidine 20 MG tablet Commonly known as: PEPCID Take 20 mg by mouth daily as needed for heartburn or indigestion.   folic acid 1 MG tablet Commonly known as: FOLVITE Take 1 tablet (1 mg total) by mouth daily. Start taking on: February 05, 2020   furosemide 40 MG tablet Commonly known as: Lasix Take 1 tablet (40 mg total) by mouth 2 (  two) times daily. What changed: Another medication with the same name was removed. Continue taking this medication, and follow the directions you see here.   guaiFENesin 100 MG/5ML Soln Commonly known as: ROBITUSSIN Take 5 mLs (100 mg total) by mouth every 4 (four) hours as needed for cough or to loosen phlegm.   ipratropium-albuterol 0.5-2.5 (3) MG/3ML Soln Commonly known as: DUONEB Take 3 mLs by nebulization every 6 (six) hours.   losartan 50 MG tablet Commonly known as: COZAAR Take 2 tablets (100 mg total) by mouth daily.   multivitamin with minerals Tabs tablet Take 1 tablet by mouth daily. Start taking on: February 05, 2020   nicotine 21 mg/24hr patch Commonly known as: NICODERM CQ - dosed in mg/24 hours Place 1 patch (21 mg total) onto the skin daily.   omeprazole 20 MG tablet Commonly known as: PRILOSEC OTC Take 20 mg by mouth daily as needed (heartburn).   potassium chloride 10 MEQ tablet Commonly known as: KLOR-CON Take 1 tablet (10  mEq total) by mouth 2 (two) times daily.   predniSONE 10 MG tablet Commonly known as: DELTASONE Take 6 tablets (60 mg total) by mouth daily for 3 days, THEN 5 tablets (50 mg total) daily for 3 days, THEN 4 tablets (40 mg total) daily for 3 days, THEN 3 tablets (30 mg total) daily for 3 days, THEN 2 tablets (20 mg total) daily for 3 days, THEN 1 tablet (10 mg total) daily for 3 days. Start taking on: February 04, 2020   thiamine 100 MG tablet Take 1 tablet (100 mg total) by mouth daily. Start taking on: February 05, 2020            Durable Medical Equipment  (From admission, onward)         Start     Ordered   02/04/20 0000  For home use only DME Nebulizer machine       Question Answer Comment  Patient needs a nebulizer to treat with the following condition COPD (chronic obstructive pulmonary disease) (HCC)   Length of Need Lifetime      02/04/20 1158         No Known Allergies  The results of significant diagnostics from this hospitalization (including imaging, microbiology, ancillary and laboratory) are listed below for reference.    Significant Diagnostic Studies: DG Shoulder Right  Result Date: 02/02/2020 CLINICAL DATA:  Right shoulder pain after a fall today. Initial encounter. EXAM: RIGHT SHOULDER - 2+ VIEW COMPARISON:  None. FINDINGS: The patient has an acute, fracture of the distal clavicle with mild superior displacement of the distal fragment. The [facility] joint is intact. The humeral head is located. IMPRESSION: Acute fracture of the distal right clavicle. Electronically Signed   By: Drusilla Kanner M.D.   On: 02/02/2020 12:55   DG Chest Port 1 View  Result Date: 02/02/2020 CLINICAL DATA:  Severe coughing episodes for the past week. EXAM: PORTABLE CHEST 1 VIEW COMPARISON:  11/10/2019 FINDINGS: The heart size and mediastinal contours are within normal limits. Both lungs are clear. The visualized skeletal structures are unremarkable. IMPRESSION: Normal examination.  Electronically Signed   By: Beckie Salts M.D.   On: 02/02/2020 17:17   DG Humerus Right  Result Date: 02/02/2020 CLINICAL DATA:  Right upper arm pain after a fall today. Initial encounter. EXAM: RIGHT HUMERUS - 2+ VIEW COMPARISON:  None. FINDINGS: Acute fracture of the distal clavicle is noted as seen on dedicated plain films of the shoulder. No other abnormality  is identified. IMPRESSION: Acute fracture of the distal clavicle.  Otherwise negative. Electronically Signed   By: Drusilla Kanner M.D.   On: 02/02/2020 12:57   VAS [place] LOWER EXTREMITY VENOUS (DVT)  Result Date: 02/03/2020  Lower Venous DVTStudy Indications: Edema.  Comparison Study: 11/10/19 previous Performing Technologist: Blanch Media RVS  Examination Guidelines: A complete evaluation includes B-mode imaging, spectral Doppler, color Doppler, and power Doppler as needed of all accessible portions of each vessel. Bilateral testing is considered an integral part of a complete examination. Limited examinations for reoccurring indications may be performed as noted. The reflux portion of the exam is performed with the patient in reverse Trendelenburg.  +---------+---------------+---------+-----------+----------+--------------+ RIGHT    CompressibilityPhasicitySpontaneityPropertiesThrombus Aging +---------+---------------+---------+-----------+----------+--------------+ CFV      Full           Yes      Yes                                 +---------+---------------+---------+-----------+----------+--------------+ SFJ      Full                                                        +---------+---------------+---------+-----------+----------+--------------+ FV Prox  Full                                                        +---------+---------------+---------+-----------+----------+--------------+ FV Mid   Full                                                         +---------+---------------+---------+-----------+----------+--------------+ FV DistalFull                                                        +---------+---------------+---------+-----------+----------+--------------+ PFV      Full                                                        +---------+---------------+---------+-----------+----------+--------------+ POP      Full           Yes      Yes                                 +---------+---------------+---------+-----------+----------+--------------+ PTV      Full                                                        +---------+---------------+---------+-----------+----------+--------------+  PERO     Full                                                        +---------+---------------+---------+-----------+----------+--------------+   +---------+---------------+---------+-----------+----------+--------------+ LEFT     CompressibilityPhasicitySpontaneityPropertiesThrombus Aging +---------+---------------+---------+-----------+----------+--------------+ CFV      Full           Yes      Yes                                 +---------+---------------+---------+-----------+----------+--------------+ SFJ      Full                                                        +---------+---------------+---------+-----------+----------+--------------+ FV Prox  Full                                                        +---------+---------------+---------+-----------+----------+--------------+ FV Mid   Full                                                        +---------+---------------+---------+-----------+----------+--------------+ FV DistalFull                                                        +---------+---------------+---------+-----------+----------+--------------+ PFV      Full                                                         +---------+---------------+---------+-----------+----------+--------------+ POP      Full           Yes      Yes                                 +---------+---------------+---------+-----------+----------+--------------+ PTV      Full                                                        +---------+---------------+---------+-----------+----------+--------------+ PERO     Full                                                        +---------+---------------+---------+-----------+----------+--------------+  Summary: BILATERAL: - No evidence of deep vein thrombosis seen in the lower extremities, bilaterally. - No evidence of superficial venous thrombosis in the lower extremities, bilaterally. -   *See table(s) above for measurements and observations. Electronically signed by Sherald Hess MD on 02/03/2020 at 2:46:28 PM.    Final    ECHOCARDIOGRAM LIMITED  Result Date: 02/03/2020    ECHOCARDIOGRAM LIMITED REPORT   Patient Name:   Jesus Fuller Date of Exam: 02/03/2020 Medical Rec #:  [id number]      Height:       69.0 in Accession #:    [id number]     Weight:       222.2 lb Date of Birth:  11/14/1975      BSA:          2.161 m Patient Age:    43 years       BP:           128/112 mmHg Patient Gender: M              HR:           81 bpm. Exam Location:  Inpatient Procedure: Limited Echo and Intracardiac Opacification Agent Indications:    Syncope 780.2 / R55  History:        Patient has prior history of Echocardiogram examinations, most                 recent 11/11/2019. CHF; Risk Factors:Hypertension and Current                 Smoker. ETOH use. Severe coughing episodes with post-tussive                 syncope. History of DVT.  Sonographer:    Leta Jungling RDCS Referring Phys: 12 DAYNA N DUNN  Sonographer Comments: Technically difficult study due to poor echo windows. Image acquisition challenging due to respiratory motion. Imaging very limited due to severe coughing. IMPRESSIONS  1.  Left ventricular ejection fraction, by estimation, is 60 to 65%. The left ventricle has normal function. The left ventricle has no regional wall motion abnormalities.  2. Right ventricular systolic function is normal. The right ventricular size is normal. Conclusion(s)/Recommendation(s): Limted echo for function. Normal to hyperdynamic LV EF without focal WMA. Normal RV. FINDINGS  Left Ventricle: Left ventricular ejection fraction, by estimation, is 60 to 65%. The left ventricle has normal function. The left ventricle has no regional wall motion abnormalities. Definity contrast agent was given IV to delineate the left ventricular  endocardial borders. Right Ventricle: The right ventricular size is normal. Right ventricular systolic function is normal. RIGHT ATRIUM           Index RA Area:     13.70 cm RA Volume:   30.80 ml  14.25 ml/m Jodelle Red MD Electronically signed by Jodelle Red MD Signature Date/Time: 02/03/2020/5:09:52 PM    Final     Microbiology: Recent Results (from the past 240 hour(s))  SARS Coronavirus 2 by RT PCR (hospital order, performed in [facility] hospital lab) Nasopharyngeal Nasopharyngeal Swab     Status: None   Collection Time: 02/02/20  5:32 PM   Specimen: Nasopharyngeal Swab  Result Value Ref Range Status   SARS Coronavirus 2 NEGATIVE NEGATIVE Final    Comment: (NOTE) SARS-CoV-2 target nucleic acids are NOT DETECTED.  The SARS-CoV-2 RNA is generally detectable in upper and lower respiratory specimens during the acute phase of infection. The lowest concentration of SARS-CoV-2 viral copies  this assay can detect is 250 copies / mL. A negative result does not preclude SARS-CoV-2 infection and should not be used as the sole basis for treatment or other patient management decisions.  A negative result may occur with improper specimen collection / handling, submission of specimen other than nasopharyngeal swab, presence of viral mutation(s) within  the areas targeted by this assay, and inadequate number of viral copies (<250 copies / mL). A negative result must be combined with clinical observations, patient history, and epidemiological information.  Fact Sheet for Patients:   BoilerBrush.com.cy  Fact Sheet for Healthcare Providers: https://pope.com/  This test is not yet approved or  cleared by the [place] FDA and has been authorized for detection and/or diagnosis of SARS-CoV-2 by FDA under an Emergency Use Authorization (EUA).  This EUA will remain in effect (meaning this test can be used) for the duration of the COVID-19 declaration under Section 564(b)(1) of the Act, 21 U.S.C. section 360bbb-3(b)(1), unless the authorization is terminated or revoked sooner.  Performed at [facility], [address] [place], [place] [address]   Respiratory Panel by PCR     Status: None   Collection Time: 02/02/20 11:45 PM  Result Value Ref Range Status   Adenovirus NOT DETECTED NOT DETECTED Final   Coronavirus 229E NOT DETECTED NOT DETECTED Final    Comment: (NOTE) The Coronavirus on the Respiratory Panel, DOES NOT test for the novel  Coronavirus (2019 nCoV)    Coronavirus HKU1 NOT DETECTED NOT DETECTED Final   Coronavirus NL63 NOT DETECTED NOT DETECTED Final   Coronavirus OC43 NOT DETECTED NOT DETECTED Final   Metapneumovirus NOT DETECTED NOT DETECTED Final   Rhinovirus / Enterovirus NOT DETECTED NOT DETECTED Final   Influenza A NOT DETECTED NOT DETECTED Final   Influenza B NOT DETECTED NOT DETECTED Final   Parainfluenza Virus 1 NOT DETECTED NOT DETECTED Final   Parainfluenza Virus 2 NOT DETECTED NOT DETECTED Final   Parainfluenza Virus 3 NOT DETECTED NOT DETECTED Final   Parainfluenza Virus 4 NOT DETECTED NOT DETECTED Final   Respiratory Syncytial Virus NOT DETECTED NOT DETECTED Final   Bordetella pertussis NOT DETECTED NOT DETECTED Final   Chlamydophila  pneumoniae NOT DETECTED NOT DETECTED Final   Mycoplasma pneumoniae NOT DETECTED NOT DETECTED Final    Comment: Performed at [facility], [address] [place], [place] [address]     Labs: Basic Metabolic Panel: Recent Labs  Lab 02/02/20 1731 02/03/20 0514  NA 131* 133*  K 4.5 4.9  CL 94* 95*  CO2 27 29  GLUCOSE 120* 143*  BUN 31* 30*  CREATININE 1.05 1.14  CALCIUM 9.4 9.8   Liver Function Tests: Recent Labs  Lab 02/02/20 1731  AST 17  ALT 16  ALKPHOS 93  BILITOT 0.7  PROT 8.4*  ALBUMIN 4.3   No results for input(s): LIPASE, AMYLASE in the last 168 hours. No results for input(s): AMMONIA in the last 168 hours. CBC: Recent Labs  Lab 02/02/20 1731 02/03/20 0514  WBC 9.3 9.6  NEUTROABS 7.2  --   HGB 13.2 13.1  HCT 39.3 38.2*  MCV 104.8* 104.7*  PLT 232 236   Cardiac Enzymes: No results for input(s): CKTOTAL, CKMB, CKMBINDEX, TROPONINI in the last 168 hours. BNP: BNP (last 3 results) Recent Labs    11/10/19 1237 02/02/20 1732  BNP 418.4* 30.5    ProBNP (last 3 results) No results for input(s): PROBNP in the last 8760 hours.  CBG: No  results for input(s): GLUCAP in the last 168 hours.  Principal Problem:   Acute bronchitis Active Problems:   Essential hypertension   History of deep venous thrombosis (DVT) of distal vein of left lower extremity   Chronic combined systolic and diastolic heart failure (HCC)   Syncope   Time coordinating discharge: 38 minutes  Signed:        Jonika Critz, DO [facility]  02/04/2020, 4:43 PM

## 2020-02-04 NOTE — TOC Progression Note (Signed)
Transition of Care [facility]) - Progression Note    Patient Details  Name: Jesus Fuller MRN: [id number] Date of Birth: Dec 05, 1975  Transition of [facility]) CM/SW Contact  Geni Bers, RN Phone Number: 02/04/2020, 1:44 PM  Clinical Narrative:    Encouraged pt to call to make an appointment with [facility].    Expected Discharge Plan: Home/Self Care Barriers to Discharge: No Barriers Identified  Expected Discharge Plan and Services Expected Discharge Plan: Home/Self Care       Living arrangements for the past 2 months: Single Family Home Expected Discharge Date: 02/04/20                                     Social Determinants of Health (SDOH) Interventions    Readmission Risk Interventions No flowsheet data found.

## 2020-02-04 NOTE — Progress Notes (Addendum)
Progress Note  Patient Name: Jesus Fuller Date of Encounter: 02/04/2020  Primary Cardiologist: Jodelle Red, MD  Subjective   Feeling better. Still occasional coughing. No CP.  Inpatient Medications    Scheduled Meds: . amLODipine  10 mg Oral Daily  . apixaban  5 mg Oral BID  . aspirin EC  325 mg Oral Daily  . budesonide (PULMICORT) nebulizer solution  0.25 mg Nebulization BID  . carvedilol  12.5 mg Oral BID  . folic acid  1 mg Oral Daily  . furosemide  40 mg Oral BID  . ipratropium-albuterol  3 mL Nebulization Q6H  . losartan  100 mg Oral Daily  . methylPREDNISolone (SOLU-MEDROL) injection  40 mg Intravenous Q12H  . multivitamin with minerals  1 tablet Oral Daily  . pantoprazole  40 mg Oral Daily  . potassium chloride  10 mEq Oral BID  . thiamine  100 mg Oral Daily   Or  . thiamine  100 mg Intravenous Daily   Continuous Infusions:  PRN Meds: albuterol, famotidine, guaiFENesin, LORazepam **OR** LORazepam, ondansetron **OR** ondansetron (ZOFRAN) IV   Vital Signs    Vitals:   02/04/20 0226 02/04/20 0320 02/04/20 0500 02/04/20 0825  BP:  126/73    Pulse:  91    Resp:  18    Temp:  97.8 F (36.6 C)    TempSrc:  Oral    SpO2: 90% 92%  90%  Weight:   100.8 kg   Height:        Intake/Output Summary (Last 24 hours) at 02/04/2020 0848 Last data filed at 02/04/2020 0600 Gross per 24 hour  Intake --  Output 850 ml  Net -850 ml   Last 3 Weights 02/04/2020 02/03/2020 02/02/2020  Weight (lbs) 222 lb 4.8 oz 222 lb 4 oz 210 lb  Weight (kg) 100.835 kg 100.812 kg 95.255 kg     Telemetry    NSR, occasional episodes of brief sinus tach (suspect related during coughing) - Personally Reviewed  Physical Exam   GEN: No acute distress.  HEENT: Normocephalic, atraumatic, sclera non-icteric. Neck: No JVD or bruits. Cardiac: RRR no murmurs, rubs, or gallops.  Radials/DP/PT 1+ and equal bilaterally.  Respiratory: Diffusely diminished BS with much less  rhonchi/wheezing today. Breathing is unlabored. GI: Soft, nontender, non-distended, BS +x 4. MS: no deformity. Extremities: No clubbing or cyanosis. No edema. Distal pedal pulses are 2+ and equal bilaterally. R arm in sling Neuro:  AAOx3. Follows commands. Psych:  Responds to questions appropriately with a normal affect.  Labs    High Sensitivity Troponin:   Recent Labs  Lab 02/02/20 1731 02/02/20 1858  TROPONINIHS 4 4      Cardiac EnzymesNo results for input(s): TROPONINI in the last 168 hours. No results for input(s): TROPIPOC in the last 168 hours.   Chemistry Recent Labs  Lab 02/02/20 1731 02/03/20 0514  NA 131* 133*  K 4.5 4.9  CL 94* 95*  CO2 27 29  GLUCOSE 120* 143*  BUN 31* 30*  CREATININE 1.05 1.14  CALCIUM 9.4 9.8  PROT 8.4*  --   ALBUMIN 4.3  --   AST 17  --   ALT 16  --   ALKPHOS 93  --   BILITOT 0.7  --   GFRNONAA >60 >60  GFRAA >60 >60  ANIONGAP 10 9     Hematology Recent Labs  Lab 02/02/20 1731 02/03/20 0514  WBC 9.3 9.6  RBC 3.75* 3.65*  HGB 13.2 13.1  HCT 39.3  38.2*  MCV 104.8* 104.7*  MCH 35.2* 35.9*  MCHC 33.6 34.3  RDW 14.3 14.6  PLT 232 236    BNP Recent Labs  Lab 02/02/20 1732  BNP 30.5     DDimer No results for input(s): DDIMER in the last 168 hours.   Radiology    DG Shoulder Right  Result Date: 02/02/2020 CLINICAL DATA:  Right shoulder pain after a fall today. Initial encounter. EXAM: RIGHT SHOULDER - 2+ VIEW COMPARISON:  None. FINDINGS: The patient has an acute, fracture of the distal clavicle with mild superior displacement of the distal fragment. The [facility] joint is intact. The humeral head is located. IMPRESSION: Acute fracture of the distal right clavicle. Electronically Signed   By: Drusilla Kanner M.D.   On: 02/02/2020 12:55   DG Chest Port 1 View  Result Date: 02/02/2020 CLINICAL DATA:  Severe coughing episodes for the past week. EXAM: PORTABLE CHEST 1 VIEW COMPARISON:  11/10/2019 FINDINGS: The heart size and  mediastinal contours are within normal limits. Both lungs are clear. The visualized skeletal structures are unremarkable. IMPRESSION: Normal examination. Electronically Signed   By: Beckie Salts M.D.   On: 02/02/2020 17:17   DG Humerus Right  Result Date: 02/02/2020 CLINICAL DATA:  Right upper arm pain after a fall today. Initial encounter. EXAM: RIGHT HUMERUS - 2+ VIEW COMPARISON:  None. FINDINGS: Acute fracture of the distal clavicle is noted as seen on dedicated plain films of the shoulder. No other abnormality is identified. IMPRESSION: Acute fracture of the distal clavicle.  Otherwise negative. Electronically Signed   By: Drusilla Kanner M.D.   On: 02/02/2020 12:57   VAS [place] LOWER EXTREMITY VENOUS (DVT)  Result Date: 02/03/2020  Lower Venous DVTStudy Indications: Edema.  Comparison Study: 11/10/19 previous Performing Technologist: Blanch Media RVS  Examination Guidelines: A complete evaluation includes B-mode imaging, spectral Doppler, color Doppler, and power Doppler as needed of all accessible portions of each vessel. Bilateral testing is considered an integral part of a complete examination. Limited examinations for reoccurring indications may be performed as noted. The reflux portion of the exam is performed with the patient in reverse Trendelenburg.  +---------+---------------+---------+-----------+----------+--------------+ RIGHT    CompressibilityPhasicitySpontaneityPropertiesThrombus Aging +---------+---------------+---------+-----------+----------+--------------+ CFV      Full           Yes      Yes                                 +---------+---------------+---------+-----------+----------+--------------+ SFJ      Full                                                        +---------+---------------+---------+-----------+----------+--------------+ FV Prox  Full                                                         +---------+---------------+---------+-----------+----------+--------------+ FV Mid   Full                                                        +---------+---------------+---------+-----------+----------+--------------+  FV DistalFull                                                        +---------+---------------+---------+-----------+----------+--------------+ PFV      Full                                                        +---------+---------------+---------+-----------+----------+--------------+ POP      Full           Yes      Yes                                 +---------+---------------+---------+-----------+----------+--------------+ PTV      Full                                                        +---------+---------------+---------+-----------+----------+--------------+ PERO     Full                                                        +---------+---------------+---------+-----------+----------+--------------+   +---------+---------------+---------+-----------+----------+--------------+ LEFT     CompressibilityPhasicitySpontaneityPropertiesThrombus Aging +---------+---------------+---------+-----------+----------+--------------+ CFV      Full           Yes      Yes                                 +---------+---------------+---------+-----------+----------+--------------+ SFJ      Full                                                        +---------+---------------+---------+-----------+----------+--------------+ FV Prox  Full                                                        +---------+---------------+---------+-----------+----------+--------------+ FV Mid   Full                                                        +---------+---------------+---------+-----------+----------+--------------+ FV DistalFull                                                         +---------+---------------+---------+-----------+----------+--------------+  PFV      Full                                                        +---------+---------------+---------+-----------+----------+--------------+ POP      Full           Yes      Yes                                 +---------+---------------+---------+-----------+----------+--------------+ PTV      Full                                                        +---------+---------------+---------+-----------+----------+--------------+ PERO     Full                                                        +---------+---------------+---------+-----------+----------+--------------+     Summary: BILATERAL: - No evidence of deep vein thrombosis seen in the lower extremities, bilaterally. - No evidence of superficial venous thrombosis in the lower extremities, bilaterally. -   *See table(s) above for measurements and observations. Electronically signed by Sherald Hess MD on 02/03/2020 at 2:46:28 PM.    Final    ECHOCARDIOGRAM LIMITED  Result Date: 02/03/2020    ECHOCARDIOGRAM LIMITED REPORT   Patient Name:   Jesus Fuller Date of Exam: 02/03/2020 Medical Rec #:  [id number]      Height:       69.0 in Accession #:    [id number]     Weight:       222.2 lb Date of Birth:  01/01/1976      BSA:          2.161 m Patient Age:    43 years       BP:           128/112 mmHg Patient Gender: M              HR:           81 bpm. Exam Location:  Inpatient Procedure: Limited Echo and Intracardiac Opacification Agent Indications:    Syncope 780.2 / R55  History:        Patient has prior history of Echocardiogram examinations, most                 recent 11/11/2019. CHF; Risk Factors:Hypertension and Current                 Smoker. ETOH use. Severe coughing episodes with post-tussive                 syncope. History of DVT.  Sonographer:    Leta Jungling RDCS Referring Phys: [id number] DAYNA N DUNN  Sonographer Comments: Technically difficult  study due to poor echo windows. Image acquisition challenging due to respiratory motion. Imaging very limited due to severe coughing. IMPRESSIONS  1. Left ventricular ejection fraction, by estimation, is 60 to 65%.  The left ventricle has normal function. The left ventricle has no regional wall motion abnormalities.  2. Right ventricular systolic function is normal. The right ventricular size is normal. Conclusion(s)/Recommendation(s): Limted echo for function. Normal to hyperdynamic LV EF without focal WMA. Normal RV. FINDINGS  Left Ventricle: Left ventricular ejection fraction, by estimation, is 60 to 65%. The left ventricle has normal function. The left ventricle has no regional wall motion abnormalities. Definity contrast agent was given IV to delineate the left ventricular  endocardial borders. Right Ventricle: The right ventricular size is normal. Right ventricular systolic function is normal. RIGHT ATRIUM           Index RA Area:     13.70 cm RA Volume:   30.80 ml  14.25 ml/m Jodelle Red MD Electronically signed by Jodelle Red MD Signature Date/Time: 02/03/2020/5:09:52 PM    Final     Cardiac Studies   2D echo 02/03/20 1. Left ventricular ejection fraction, by estimation, is 60 to 65%. The  left ventricle has normal function. The left ventricle has no regional  wall motion abnormalities.  2. Right ventricular systolic function is normal. The right ventricular  size is normal.   Conclusion(s)/Recommendation(s): Limted echo for function. Normal to  hyperdynamic LV EF without focal WMA. Normal RV.   Patient Profile     44 y.o. male with a hx of asthma, HTN, chronic combined CHF, NICM (normal coronaries 10/2018), alcohol/tobacco abuse, prior LLE DVT 09/2019, fatty liver and aortic atherosclerosis by CT 10/2019. He was sick about 4 weeks ago with URI (not tested for Covid at that time). 2 weeks ago he developed gradually progressive cough which worsened 1 week ago with increased  SOB, wheezing, hoarseness, and episodes of syncope/near-syncope specifically associated with "hard cough." Covid and RVP negative.  Assessment & Plan   1. Severe coughing episodes with shortness of breath - felt to reflect acute bronchitis superimposed on asthma with borderline hypoxia. BNP normal on admission and CXR clear, arguing against acute exacerbation contributing. - pulm rx per IM  2. Cough-associated syncope - 2D echocardiogram shows normal LVF/RVF without obvious cause for cardiac obstruction. Telemetry benign. - treat underlying cough - will discuss further recs with MD  3. Chronic combined CHF/NICM - previous LVEF 35-40%, improved to 45-50% by echo in 10/2019, and now normalized to 60-65%. BNP normal on admission, no evidence of volume overload.  - ARB is less likely to cause this severe cough but certainly a consideration if this persists; also was recently changed from metoprolol to carvedilol 2 months prior - appears euvolemic, continue home Lasix - stop KCl given K 4.9 - no present need for full dose ASA from cardiac standpoint, will defer to IM (this was listed as "PRN" as OP, not scheduled)  4. Essential HTN - BP controlled.  5. H/o ETOH/tobacco  - patient has been counseled on cessation and reports he's been able to cut down so far  6. Hyponatremia - ? related to pulmonary process. 131->133 this admission. - will defer to IM - if this persists consider f/u CT imaging of chest  7. Right clavicle fracture - ortho recommending non-operative management for healing.  8. H/o left leg DVT in 09/2019  - anticoag management per IM/PCP  Has f/u already scheduled with Dr. [name] for 03/18/20.  For questions or updates, please contact [facility] Please consult [email or web address] for contact info under Cardiology/STEMI.  Signed, Laurann Montana, PA-C 02/04/2020, 8:48 AM    Patient seen and examined with [facility]  Dunn PA-C.  Agree as above, with the following exceptions  and changes as noted below. No CP. Gen: NAD, CV: RRR, no murmurs, Lungs: clear, Abd: soft, Extrem: Warm, well perfused, no edema, Neuro/Psych: alert and oriented x 3, normal mood and affect. All available labs, radiology testing, previous records reviewed. EF appears normal to hyperdynamic, and has recovered from prior evaluation. No definite cardiovascular cause for caught related syncope. Will plan for outpatient follow up in cardiology, consider event monitor if syncope persists. Syncope in setting of cough is certainly concerning but no obstructive features on echo to suggest acute drop in cardiac output as the source.   Parke Poisson, MD 02/04/20 2:53 PM

## 2020-02-04 NOTE — Progress Notes (Signed)
Patient was waiting on [employer] to see if they could give him a nebulizer machine. Still waiting to hear. Patient's ride cannot wait for machine approval or to be delivered. Patient states he can come back either tonight or in am and pick it up. Melton Alar, RN

## 2020-02-05 ENCOUNTER — Telehealth: Payer: Self-pay

## 2020-02-05 NOTE — Telephone Encounter (Signed)
Transition Care Management Follow-up Telephone Call Date of discharge and from where: 02/04/2020, [facility].  Call placed to patient # [phone], phone just rings, unable to leave message.   Patient needs to schedule hospital follow up appointment with Dr Earlene Plater,

## 2020-02-06 ENCOUNTER — Telehealth: Payer: Self-pay

## 2020-02-06 NOTE — Telephone Encounter (Signed)
Transition Care Management Follow-up Telephone Call  Date of discharge and from where: 02/04/2020, [facility]   How have you been since you were released from the hospital? He said that he is okay, "doing about  the same"  Any questions or concerns? none at this time  Items Reviewed:  Did the pt receive and understand the discharge instructions provided? yes  Medications obtained and verified?  he said he has all medications including the new ones and did not have any questions about his medication regime  Any new allergies since your discharge?  none reported   Do you have support at home?  he said he has support from his mother and son.  No home health ordered  Has sling on RUE, NWB  Has nebulizer.  Functional Questionnaire: (I = Independent and D = Dependent) ADLs:indpendent   Follow up appointments reviewed:   PCP Hospital f/u appt confirmed? 02/17/2020 @ [facility] f/u appt confirmed?  cardiology - 03/18/2020, needs to schedule follow up with ortho  Are transportation arrangements needed? He said that his mother will drive him  If their condition worsens, is the pt aware to call PCP or go to the Emergency Dept.?  yes  Was the patient provided with contact information for the PCP's office or ED?  he said he can get the phone number for the clinic  Was to pt encouraged to call back with questions or concerns?  yes

## 2020-02-12 MED FILL — ALBUTEROL SUL 2.5 MG/3 ML S: (2.5 MG/3ML | 2 days supply | Qty: 75 | Fill #1

## 2020-02-12 MED FILL — BUDESONIDE 0.25 MG/2ML SUSP: 0.25 | 30 days supply | Qty: 60 | Fill #1

## 2020-02-12 MED FILL — AMLODIPINE BESYLATE 10 MG T: 10 | 30 days supply | Qty: 30 | Fill #3 | Status: TO

## 2020-02-13 ENCOUNTER — Ambulatory Visit: Payer: Self-pay | Admitting: Internal Medicine

## 2020-02-17 ENCOUNTER — Telehealth: Payer: Self-pay | Admitting: Internal Medicine

## 2020-02-21 ENCOUNTER — Encounter: Payer: Self-pay | Admitting: Internal Medicine

## 2020-02-21 ENCOUNTER — Telehealth (INDEPENDENT_AMBULATORY_CARE_PROVIDER_SITE_OTHER): Payer: Medicaid Other | Admitting: Internal Medicine

## 2020-02-21 DIAGNOSIS — I82402 Acute embolism and thrombosis of unspecified deep veins of left lower extremity: Secondary | ICD-10-CM

## 2020-02-21 DIAGNOSIS — Z09 Encounter for follow-up examination after completed treatment for conditions other than malignant neoplasm: Secondary | ICD-10-CM

## 2020-02-21 DIAGNOSIS — R55 Syncope and collapse: Secondary | ICD-10-CM

## 2020-02-21 DIAGNOSIS — J4541 Moderate persistent asthma with (acute) exacerbation: Secondary | ICD-10-CM

## 2020-02-21 MED ORDER — APIXABAN 5 MG PO TABS: 5.0000 mg | ORAL_TABLET | Freq: Two times a day (BID) | ORAL | 3 refills | Status: DC

## 2020-02-21 MED ORDER — FUROSEMIDE 40 MG PO TABS: 40.0000 mg | ORAL_TABLET | Freq: Every day | ORAL | 0 refills | Status: DC

## 2020-02-21 MED FILL — FUROSEMIDE 40 MG TAB: 40 | 30 days supply | Qty: 30 | Fill #0

## 2020-02-21 NOTE — Progress Notes (Signed)
Virtual Visit via Telephone Note  I connected with Jesus Fuller, on 02/21/2020 at 9:14 AM by telephone due to the COVID-19 pandemic and verified that I am speaking with the correct person using two identifiers.   Consent: I discussed the limitations, risks, security and privacy concerns of performing an evaluation and management service by telephone and the availability of in person appointments. I also discussed with the patient that there may be a patient responsible charge related to this service. The patient expressed understanding and agreed to proceed.   Location of Patient: Home   Location of Provider: Clinic   Persons participating in Telemedicine visit: Jesus Fuller [facility] Dr. Earlene Plater   History of Present Illness: Patient has a visit to follow up on hospitalization. Was hospitalized from 7/18-7/20 for asthma exacerbation/acute bronchitis. Had syncopal episodes related to coughing that lead to clavicular fracture. Had normal echo. LE dopplers were negative for DVTs.   He reports he is feeling better. He is using albuterol less and less---now using it 1-2x per day. Completed course of Prednisone prescribed at discharge. Compliant with Pulmicort.   Recommendations for Outpatient Follow-up:  1. Discharge to home 2. Follow up with PCP in 7-10 days. Have chemistry drawn on that visit 3. The patient is not to smoke and he is not to be around anyone that does smoke. 4. Follow up with cardiology as directed.  Hospital Course: [facility] hospitalists have been consulted to admit the patient for further evaluation and care. He is receiving IV steroids and albuterol. He is being monitored on telemetry. Cardiology has been consulted and an echocardiogram is pending. Orthopedic surgery has been consulted regarding the patient's clavicular fracture.  Echocardiogram has been performed. It has demonstrated EF of 60-65% with normal left ventricular function. There are no regional wall  motion abnormalities. The RV is normal in size and function.  Orthopedic surgery has determined that no surgical intervention is required for the patient's clavicular fracture. They have recommended no weight bearing on the right upper extremity and follow up with Dr. Eulah Fuller in 2-3 weeks.  Cardiology was consulted and felt that the patient's syncopal episode was most likely due to coughing episode. He will follow up with Dr. Cristal Fuller on 03/18/2020. If the patient has another syncopal episode, consideration should be given to an event monitor.  Doppler for DVT of the left lower extremity was negative. The patient is continued on apixaban for DVT of 09/2019.  He is cleared for discharge to home.   Past Medical History:  Diagnosis Date  . Alcoholism /alcohol abuse (HCC)   . Aortic atherosclerosis (HCC)   . Asthma   . Chest pain with normal coronary angiography 10/2018  . Chronic combined systolic and diastolic CHF (congestive heart failure) (HCC)   . Fatty liver   . Hypertension    Takes no medicine  . Left leg DVT (HCC) 09/2019  . NICM (nonischemic cardiomyopathy) (HCC)   . Tobacco abuse    No Known Allergies  Current Outpatient Medications on File Prior to Visit  Medication Sig Dispense Refill  . albuterol (PROVENTIL) (2.5 MG/3ML) 0.083% nebulizer solution Take 3 mLs (2.5 mg total) by nebulization every 2 (two) hours as needed for wheezing. 75 mL 12  . amLODipine (NORVASC) 10 MG tablet Take 1 tablet (10 mg total) by mouth daily. 30 tablet 3  . apixaban (ELIQUIS) 5 MG TABS tablet Take 1 tablet (5 mg total) by mouth 2 (two) times daily. 60 tablet 3  . aspirin  EC 325 MG tablet Take 325 mg by mouth daily as needed for moderate pain.    . budesonide (PULMICORT) 0.25 MG/2ML nebulizer solution Take 2 mLs (0.25 mg total) by nebulization 2 (two) times daily. 60 mL 12  . carvedilol (COREG) 12.5 MG tablet Take 1 tablet (12.5 mg total) by mouth 2 (two) times daily. 180 tablet 3  . famotidine  (PEPCID) 20 MG tablet Take 20 mg by mouth daily as needed for heartburn or indigestion.    . folic acid (FOLVITE) 1 MG tablet Take 1 tablet (1 mg total) by mouth daily. 30 tablet 0  . furosemide (LASIX) 40 MG tablet Take 1 tablet (40 mg total) by mouth 2 (two) times daily. 30 tablet 3  . guaiFENesin (ROBITUSSIN) 100 MG/5ML SOLN Take 5 mLs (100 mg total) by mouth every 4 (four) hours as needed for cough or to loosen phlegm. 236 mL 0  . ipratropium-albuterol (DUONEB) 0.5-2.5 (3) MG/3ML SOLN Take 3 mLs by nebulization every 6 (six) hours. 360 mL 0  . losartan (COZAAR) 50 MG tablet Take 2 tablets (100 mg total) by mouth daily. 60 tablet 3  . Multiple Vitamin (MULTIVITAMIN WITH MINERALS) TABS tablet Take 1 tablet by mouth daily. 30 tablet 0  . nicotine (NICODERM CQ - DOSED IN MG/24 HOURS) 21 mg/24hr patch Place 1 patch (21 mg total) onto the skin daily. 28 patch 0  . omeprazole (PRILOSEC OTC) 20 MG tablet Take 20 mg by mouth daily as needed (heartburn).    . potassium chloride (KLOR-CON) 10 MEQ tablet Take 1 tablet (10 mEq total) by mouth 2 (two) times daily. 60 tablet 3  . predniSONE (DELTASONE) 10 MG tablet Take 6 tablets (60 mg total) by mouth daily for 3 days, THEN 5 tablets (50 mg total) daily for 3 days, THEN 4 tablets (40 mg total) daily for 3 days, THEN 3 tablets (30 mg total) daily for 3 days, THEN 2 tablets (20 mg total) daily for 3 days, THEN 1 tablet (10 mg total) daily for 3 days. 63 tablet 0  . thiamine 100 MG tablet Take 1 tablet (100 mg total) by mouth daily. 30 tablet 0  . [DISCONTINUED] atorvastatin (LIPITOR) 40 MG tablet Take 1 tablet (40 mg total) by mouth daily at 6 PM. (Patient not taking: Reported on 03/27/2019) 30 tablet 3   No current facility-administered medications on file prior to visit.    Observations/Objective: NAD. Speaking clearly.  Work of breathing normal.  Alert and oriented. Mood appropriate.   Assessment and Plan: 1. Hospital discharge follow-up  2. Syncope,  unspecified syncope type Resolved. Likely related to coughing spells. Had negative cardiac work up.   3. Moderate persistent asthma with acute exacerbation Improving with lessened albuterol use. Discussed that cough can linger. Continue daily Pulmicort use.   4. Acute deep vein thrombosis (DVT) of left lower extremity, unspecified vein (HCC) Imaging showed that DVT has resolved with anticoagulation. Has been on therapy for about 5 months. Risk factors: smoking. However, no other risk factors or inciting events. Will plan to refer to heme in future to help guide decision making about long term anticoagulation.  - apixaban (ELIQUIS) 5 MG TABS tablet; Take 1 tablet (5 mg total) by mouth 2 (two) times daily.  Dispense: 60 tablet; Refill: 3   Follow Up Instructions: PRN and for routine medical care    I discussed the assessment and treatment plan with the patient. The patient was provided an opportunity to ask questions and all were  answered. The patient agreed with the plan and demonstrated an understanding of the instructions.   The patient was advised to call back or seek an in-person evaluation if the symptoms worsen or if the condition fails to improve as anticipated.     I provided 28 minutes total of non-face-to-face time during this encounter including median intraservice time, reviewing previous notes, investigations, ordering medications, medical decision making, coordinating care and patient verbalized understanding at the end of the visit.    Marcy Siren, D.O. Primary Care at [facility]  02/21/2020, 9:14 AM

## 2020-02-26 MED FILL — CARVEDILOL 12.5 MG TABLET: 12.5 | 30 days supply | Qty: 60 | Fill #1

## 2020-03-05 MED FILL — ALBUTEROL SUL 2.5 MG/3 ML S: (2.5 MG/3ML | 2 days supply | Qty: 75 | Fill #2

## 2020-03-05 MED FILL — POTASSIUM CHLORIDE ER 10 ME: 10 | 30 days supply | Qty: 60 | Fill #3 | Status: TO

## 2020-03-05 MED FILL — LOSARTAN POTASSIUM 50 MG TA: 50 | 30 days supply | Qty: 60 | Fill #3 | Status: TO

## 2020-03-18 ENCOUNTER — Ambulatory Visit: Payer: Self-pay | Admitting: Cardiology

## 2020-03-24 MED FILL — ALBUTEROL SUL 2.5 MG/3 ML S: (2.5 MG/3ML | 2 days supply | Qty: 75 | Fill #3

## 2020-03-25 MED FILL — CARVEDILOL 12.5 MG TABLET: 12.5 | 30 days supply | Qty: 60 | Fill #2

## 2020-04-06 MED FILL — ALBUTEROL SUL 2.5 MG/3 ML S: (2.5 MG/3ML | 2 days supply | Qty: 75 | Fill #4

## 2020-04-07 ENCOUNTER — Encounter: Payer: Self-pay | Admitting: Cardiology

## 2020-04-07 ENCOUNTER — Other Ambulatory Visit: Payer: Self-pay

## 2020-04-07 ENCOUNTER — Other Ambulatory Visit: Payer: Self-pay | Admitting: Cardiology

## 2020-04-07 ENCOUNTER — Ambulatory Visit (INDEPENDENT_AMBULATORY_CARE_PROVIDER_SITE_OTHER): Payer: Self-pay | Admitting: Cardiology

## 2020-04-07 VITALS — BP 100/72 | HR 88 | Temp 97.5°F | Ht 69.0 in | Wt 260.8 lb

## 2020-04-07 DIAGNOSIS — Z7189 Other specified counseling: Secondary | ICD-10-CM

## 2020-04-07 DIAGNOSIS — Z716 Tobacco abuse counseling: Secondary | ICD-10-CM

## 2020-04-07 DIAGNOSIS — Z86718 Personal history of other venous thrombosis and embolism: Secondary | ICD-10-CM

## 2020-04-07 DIAGNOSIS — Z79899 Other long term (current) drug therapy: Secondary | ICD-10-CM

## 2020-04-07 DIAGNOSIS — I428 Other cardiomyopathies: Secondary | ICD-10-CM

## 2020-04-07 DIAGNOSIS — R600 Localized edema: Secondary | ICD-10-CM

## 2020-04-07 DIAGNOSIS — I5042 Chronic combined systolic (congestive) and diastolic (congestive) heart failure: Secondary | ICD-10-CM

## 2020-04-07 DIAGNOSIS — I5031 Acute diastolic (congestive) heart failure: Secondary | ICD-10-CM

## 2020-04-07 MED ORDER — TORSEMIDE 20 MG PO TABS: 20.0000 mg | ORAL_TABLET | Freq: Two times a day (BID) | ORAL | 2 refills | Status: DC

## 2020-04-07 MED FILL — TORSEMIDE 20 MG TABLET: 20 | 30 days supply | Qty: 60 | Fill #0

## 2020-04-07 NOTE — Patient Instructions (Signed)
Medication Instructions:  Start Torsemide 20 mg twice a day  *If you need a refill on your cardiac medications before your next appointment, please call your pharmacy*   Lab Work: Your physician recommends that you return for lab work today ( BMP, BNP)  If you have labs (blood work) drawn today and your tests are completely normal, you will receive your results only by: Marland Kitchen MyChart Message (if you have MyChart) OR . A paper copy in the mail If you have any lab test that is abnormal or we need to change your treatment, we will call you to review the results.   Testing/Procedures: None ordered    Follow-Up: At [facility], you and your health needs are our priority.  As part of our continuing mission to provide you with exceptional heart care, we have created designated Provider Care Teams.  These Care Teams include your primary Cardiologist (physician) and Advanced Practice Providers (APPs -  Physician Assistants and Nurse Practitioners) who all work together to provide you with the care you need, when you need it.  We recommend signing up for the patient portal called "MyChart".  Sign up information is provided on this After Visit Summary.  MyChart is used to connect with patients for Virtual Visits (Telemedicine).  Patients are able to view lab/test results, encounter notes, upcoming appointments, etc.  Non-urgent messages can be sent to your provider as well.   To learn more about what you can do with MyChart, go to [email or web address].    Your next appointment:   1 month(s)  The format for your next appointment:   In Person  Provider:   Jodelle Red, MD

## 2020-04-07 NOTE — Progress Notes (Signed)
Cardiology Office Note:    Date:  04/07/2020   ID:  Jesus Fuller, DOB 1976-01-12, MRN [id number]  PCP:  Nicolette Bang, DO  Cardiologist:  Buford Dresser, MD  Referring MD: Caryl Never*   CC: follow up  History of Present Illness:    Jesus Fuller is a 44 y.o. male with a hx of chronic systolic and diastolic heart failure, nonischemic cardiomyopathy, hypertension (previously uncontrolled), alcohol/tobacco use, LLE DVT on apixaban who is seen for follow up today. I initially met him 12/05/2019 as a new consult at the request of Caryl Never* for the evaluation and management of heart failure with recent hospitalization.  He was discharged from the hospital on 11/12/19. He was seen by Dr. Doylene Canard during that hospitalization. Noted to have acute on chronic combined systolic and diastolic heart failure, dilated nonischemic cardiomyopathy, hypertension, alcohol/tobacco use during this hospitalization.  He was re-hospitalized and discharge 02/04/20, notes reviewed.  Today: Generally doing well, but has been dealing with bronchitis for several months. Discharged 02/04/20 after syncope from coughing, fell and broke his clavicle.  Weight is 260 lbs, normal for him is 190-200 lbs in the past, was 220 lbs more recently. Weight earlier this year was 230 lbs. Thinks his weight went up since starting prednisone. Feels that his legs are very swollen and painful. Has stretch marks on his stomach. Avoiding salt, watching fluids. Fluid is better in the AM but worse later in the day.   Has only been taking losartan, carvedilol, potassium only recently. Has been off of lasix for about a month. Not taking amlodipine as he read this can affect his swelling.  Told apixaban was done, finished a while ago. Takes aspirin PRN for muscle pain.  Still smoking, has cut back. Still using patches to quit.  Drinking liquor, a pint or more/multiple days per week. Discussed  cessation. Recently had DUI driving from the bar.  Reviewed recent echo, EF 60-65% 02/03/20 (limited for function). Normal RV. U/S negative for DVT.   Past Medical History:  Diagnosis Date  . Alcoholism /alcohol abuse ([place])   . Aortic atherosclerosis ([place])   . Asthma   . Chest pain with normal coronary angiography 10/2018  . Chronic combined systolic and diastolic CHF (congestive heart failure) ([place])   . Fatty liver   . Hypertension    Takes no medicine  . Left leg DVT ([place]) 09/2019  . NICM (nonischemic cardiomyopathy) ([place])   . Tobacco abuse     Past Surgical History:  Procedure Laterality Date  . RIGHT/LEFT HEART CATH AND CORONARY ANGIOGRAPHY N/A 10/24/2018   Procedure: RIGHT/LEFT HEART CATH AND CORONARY ANGIOGRAPHY;  Surgeon: Dixie Dials, MD;  Location: [facility];  Service: Cardiovascular;  Laterality: N/A;    Current Medications: Current Outpatient Medications on File Prior to Visit  Medication Sig  . albuterol (PROVENTIL) (2.5 MG/3ML) 0.083% nebulizer solution Take 3 mLs (2.5 mg total) by nebulization every 2 (two) hours as needed for wheezing.  Marland Kitchen amLODipine (NORVASC) 10 MG tablet Take 1 tablet (10 mg total) by mouth daily.  Marland Kitchen apixaban (ELIQUIS) 5 MG TABS tablet Take 1 tablet (5 mg total) by mouth 2 (two) times daily.  Marland Kitchen aspirin EC 325 MG tablet Take 325 mg by mouth daily as needed for moderate pain.  . budesonide (PULMICORT) 0.25 MG/2ML nebulizer solution Take 2 mLs (0.25 mg total) by nebulization 2 (two) times daily.  . famotidine (PEPCID) 20 MG tablet Take 20 mg by mouth daily  as needed for heartburn or indigestion.  . folic acid (FOLVITE) 1 MG tablet Take 1 tablet (1 mg total) by mouth daily.  Marland Kitchen guaiFENesin (ROBITUSSIN) 100 MG/5ML SOLN Take 5 mLs (100 mg total) by mouth every 4 (four) hours as needed for cough or to loosen phlegm.  Marland Kitchen ipratropium-albuterol (DUONEB) 0.5-2.5 (3) MG/3ML SOLN Take 3 mLs by nebulization every 6 (six) hours.  Marland Kitchen losartan (COZAAR) 50 MG  tablet Take 2 tablets (100 mg total) by mouth daily.  . Multiple Vitamin (MULTIVITAMIN WITH MINERALS) TABS tablet Take 1 tablet by mouth daily.  . nicotine (NICODERM CQ - DOSED IN MG/24 HOURS) 21 mg/24hr patch Place 1 patch (21 mg total) onto the skin daily.  Marland Kitchen omeprazole (PRILOSEC OTC) 20 MG tablet Take 20 mg by mouth daily as needed (heartburn).  . potassium chloride (KLOR-CON) 10 MEQ tablet Take 1 tablet (10 mEq total) by mouth 2 (two) times daily.  Marland Kitchen thiamine 100 MG tablet Take 1 tablet (100 mg total) by mouth daily.  . carvedilol (COREG) 12.5 MG tablet Take 1 tablet (12.5 mg total) by mouth 2 (two) times daily.  . furosemide (LASIX) 40 MG tablet Take 1 tablet (40 mg total) by mouth daily.  . [DISCONTINUED] atorvastatin (LIPITOR) 40 MG tablet Take 1 tablet (40 mg total) by mouth daily at 6 PM. (Patient not taking: Reported on 03/27/2019)   No current facility-administered medications on file prior to visit.     Allergies:   Patient has no known allergies.   Social History   Tobacco Use  . Smoking status: Current Every Day Smoker    Packs/day: 1.50    Types: Cigarettes  . Smokeless tobacco: Never Used  Vaping Use  . Vaping Use: Never used  Substance Use Topics  . Alcohol use: Yes    Comment: occasional  . Drug use: No    Family History: family history includes COPD in his mother; Cancer in his mother; Diabetes in his father.  ROS:   Please see the history of present illness.  Additional pertinent ROS otherwise unremarkable.  EKGs/Labs/Other Studies Reviewed:    The following studies were reviewed today: Echo 11/11/19  1. Left ventricular ejection fraction, by estimation, is 45 to 50%. The  left ventricle has mildly decreased function. The left ventricle  demonstrates regional wall motion abnormalities (see scoring  diagram/findings for description). Left ventricular  diastolic parameters are consistent with Grade II diastolic dysfunction  (pseudonormalization). There is  mild hypokinesis of the left ventricular,  basal anterior wall, septal wall and inferior wall.  2. Right ventricular systolic function is mildly reduced. The right  ventricular size is mildly enlarged.  3. Left atrial size was moderately dilated.  4. Right atrial size was moderately dilated.  5. The mitral valve is normal in structure. Trivial mitral valve  regurgitation.  6. The aortic valve is normal in structure. Aortic valve regurgitation is  not visualized.  7. The inferior vena cava is normal in size with greater than 50%  respiratory variability, suggesting right atrial pressure of 3 mmHg.   Echo 10/25/18 1. Mild hypokinesis of the left ventricular, entire anteroseptal wall and  inferolateral wall.  2. The left ventricle has moderately reduced systolic function, with an  ejection fraction of 35-40%. The cavity size was normal. Left ventricular  diastolic Doppler parameters are consistent with impaired relaxation.  3. The right ventricle has normal systolic function. The cavity was  normal. There is no increase in right ventricular wall thickness.  R/LHC 10/24/18 Normal cors, normal LVEDP. CO 5.3, CI 2.61, RA mean 0, PA 21/7 (13). Wedge mean 1. LVEDP 4. Ao 122/81.  EKG:  EKG is personally reviewed.  The ekg ordered 02/02/20 demonstrates NSR at 91 bpm  Recent Labs: 11/11/2019: Magnesium 1.7; TSH 2.775 02/02/2020: ALT 16; B Natriuretic Peptide 30.5 02/03/2020: BUN 30; Creatinine, Ser 1.14; Hemoglobin 13.1; Platelets 236; Potassium 4.9; Sodium 133  Recent Lipid Panel    Component Value Date/Time   CHOL 177 10/24/2018 0832   TRIG 122 10/24/2018 0832   HDL 63 10/24/2018 0832   CHOLHDL 2.8 10/24/2018 0832   VLDL 24 10/24/2018 0832   LDLCALC 90 10/24/2018 0832    Physical Exam:    VS:  BP 100/72   Pulse 88   Temp (!) 97.5 F (36.4 C)   Ht _0  (1.753 m)   Wt 260 lb 12.8 oz (118.3 kg)   SpO2 90%   BMI 38.51 kg/m     Wt Readings from Last 3 Encounters:  04/07/20  260 lb 12.8 oz (118.3 kg)  02/04/20 222 lb 4.8 oz (100.8 kg)  12/05/19 232 lb (105.2 kg)    GEN: Well nourished, well developed in no acute distress HEENT: Normal, moist mucous membranes NECK: No JVD CARDIAC: regular rhythm, normal S1 and S2, no rubs or gallops. No murmur. VASCULAR: Radial and DP pulses 2+ bilaterally. No carotid bruits RESPIRATORY:  Wheezing in lungs bilaterally ABDOMEN: Soft, non-tender, non-distended. Striae present MUSCULOSKELETAL:  Ambulates independently SKIN: Warm and dry. 2+ firm edema to hips bilaterally NEUROLOGIC:  Alert and oriented x 3. No focal neuro deficits noted. PSYCHIATRIC:  Normal affect .   ASSESSMENT:    1. Acute diastolic heart failure ([place])   2. Chronic combined systolic and diastolic heart failure ([place])   3. NICM (nonischemic cardiomyopathy) ([place])   4. History of deep venous thrombosis (DVT) of distal vein of left lower extremity   5. Tobacco abuse counseling   6. Encounter for education about heart failure   7. Bilateral leg edema   8. Medication management    PLAN:    Acute diastolic heart failure: likely exacerbated by prednisone and stopping furosemide. -given leg/abdominal edema, start torsemide 20 mg BID -check BNP, BMET today -counseled on closely weight monitoring and symptom monitoring -reviewed heart failure education  Chronic combined systolic and diastolic heart failure, nonischemic cardiomyopathy: recent EF normalized. -continue management of risk factors, including hypertension, tobacco, and alcohol use -continue losartan 100 mg daily -continue carvedilol 12.5 mg BID  Cough syncope: -reviewed notes from recent hospitalization -discussed tobacco cessation, below  Hypertension: -goal <130/80 -running low today, though asymptomatic -continue carvedilol, losartan as above -starting torsemide as above -typically BP runs high, but if continues to be low would stop amlodipine  History of DVT of lower extremity:  diagnosed during hospitalization 10/2019 -was given 3 mos of apixaban, reports that he has completed this  Tobacco abuse counseling: The patient was counseled on tobacco cessation today for 4 minutes.  Counseling included reviewing the risks of smoking tobacco products, how it impacts the patient's current medical diagnoses and different strategies for quitting.  Pharmacotherapy to aid in tobacco cessation was not prescribed today.  Cardiac risk counseling and prevention recommendations: -recommend heart healthy/Mediterranean diet, with whole grains, fruits, vegetable, fish, lean meats, nuts, and olive oil. Limit salt. -recommend moderate walking, 3-5 times/week for 30-50 minutes each session. Aim for at least 150 minutes.week. Goal should be pace of 3 miles/hours, or walking 1.5 miles in  30 minutes -recommend avoidance of tobacco products. Avoid excess alcohol. -Additional risk factor control:  -Diabetes risk: A1c is not available, denies history  -Lipids: per [facility], Tchol 177, HFL 63, LDL 90, TG 122. Continue atorvastatin  -Blood pressure control: as above  -Weight: BMI 34, discussed lifestyle today -ASCVD risk score: The ASCVD Risk score Mikey Bussing DC Jr., et al., 2013) failed to calculate for the following reasons:   The patient has a prior MI or stroke diagnosis    Plan for follow up: 1 month  Buford Dresser, MD, PhD [place]  [facility]    Medication Adjustments/Labs and Tests Ordered: Current medicines are reviewed at length with the patient today.  Concerns regarding medicines are outlined above.  Orders Placed This Encounter  Procedures  . Basic metabolic panel  . Brain natriuretic peptide   Meds ordered this encounter  Medications  . torsemide (DEMADEX) 20 MG tablet    Sig: Take 1 tablet (20 mg total) by mouth 2 (two) times daily.    Dispense:  60 tablet    Refill:  2    Patient Instructions  Medication Instructions:  Start Torsemide 20 mg twice a day  *If  you need a refill on your cardiac medications before your next appointment, please call your pharmacy*   Lab Work: Your physician recommends that you return for lab work today ( BMP, BNP)  If you have labs (blood work) drawn today and your tests are completely normal, you will receive your results only by: Marland Kitchen MyChart Message (if you have MyChart) OR . A paper copy in the mail If you have any lab test that is abnormal or we need to change your treatment, we will call you to review the results.   Testing/Procedures: None ordered    Follow-Up: At [facility], you and your health needs are our priority.  As part of our continuing mission to provide you with exceptional heart care, we have created designated Provider Care Teams.  These Care Teams include your primary Cardiologist (physician) and Advanced Practice Providers (APPs -  Physician Assistants and Nurse Practitioners) who all work together to provide you with the care you need, when you need it.  We recommend signing up for the patient portal called "MyChart".  Sign up information is provided on this After Visit Summary.  MyChart is used to connect with patients for Virtual Visits (Telemedicine).  Patients are able to view lab/test results, encounter notes, upcoming appointments, etc.  Non-urgent messages can be sent to your provider as well.   To learn more about what you can do with MyChart, go to [email or web address].    Your next appointment:   1 month(s)  The format for your next appointment:   In Person  Provider:   Buford Dresser, MD      Signed, Buford Dresser, MD PhD 04/07/2020  [place]

## 2020-04-20 MED FILL — ALBUTEROL SUL 2.5 MG/3 ML S: (2.5 MG/3ML | 6 days supply | Qty: 225 | Fill #5

## 2020-04-22 MED FILL — CARVEDILOL 12.5 MG TABLET: 12.5 | 30 days supply | Qty: 60 | Fill #3

## 2020-05-07 MED FILL — ALBUTEROL SUL 2.5 MG/3 ML S: (2.5 MG/3ML | 6 days supply | Qty: 225 | Fill #6

## 2020-05-07 MED FILL — TORSEMIDE 20 MG TABLET: 20 | 30 days supply | Qty: 60 | Fill #1

## 2020-05-08 ENCOUNTER — Other Ambulatory Visit: Payer: Self-pay

## 2020-05-08 ENCOUNTER — Ambulatory Visit (INDEPENDENT_AMBULATORY_CARE_PROVIDER_SITE_OTHER): Payer: Self-pay | Admitting: Cardiology

## 2020-05-08 ENCOUNTER — Encounter: Payer: Self-pay | Admitting: Cardiology

## 2020-05-08 VITALS — BP 131/86 | HR 88 | Ht 69.0 in | Wt 255.4 lb

## 2020-05-08 DIAGNOSIS — Z720 Tobacco use: Secondary | ICD-10-CM

## 2020-05-08 DIAGNOSIS — Z716 Tobacco abuse counseling: Secondary | ICD-10-CM

## 2020-05-08 DIAGNOSIS — I428 Other cardiomyopathies: Secondary | ICD-10-CM

## 2020-05-08 DIAGNOSIS — R55 Syncope and collapse: Secondary | ICD-10-CM

## 2020-05-08 DIAGNOSIS — I5042 Chronic combined systolic (congestive) and diastolic (congestive) heart failure: Secondary | ICD-10-CM

## 2020-05-08 DIAGNOSIS — R600 Localized edema: Secondary | ICD-10-CM

## 2020-05-08 DIAGNOSIS — I10 Essential (primary) hypertension: Secondary | ICD-10-CM

## 2020-05-08 NOTE — Patient Instructions (Signed)
Medication Instructions:  Your Physician recommend you continue on your current medication as directed.    *If you need a refill on your cardiac medications before your next appointment, please call your pharmacy*   Lab Work: None ordered  Testing/Procedures: None ordered    Follow-Up: At [facility], you and your health needs are our priority.  As part of our continuing mission to provide you with exceptional heart care, we have created designated Provider Care Teams.  These Care Teams include your primary Cardiologist (physician) and Advanced Practice Providers (APPs -  Physician Assistants and Nurse Practitioners) who all work together to provide you with the care you need, when you need it.  We recommend signing up for the patient portal called "MyChart".  Sign up information is provided on this After Visit Summary.  MyChart is used to connect with patients for Virtual Visits (Telemedicine).  Patients are able to view lab/test results, encounter notes, upcoming appointments, etc.  Non-urgent messages can be sent to your provider as well.   To learn more about what you can do with MyChart, go to [email or web address].    Your next appointment:   1 month(s)  The format for your next appointment:   Virtual Visit   Provider:   Jodelle Red, MD
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
## 2020-05-11 ENCOUNTER — Telehealth: Payer: Self-pay | Admitting: Licensed Clinical Social Worker

## 2020-05-11 NOTE — Telephone Encounter (Signed)
CSW received referral to assist patient with insurance options and possible medicaid. CSW attempted to contact patient with no answer and unable to leave message. CSW will attempt again tomorrow. Lasandra Beech, LCSW, CCSW-MCS [phone]

## 2020-05-13 ENCOUNTER — Telehealth: Payer: Self-pay | Admitting: Licensed Clinical Social Worker

## 2020-05-13 NOTE — Telephone Encounter (Signed)
Jesus Fuller with [employer] confirms pt had been screened out for [facility] but may meet criteria for further [facility] Assistance support. CSW sent assistance application through to pt and also offered to mail pt a copy to complete as well.  Octavio Graves, MSW, LCSW [facility] Clinical Social Work

## 2020-05-13 NOTE — Telephone Encounter (Signed)
CSW was able to reach pt via telephone at [phone]. Introduced self, role, reason for call. Pt confirmed home address, PCP and that his mother is a good contact. Pt is aware he has Medicaid [employer], he states he applied for it but it hasn't covered  the additional f/u care he needs. Pt gets his medications filled at [facility] and is seen at [facility]. He states that he has been in touch with [facility] Counseling department and has applied for disability but determination is still pending at this time. Pt states that he has good support from his family who live around him, he would like to be able to get back to work and find a way to better manage his cough.   CSW let pt know that we were not previously able to leave a message as his voicemail was not set up- pt aware, he shares he gets text messages and emails at [email or web address].   Pt confirms I may call or send any additional aid assistance through email to the address above.  I have also reached out to [facility] Counselor Frances Maywood to confirm that someone has indeed been working on assistance for pt.   Octavio Graves, MSW, LCSW [facility] Clinical Social Work

## 2020-05-15 ENCOUNTER — Encounter: Payer: Self-pay | Admitting: Cardiology

## 2020-05-24 ENCOUNTER — Encounter: Payer: Self-pay | Admitting: Cardiology

## 2020-05-25 ENCOUNTER — Other Ambulatory Visit: Payer: Self-pay

## 2020-05-25 ENCOUNTER — Encounter: Payer: Self-pay | Admitting: Internal Medicine

## 2020-05-25 ENCOUNTER — Ambulatory Visit (INDEPENDENT_AMBULATORY_CARE_PROVIDER_SITE_OTHER): Payer: Self-pay | Admitting: Internal Medicine

## 2020-05-25 VITALS — BP 120/82 | HR 83 | Ht 69.0 in | Wt 254.0 lb

## 2020-05-25 DIAGNOSIS — I10 Essential (primary) hypertension: Secondary | ICD-10-CM

## 2020-05-25 DIAGNOSIS — Z86718 Personal history of other venous thrombosis and embolism: Secondary | ICD-10-CM

## 2020-05-25 DIAGNOSIS — Z720 Tobacco use: Secondary | ICD-10-CM

## 2020-05-25 DIAGNOSIS — I5032 Chronic diastolic (congestive) heart failure: Secondary | ICD-10-CM

## 2020-05-25 DIAGNOSIS — Z006 Encounter for examination for normal comparison and control in clinical research program: Secondary | ICD-10-CM

## 2020-05-25 DIAGNOSIS — R55 Syncope and collapse: Secondary | ICD-10-CM

## 2020-05-25 HISTORY — PX: OTHER SURGICAL HISTORY: SHX169

## 2020-05-25 LAB — PRO B NATRIURETIC PEPTIDE: NT-Pro BNP: 409 pg/mL — ABNORMAL HIGH (ref 0–86)

## 2020-05-25 LAB — BASIC METABOLIC PANEL
BUN/Creatinine Ratio: 11 (ref 9–20)
BUN: 12 mg/dL (ref 6–24)
CO2: 29 mmol/L (ref 20–29)
Calcium: 9.4 mg/dL (ref 8.7–10.2)
Chloride: 93 mmol/L — ABNORMAL LOW (ref 96–106)
Creatinine, Ser: 1.05 mg/dL (ref 0.76–1.27)
GFR calc Af Amer: 101 mL/min/{1.73_m2} (ref >59)
GFR calc non Af Amer: 87 mL/min/{1.73_m2} (ref >59)
Glucose: 93 mg/dL (ref 65–99)
Potassium: 3.3 mmol/L — ABNORMAL LOW (ref 3.5–5.2)
Sodium: 141 mmol/L (ref 134–144)

## 2020-05-25 LAB — CBC
Hematocrit: 48.5 % (ref 37.5–51.0)
Hemoglobin: 16.9 g/dL (ref 13.0–17.7)
MCH: 36.7 pg — ABNORMAL HIGH (ref 26.6–33.0)
MCHC: 34.8 g/dL (ref 31.5–35.7)
MCV: 105 fL — ABNORMAL HIGH (ref 79–97)
Platelets: 185 10*3/uL (ref 150–450)
RBC: 4.61 x10E6/uL (ref 4.14–5.80)
RDW: 13 % (ref 11.6–15.4)
WBC: 8.7 10*3/uL (ref 3.4–10.8)

## 2020-05-25 NOTE — Patient Instructions (Addendum)
Medication Instructions:  Your physician recommends that you continue on your current medications as directed. Please refer to the Current Medication list given to you today.  Labwork: CBC, BMP, Pro BNP  Testing/Procedures: None ordered.  Your physician wants you to follow-up in: 6 months with Dr. Johney Frame.  You will receive a reminder letter in the mail two months in advance. If you don't receive a letter, please call our office to schedule the follow-up appointment.    Implantable Loop Recorder Placement, Care After This sheet gives you information about how to care for yourself after your procedure. Your health care provider may also give you more specific instructions. If you have problems or questions, contact your health care provider. What can I expect after the procedure? After the procedure, it is common to have:  Soreness or discomfort near the incision.  Some swelling or bruising near the incision.  Follow these instructions at home: Incision care  1.  Leave your outer dressing on for 24 hours.  After 24 hours you can remove your outer dressing and shower. 2. Leave adhesive strips in place. These skin closures may need to stay in place for 1-2 weeks. If adhesive strip edges start to loosen and curl up, you may trim the loose edges.  You may remove the strips if they have not fallen off after 2 weeks. 3. Check your incision area every day for signs of infection. Check for: a. Redness, swelling, or pain. b. Fluid or blood. c. Warmth. d. Pus or a bad smell. 4. Do not take baths, swim, or use a hot tub until your incision is completely healed. 5. If your wound site starts to bleed apply pressure.      If you have any questions/concerns please call the device clinic at [phone].  Activity  Return to your normal activities.  General instructions  Follow instructions from your health care provider about how to manage your implantable loop recorder and transmit the  information. Learn how to activate a recording if this is necessary for your type of device.  Do not go through a metal detection gate, and do not let someone hold a metal detector over your chest. Show your ID card.  Do not have an MRI unless you check with your health care provider first.  Take over-the-counter and prescription medicines only as told by your health care provider.  Keep all follow-up visits as told by your health care provider. This is important. Contact a health care provider if:  You have redness, swelling, or pain around your incision.  You have a fever.  You have pain that is not relieved by your pain medicine.  You have triggered your device because of fainting (syncope) or because of a heartbeat that feels like it is racing, slow, fluttering, or skipping (palpitations). Get help right away if you have:  Chest pain.  Difficulty breathing. Summary  After the procedure, it is common to have soreness or discomfort near the incision.  Change your dressing as told by your health care provider.  Follow instructions from your health care provider about how to manage your implantable loop recorder and transmit the information.  Keep all follow-up visits as told by your health care provider. This is important. This information is not intended to replace advice given to you by your health care provider. Make sure you discuss any questions you have with your health care provider. Document Released: 06/15/2015 Document Revised: 08/19/2017 Document Reviewed: 08/19/2017 Elsevier Patient Education  2020  [employer].

## 2020-05-25 NOTE — Progress Notes (Signed)
Electrophysiology Office Note   Date:  05/25/2020   ID:  Jesus Fuller, DOB May 21, 1976, MRN [id number]  PCP:  Arvilla Market, DO  Cardiologist:  Dr [name] Primary Electrophysiologist: Hillis Range, MD    CC: syncope and CHF   History of Present Illness: Jesus Fuller is a 44 y.o. male who presents today for electrophysiology evaluation.   The patient is referred for EP consultation. He has a h/o CHF.  He presented 10/2018 with CHF symptoms and had cath which showed normal cors.  EF 35-40% at that time.  With aggressive lifestyle modification, his EF has improved to 60%. He was admitted 01/2020 with acute decompensated CHF.  He received IV lasix in the ER 02/02/20 and was also treated with IV steroids for asthmatic bronchitis. He has made slow progress since that time. With diuresis, he has lost weight and his edema ha improved.  He continues to smoke and has SOB. He also has post tussive syncope. + orthopnea. PND Today, he denies symptoms of palpitations, chest pain,  dizziness, presyncope, or neurologic sequela. The patient is tolerating medications without difficulties and is otherwise without complaint today.    Past Medical History:  Diagnosis Date  . Alcoholism /alcohol abuse   . Aortic atherosclerosis (HCC)   . Asthma   . Chest pain with normal coronary angiography 10/2018  . Chronic combined systolic and diastolic CHF (congestive heart failure) (HCC)   . Fatty liver   . Hypertension    Takes no medicine  . Left leg DVT (HCC) 09/2019  . NICM (nonischemic cardiomyopathy) (HCC)   . Tobacco abuse    Past Surgical History:  Procedure Laterality Date  . RIGHT/LEFT HEART CATH AND CORONARY ANGIOGRAPHY N/A 10/24/2018   Procedure: RIGHT/LEFT HEART CATH AND CORONARY ANGIOGRAPHY;  Surgeon: Orpah Cobb, MD;  Location: [facility];  Service: Cardiovascular;  Laterality: N/A;     Current Outpatient Medications  Medication Sig Dispense Refill  . albuterol  (PROVENTIL) (2.5 MG/3ML) 0.083% nebulizer solution Take 3 mLs (2.5 mg total) by nebulization every 2 (two) hours as needed for wheezing. 75 mL 12  . aspirin EC 325 MG tablet Take 325 mg by mouth daily as needed for moderate pain.    Marland Kitchen torsemide (DEMADEX) 20 MG tablet Take 1 tablet (20 mg total) by mouth 2 (two) times daily. 60 tablet 2  . carvedilol (COREG) 12.5 MG tablet Take 1 tablet (12.5 mg total) by mouth 2 (two) times daily. 180 tablet 3   No current facility-administered medications for this visit.    Allergies:   Patient has no known allergies.   Social History:  The patient  reports that he has been smoking cigarettes. He has been smoking about 1.50 packs per day. He has never used smokeless tobacco. He reports current alcohol use. He reports that he does not use drugs.   Family History:  The patient's family history includes COPD in his mother; Cancer in his mother; Diabetes in his father.    ROS:  Please see the history of present illness.   All other systems are personally reviewed and negative.    PHYSICAL EXAM: VS:  BP 120/82   Pulse 83   Ht 5\' 9"  (1.753 m)   Wt 254 lb (115.2 kg)   SpO2 93%   BMI 37.51 kg/m  , BMI Body mass index is 37.51 kg/m. GEN: Well nourished, well developed, in no acute distress HEENT: normal Neck: no JVD, carotid bruits, or masses Cardiac:  RRR; no murmurs, rubs, or gallops,no edema  Respiratory:  clear to auscultation bilaterally, normal work of breathing GI: soft, nontender, nondistended, + BS MS: no deformity or atrophy Skin: warm and dry  Neuro:  Strength and sensation are intact Psych: euthymic mood, full affect  EKG:  EKG is ordered today. The ekg ordered today is personally reviewed and shows sinus rhythm 83 bpm, PR 138 msec, QRS 86 msec, Qtc 437 msec   Recent Labs: 11/11/2019: Magnesium 1.7; TSH 2.775 02/02/2020: ALT 16; B Natriuretic Peptide 30.5 02/03/2020: BUN 30; Creatinine, Ser 1.14; Hemoglobin 13.1; Platelets 236; Potassium  4.9; Sodium 133  personally reviewed   Lipid Panel     Component Value Date/Time   CHOL 177 10/24/2018 0832   TRIG 122 10/24/2018 0832   HDL 63 10/24/2018 0832   CHOLHDL 2.8 10/24/2018 0832   VLDL 24 10/24/2018 0832   LDLCALC 90 10/24/2018 0832   personally reviewed   Wt Readings from Last 3 Encounters:  05/25/20 254 lb (115.2 kg)  05/08/20 255 lb 6.4 oz (115.8 kg)  04/07/20 260 lb 12.8 oz (118.3 kg)      Other studies personally reviewed: Additional studies/ records that were reviewed today include: prior echos, ecgs, Dr Gwendlyn Deutscher notes, recent hospital records  Review of the above records today demonstrates: as above   ASSESSMENT AND PLAN:  1.  Chronic diastolic dysfunction NYHA Class III CHF with ongoing edema, SOB, and orthopnea. He is followed closely by Dr [name]. Today, we discussed Alleviate HF trial at length.  I feel that he is a good candidate. We discussed risks and benefits to ILR implant at length today.  He understands that risks include but are not limited to bleeding and infection and wishes to proceed at this time.  2. Post tussive syncope We will follow with ILR Smoking cessation and aggressive treatment of asthmatic bronchitis are the recommended treatment at this time.  3. Tobacco Cessation is advised.  4. Prior DVT Follows with Dr [name] On ASA  5. HTN Stable No change required today  6. Obesity Body mass index is 37.51 kg/m. Lifestyle modification is advised  Risks, benefits and potential toxicities for medications prescribed and/or refilled reviewed with patient today.   Follow-up:  Return in 6 months to see me or Dr Alfonso Ellis  Current medicines are reviewed at length with the patient today.   The patient does not have concerns regarding his medicines.  The following changes were made today:  none  Labs/ tests ordered today include:  No orders of the defined types were placed in this encounter.    Jesus Idol, MD  05/25/2020 10:20 AM     [facility] [address] [place] [address] [phone] (office) [phone] (fax)       PROCEDURES:   1. Implantable loop recorder implantation      DESCRIPTION OF PROCEDURE:  Informed written consent was obtained.  The patient required no sedation for the procedure today.  The patients left chest was prepped and draped. Mapping over the patient's chest was performed to identify the appropriate ILR site.  This area was found to be the left  parasternal region over the 3rd-4th intercostal space.  The skin overlying this region was infiltrated with lidocaine for local analgesia.  A 0.5-cm incision was made at the implant site.  A subcutaneous ILR pocket was fashioned using a combination of sharp and blunt dissection.  A Medtronic Reveal Linq model [id number] (SN [id number]) implantable loop  recorder was then placed into the pocket R waves were very prominent and measured > 0.2 mV. EBL<1 ml.  Steri- Strips and a sterile dressing were then applied.  There were no early apparent complications.     CONCLUSIONS:   1. Successful implantation of a Medtronic Reveal LINQ implantable loop recorder for ALLEVIATE HF study protocol  2. No early apparent complications.   Hillis Range MD, [facility] 05/25/2020 10:44 AM

## 2020-05-25 NOTE — Research (Signed)
Alleviate Research Study  Device Insertion  Alleviate Informed Consent   Subject Name: Jesus Fuller  Subject met inclusion and exclusion criteria.  The informed consent form, study requirements and expectations were reviewed with the subject and questions and concerns were addressed prior to the signing of the consent form.  The subject verbalized understanding of the trial requirements.  The subject agreed to participate in the Alleviate trial and signed the informed consent at 1000 on 05/25/2020.  The informed consent was obtained prior to performance of any protocol-specific procedures for the subject.  A copy of the signed informed consent was given to the subject and a copy was placed in the subject's medical record.    Current Outpatient Medications:  .  albuterol (PROVENTIL) (2.5 MG/3ML) 0.083% nebulizer solution, Take 3 mLs (2.5 mg total) by nebulization every 2 (two) hours as needed for wheezing., Disp: 75 mL, Rfl: 12 .  aspirin EC 325 MG tablet, Take 325 mg by mouth daily as needed for moderate pain., Disp: , Rfl:  .  torsemide (DEMADEX) 20 MG tablet, Take 1 tablet (20 mg total) by mouth 2 (two) times daily., Disp: 60 tablet, Rfl: 2 .  carvedilol (COREG) 12.5 MG tablet, Take 1 tablet (12.5 mg total) by mouth 2 (two) times daily., Disp: 180 tablet, Rfl: 3   [id number]  MOBILITY:    I HAVE NO PROBLEMS WALKING []  I HAVE SLIGHT PROBLEMS WALKING []  I HAVE MODERATE PROBLEMS WALKING [x]  I HAVE SEVERE PROBLEMS WALKING []  I AM UNABLE TO WALK  []    SELF-CARE:   I HAVE NO PROBLEMS WASHING OR DRESSING MYSELF  []  I HAVE SLIGHT PROBLEMS WASHING OR DRESSING MYSELF  []  I HAVE MODERATE PROBLEMS WASHING OR DRESSING MYSELF [x]  I HAVE SEVERE PROBLEMS WASHING OR DRESSING MYSELF  []  I HAVE SEVERE PROBLEMS WASHING OR DRESSING MYSELF  []  I AM UNABLE TO [place] OR DRESS MYSELF []    USUAL ACTIVITIES: (E.G. WORK/STUDY/HOUSEWORK/FAMILY OR LEISURE ACTIVITIES.    I HAVE NO PROBLEMS DOING MY  USUAL ACTIVITIES []  I HAVE SLIGHT PROBLEMS DOING MY USUAL ACTIVITIES []  I HAVE MODERATE PROBLEMS DOING MY USUAL ACTIVIITIES [x]  I HAVE SEVERE PROBLEMS DOING MY USUAL ACTIVITIES []  I AM UNABLE TO DO MY USUAL ACTIVITIES []    PAIN /DISCOMFORT   I HAVE NO PAIN OR DISCOMFORT []  I HAVE SLIGHT PAIN OR DISCOMFORT []  I HAVE MODERATE PAIN OR DISCOMFORT [x]  I HAVE SEVERE PAIN OR DISCOMFORT []  I HAVE EXTREME PAIN OR DISCOMFORT []    ANXIETY/DEPRESSION   I AM NOT ANXIOUS OR DEPRESSED []  I AM SLIGHTLY ANXIOUS OR DEPRESSED [x]  I AM MODERATELY ANXIOUS OR DREPRESSED []  I AM SEVERELY ANXIOUS OR DEPRESSED []  I AM EXTREMELY ANXIOUS OR DEPRESSED []    SCALE OF 0-100 HOW WOULD YOU RATE TODAY?  0 IS THE WORSE AND 100 IS THE BEST HEALTH YOU CAN IMAGINE: 43    [facility]  The following questions refer to your heart failure and how it may affect your life. Please read and complete the following questions. There are no right or wrong answers. Please mark the answer that best applies to you.  1. Heart Failure affects different people in different ways. Some feel shortness of breath while others feel fatigue. Please indicate how much you are limited by your Heart Failure ( shortness or breath or fatigue) in your ability  to do the following activities over the past 2 weeks.  Activity Extermely Limited Quite a bit Limited Moderately Limited Slightly Limited Not at all Limited Limited other reasons or did not do the activity  Dressing yourself [] [] [x] [] [] []  Showering/Bathing [] [x] [] [] [] []  Walking 1 block on level ground [] [x] [] [] [] []  Doing yardwork, housework, or carrying groceries  []  []  [x]  []  []  []  Climbing a flight of stairs without stopping  []   [x]  []  []  []  []  Hurrying or jogging (as if to catch a bus)  []  [x]  []  []  []  []    2. Compared with 2 weeks ago, have your symptoms of heart failure (shortness of breath, fatigue or ankle swelling)  changed?                   My symptoms of heart failure have become ..  Much Worse  Slight worse  Not changed  Slight better  Much better I've had no symptoms over the last 2 weeks  [] [] [x] [] [] []             3. Over the past 2 weeks, how many times did you have swelling in your feet, ankles, or legs when you woke up in the morning?   Every morning 3 or more times a week, but not every day 1-2 times a week Less than once a week Never over the past 2 weeks  [] [x] [] [] []   4. Over the past 2 weeks how much has swelling in your feet, ankles, or legs bothered you? Extremely bothersome Quite a bit bothersome Moderately bothersome Slight bothersome  Not at all bothersome I've had no swelling  [] [] [x] [] [] []   5. Over the past 2 weeks, on average, how many times has fatigue limited your ability to do what you want? All of the time Several times per day At least once a day 3 or more times a week but not every day 1-2 times per week Less than once a week Never over the past 2 weeks  [] [] [x] [] [] [] []   6.     Over the past 2 weeks, how much has your fatigue bothered you? Extremely bothersome Quite a bit bothersome Moderately bothersome Slight bothersome  Not at all bothersome I've had no swelling  [] [x] [] [] [] []   7.      Over the past 2 weeks, on average, how many times has shortness of breath limited your ability to do what you want? All of the time Several times per day At least once a day 3 or more times a week but not every day 1-2 times per week Less than once a week Never over the past 2 weeks  [] [] [] [x] [] [] []   8.        Over the past 2 weeks, has shortness of breath bothered you? Extremely bothersome Quite a bit bothersome Moderately bothersome Slight bothersome  Not at all bothersome I've had no swelling  [] [] [x] [] [] []           9.      Over the  past 2 weeks, on average, how many times have you been found to sleep sitting up in   a chair or with  at least 3 pillows to prop you up because of shortness of breath? Every night  3 or more times a week, but not every night 1-2 times a week  Less than once a week Never over the past 2 weeks   [] [] [] [x] []         10.      Heart failure symptoms can worsen for a number of reasons. How sure are you that you know what to do, or whom to call, if your heart failure gets worse?  Not at all sure Not very sure Somewhat sure Mostly sure Completely sure  [] [] [x] [] []           11.     How well do you understand what things you are able to keep your Heart failure symptoms from getting worse? ( for example weighing yourself,        eating salt diet, ect.) Don't understand at all Do not understand very well Somewhat understand Mostly understand Completely understand  [] [] [] [x] []          12.        Over the past 2 weeks, how much has your heart failure limited enjoyment of life? It has extremely limited my enjoyment of life It has limited my enjoyment of life quite a bit  It has Moderately limited my enjoyment of life It has slight has limited my enjoyment of life It has not limited my enjoyment of life  [] [x] [] [] []          13.        If you had to spend the rest of your life with heart failure the way I is right now, how would you feel about this? Not at all satisfied Mostly dissatisfied Somewhat satisfied Mostly satisfied Completely satisfied  [] [x] [] [] []             14.    Over the past 2 weeks, how often have you felt discouraged or down in the dumps because of your heart failure? I felt that way all of the time I felt that way most of the time  I occasionally felt that way I rarely felt that way I never felt that way   [] [x] [] [] []      15.         How much does your heart failure affect your lifestyle? Please indicate how your heart failure may have limited you participation in the following activates over the past 2 weeks.  Activity Severely Limited Limited quite a bit   Moderately Limited Slightly Limited Did not limit at all Does not apply or did not do for other reasons  Hobbies, recreational activities [x] [] [] [] [] []  Working or doing household chores [] [] [x] [] [] []  Visiting family or friends out of your house  []  [x]  []  []  []  []  Intimate relationships with loved ones   [x]  []  []  []  []  []   6 minute walk test completed Alleviate device implanted Pt given instructions Labs drawn

## 2020-05-26 ENCOUNTER — Other Ambulatory Visit: Payer: Self-pay | Admitting: Internal Medicine

## 2020-05-26 MED ORDER — POTASSIUM CHLORIDE CRYS ER 20 MEQ PO TBCR
EXTENDED_RELEASE_TABLET | ORAL | 3 refills | Status: DC
Start: 2020-05-25 — End: 2020-07-29

## 2020-05-26 MED ORDER — FUROSEMIDE 40 MG PO TABS
ORAL_TABLET | ORAL | 3 refills | Status: DC
Start: 2020-05-25 — End: 2020-07-29

## 2020-05-26 MED FILL — FUROSEMIDE 40 MG TAB: 40 | 30 days supply | Qty: 30 | Fill #0

## 2020-05-26 MED FILL — CARVEDILOL 12.5 MG TABLET: 12.5 | 30 days supply | Qty: 60 | Fill #4

## 2020-05-26 MED FILL — ALBUTEROL 0.083% INHAL SOLN: (2.5 MG/3ML | 4 days supply | Qty: 150 | Fill #7

## 2020-05-26 MED FILL — POTASSIUM CL ER 20 MEQ TAB: 20 | 30 days supply | Qty: 30 | Fill #0

## 2020-05-26 NOTE — Addendum Note (Signed)
Addended by: Dutch Quint B on: 05/26/2020 11:26 AM   Modules accepted: Orders

## 2020-05-28 ENCOUNTER — Other Ambulatory Visit: Payer: Self-pay | Admitting: Cardiology

## 2020-05-28 DIAGNOSIS — E876 Hypokalemia: Secondary | ICD-10-CM

## 2020-05-28 DIAGNOSIS — Z006 Encounter for examination for normal comparison and control in clinical research program: Secondary | ICD-10-CM

## 2020-05-28 DIAGNOSIS — J40 Bronchitis, not specified as acute or chronic: Secondary | ICD-10-CM

## 2020-05-28 MED ORDER — ALBUTEROL SULFATE (2.5 MG/3ML) 0.083% IN NEBU: 2.5000 mg | INHALATION_SOLUTION | RESPIRATORY_TRACT | 0 refills | Status: DC | PRN

## 2020-05-28 MED ORDER — POTASSIUM CHLORIDE CRYS ER 20 MEQ PO TBCR: 20.0000 meq | EXTENDED_RELEASE_TABLET | Freq: Every day | ORAL | 3 refills | Status: DC

## 2020-05-28 NOTE — Research (Signed)
Alleviate Research Study  Baseline labs drawn and resulted:  Patient instructed by DOD to take 60 MEQ Potassium x 2 days and recheck BMET in one week.

## 2020-05-28 NOTE — Research (Unsigned)
Entry error

## 2020-05-28 NOTE — Progress Notes (Signed)
Spoke with Dutch Quint, who was able to reach patient. Will send potassium [facility] al[facility]ol [facility]

## 2020-06-03 MED FILL — CARVEDILOL 12.5 MG TABLET: 12.5 | 30 days supply | Qty: 60 | Fill #4

## 2020-06-03 MED FILL — POTASSIUM CL ER 20 MEQ TAB: 20 | 30 days supply | Qty: 30 | Fill #0

## 2020-06-03 MED FILL — ALBUTEROL 0.083% INHAL SOLN: (2.5 MG/3ML | 2 days supply | Qty: 75 | Fill #0

## 2020-06-03 MED FILL — FUROSEMIDE 40 MG TAB: 40 | 30 days supply | Qty: 30 | Fill #0

## 2020-06-04 ENCOUNTER — Telehealth: Payer: Medicaid Other | Admitting: Internal Medicine

## 2020-06-04 DIAGNOSIS — R55 Syncope and collapse: Secondary | ICD-10-CM

## 2020-06-04 DIAGNOSIS — J4541 Moderate persistent asthma with (acute) exacerbation: Secondary | ICD-10-CM

## 2020-06-09 ENCOUNTER — Telehealth: Payer: Self-pay | Admitting: Internal Medicine

## 2020-06-09 NOTE — Telephone Encounter (Signed)
Pt has appt tomorrow with Dr. [name].

## 2020-06-09 NOTE — Telephone Encounter (Signed)
Spoke with the patient. He feels like Amy called and was trying to reach her. Message has been routed to her in case she had called him.

## 2020-06-09 NOTE — Telephone Encounter (Signed)
Patient states he is returning a call. He was unsure what office called, because he states he has been sick. I did not see any notes.

## 2020-06-10 ENCOUNTER — Encounter: Payer: Self-pay | Admitting: Cardiology

## 2020-06-10 ENCOUNTER — Telehealth (INDEPENDENT_AMBULATORY_CARE_PROVIDER_SITE_OTHER): Payer: Self-pay | Admitting: Cardiology

## 2020-06-10 ENCOUNTER — Other Ambulatory Visit: Payer: Self-pay | Admitting: Cardiology

## 2020-06-10 VITALS — Ht 69.0 in | Wt 250.0 lb

## 2020-06-10 DIAGNOSIS — I10 Essential (primary) hypertension: Secondary | ICD-10-CM

## 2020-06-10 DIAGNOSIS — R600 Localized edema: Secondary | ICD-10-CM

## 2020-06-10 DIAGNOSIS — Z720 Tobacco use: Secondary | ICD-10-CM

## 2020-06-10 DIAGNOSIS — I5031 Acute diastolic (congestive) heart failure: Secondary | ICD-10-CM

## 2020-06-10 DIAGNOSIS — R55 Syncope and collapse: Secondary | ICD-10-CM

## 2020-06-10 DIAGNOSIS — I5042 Chronic combined systolic (congestive) and diastolic (congestive) heart failure: Secondary | ICD-10-CM

## 2020-06-10 MED ORDER — CARVEDILOL 12.5 MG PO TABS: 12.5000 mg | ORAL_TABLET | Freq: Two times a day (BID) | ORAL | 3 refills | Status: DC

## 2020-06-10 MED ORDER — TORSEMIDE 20 MG PO TABS: 20.0000 mg | ORAL_TABLET | Freq: Every day | ORAL | 3 refills | Status: DC

## 2020-06-10 MED FILL — TORSEMIDE 20 MG TABLET: 20 | 30 days supply | Qty: 30 | Fill #0

## 2020-06-10 NOTE — Progress Notes (Signed)
Virtual Visit via Telephone Note   This visit type was conducted due to national recommendations for restrictions regarding the COVID-19 Pandemic (e.g. social distancing) in an effort to limit this patient's exposure and mitigate transmission in our community.  Due to his co-morbid illnesses, this patient is at least at moderate risk for complications without adequate follow up.  This format is felt to be most appropriate for this patient at this time.  The patient did not have access to video technology/had technical difficulties with video requiring transitioning to audio format only (telephone).  All issues noted in this document were discussed and addressed.  No physical exam could be performed with this format.  Please refer to the patient's chart for his  consent to telehealth for [facility].   The patient was identified using 2 identifiers.  Patient Location: Home Provider Location: Home Office  Date:  06/10/2020   ID:  GREGORIO WORLEY, DOB August 23, 1975, MRN [id number]  PCP:  Arvilla Market, DO  Cardiologist:  Jodelle Red, MD  Referring MD: Leary Roca*   CC: follow up  History of Present Illness:    CRISTO AUSBURN is a 44 y.o. male with a hx of chronic systolic and diastolic heart failure, nonischemic cardiomyopathy, hypertension (previously uncontrolled), alcohol/tobacco use, LLE DVT previously on apixaban who is seen for follow up today. I initially saw him 12/05/19 as a new consult at the request of Leary Roca* for the evaluation and management of heart failure with recent hospitalization.  Today: Slowly doing better. Weight has come down, now largely between 245 lbs and 250 lbs. Was running 260 lbs at the peak. Still coughing, albuterol helping. Does continue to have cough syncope about twice/week, but feels it coming and lies down so he doesn't fall. Leg swelling still present but much improved, calves are almost at his normal  size. He tries to elevated them, plans to get compression stockings.   Still smoking, about 1 ppd, working on quitting.   Has a pending PCP appt. Is hopeful that maybe if he gets a controller inhaler his coughing will get better.  Breathing overall is slowly improving. Blood pressure has been well controlled.  He had implant for Alleviate study. Has the PRN lasix and potassium ordered for this, understands that once his device is active they will call him through the program if he needs to take this.  Has been out of torsemide for about a week. Needs a refill on carvedilol as well. We will change from twice daily to daily torsemide given the improvement in his weight and swelling. He will call me if his weight goes up or swelling worsens.  Past Medical History:  Diagnosis Date  . Alcoholism /alcohol abuse   . Aortic atherosclerosis (HCC)   . Asthma   . Chest pain with normal coronary angiography 10/2018  . Chronic combined systolic and diastolic CHF (congestive heart failure) (HCC)   . Fatty liver   . Hypertension    Takes no medicine  . Left leg DVT (HCC) 09/2019  . NICM (nonischemic cardiomyopathy) (HCC)   . Tobacco abuse     Past Surgical History:  Procedure Laterality Date  . implantable loop recorder placement  05/25/2020   Medtronic Reveal Preston model [id number] (SN [id number]) implanted by Dr Johney Frame for Alleviate HF study  . RIGHT/LEFT HEART CATH AND CORONARY ANGIOGRAPHY N/A 10/24/2018   Procedure: RIGHT/LEFT HEART CATH AND CORONARY ANGIOGRAPHY;  Surgeon: Orpah Cobb, MD;  [facility] INVASIVE  CV LAB;  Service: Cardiovascular;  Laterality: N/A;    Current Medications: Current Outpatient Medications on File Prior to Visit  Medication Sig  . albuterol (PROVENTIL) (2.5 MG/3ML) 0.083% nebulizer solution Take 3 mLs (2.5 mg total) by nebulization every 2 (two) hours as needed for wheezing.  Marland Kitchen aspirin EC 325 MG tablet Take 325 mg by mouth daily as needed for moderate pain.  .  furosemide (LASIX) 40 MG tablet As needed patient may take 40 MG Lasix PRN by mouth daily as directed  per Alleviate Research HF Study PRN plan.  . potassium chloride SA (KLOR-CON M20) 20 MEQ tablet As needed patient may take 20 MEQ Potassium PRN by mouth daily as directed  per Alleviate Research HF Study PRN plan.  . [DISCONTINUED] atorvastatin (LIPITOR) 40 MG tablet Take 1 tablet (40 mg total) by mouth daily at 6 PM. (Patient not taking: Reported on 03/27/2019)   No current facility-administered medications on file prior to visit.     Allergies:   Patient has no known allergies.   Social History   Tobacco Use  . Smoking status: Current Every Day Smoker    Packs/day: 1.50    Types: Cigarettes  . Smokeless tobacco: Never Used  Vaping Use  . Vaping Use: Never used  Substance Use Topics  . Alcohol use: Yes    Comment: occasional  . Drug use: No    Family History: family history includes COPD in his mother; Cancer in his mother; Diabetes in his father.  ROS:   Please see the history of present illness.  Additional pertinent ROS otherwise unremarkable   EKGs/Labs/Other Studies Reviewed:    The following studies were reviewed today: Echo 11/11/19  1. Left ventricular ejection fraction, by estimation, is 45 to 50%. The  left ventricle has mildly decreased function. The left ventricle  demonstrates regional wall motion abnormalities (see scoring  diagram/findings for description). Left ventricular  diastolic parameters are consistent with Grade II diastolic dysfunction  (pseudonormalization). There is mild hypokinesis of the left ventricular,  basal anterior wall, septal wall and inferior wall.  2. Right ventricular systolic function is mildly reduced. The right  ventricular size is mildly enlarged.  3. Left atrial size was moderately dilated.  4. Right atrial size was moderately dilated.  5. The mitral valve is normal in structure. Trivial mitral valve  regurgitation.   6. The aortic valve is normal in structure. Aortic valve regurgitation is  not visualized.  7. The inferior vena cava is normal in size with greater than 50%  respiratory variability, suggesting right atrial pressure of 3 mmHg.   Echo 10/25/18 1. Mild hypokinesis of the left ventricular, entire anteroseptal wall and  inferolateral wall.  2. The left ventricle has moderately reduced systolic function, with an  ejection fraction of 35-40%. The cavity size was normal. Left ventricular  diastolic Doppler parameters are consistent with impaired relaxation.  3. The right ventricle has normal systolic function. The cavity was  normal. There is no increase in right ventricular wall thickness.   R/LHC 10/24/18 Normal cors, normal LVEDP. CO 5.3, CI 2.61, RA mean 0, PA 21/7 (13). Wedge mean 1. LVEDP 4. Ao 122/81.  EKG:  EKG is personally reviewed.  The ekg ordered 05/25/20 demonstrates NSR  Recent Labs: 11/11/2019: Magnesium 1.7; TSH 2.775 02/02/2020: ALT 16; B Natriuretic Peptide 30.5 05/25/2020: BUN 12; Creatinine, Ser 1.05; Hemoglobin 16.9; NT-Pro BNP 409; Platelets 185; Potassium 3.3; Sodium 141  Recent Lipid Panel    Component Value Date/Time  CHOL 177 10/24/2018 0832   TRIG 122 10/24/2018 0832   HDL 63 10/24/2018 0832   CHOLHDL 2.8 10/24/2018 0832   VLDL 24 10/24/2018 0832   LDLCALC 90 10/24/2018 0832    Physical Exam:    VS:  Ht 5\' 9"  (1.753 m)   Wt 250 lb (113.4 kg)   BMI 36.92 kg/m     Wt Readings from Last 3 Encounters:  06/10/20 250 lb (113.4 kg)  05/25/20 254 lb (115.2 kg)  05/25/20 254 lb (115.2 kg)    Speaking comfortably on the phone, no audible wheezing In no acute distress Alert and oriented Normal affect Normal speech  ASSESSMENT:    1. Syncope, unspecified syncope type   2. Bilateral leg edema   3. Acute diastolic heart failure (HCC)   4. Chronic combined systolic and diastolic heart failure (HCC)   5. Essential hypertension   6. Tobacco abuse     PLAN:    Syncope: -highly related to cough. He knows when to expect it and now lays down when he feels it coming.  Chronic combined systolic and diastolic heart failure, nonischemic cardiomyopathy: recent EF normalized. -diagnosed 10/2018, recent admission 10/2019 -previously followed by Dr. 11/2019 -echo, cath reviewed as above -has been enrolled in the Alleviate-HF trial. Has PRN lasix and potassium as part of the trial -continue management of risk factors, including hypertension, tobacco, and alcohol use -ordered BNP, BMET previously, not yet completed -continue carvedilol 12.5 mg BID, refilled today. Losartan on hold given syncope -doing well on torsemide. Will refill and change to once daily dosing, reports that leg swelling is improving  Hypertension: -goal <130/80 -off of amlodipine, losartan given recent syncope -continue carvedilol 12.5 mg BID -continue torsemide  History of DVT of lower extremity: diagnosed during hospitalization 10/2019 -was given 3 mos of apixaban, now discontinued  Tobacco abuse counseling: continues to work on quitting  Cardiac risk counseling and prevention recommendations: -recommend heart healthy/Mediterranean diet, with whole grains, fruits, vegetable, fish, lean meats, nuts, and olive oil. Limit salt. -recommend moderate walking, 3-5 times/week for 30-50 minutes each session. Aim for at least 150 minutes.week. Goal should be pace of 3 miles/hours, or walking 1.5 miles in 30 minutes -recommend avoidance of tobacco products. Avoid excess alcohol. -Additional risk factor control:  -Diabetes risk: A1c is not available, denies history  -Lipids: per [facility], Tchol 177, HFL 63, LDL 90, TG 122. He was on aspirin and atorvastatin previously. He stopped these recently. With no evidence of CAD on cath, ok to hold on these for now, reassess risk in the future  -Blood pressure control: as above  -Weight: BMI ~37, discussed lifestyle today  Plan for follow up: 2  mos  Today, I have spent 16 minutes with the patient with telehealth technology discussing the above problems.  Additional time spent in chart review, documentation, and communication.  11/2019, MD, PhD [place]  [facility]    Medication Adjustments/Labs and Tests Ordered: Current medicines are reviewed at length with the patient today.  Concerns regarding medicines are outlined above.  No orders of the defined types were placed in this encounter.  Meds ordered this encounter  Medications  . carvedilol (COREG) 12.5 MG tablet    Sig: Take 1 tablet (12.5 mg total) by mouth 2 (two) times daily.    Dispense:  180 tablet    Refill:  3  . torsemide (DEMADEX) 20 MG tablet    Sig: Take 1 tablet (20 mg total) by mouth daily.  Dispense:  90 tablet    Refill:  3    Patient Instructions  Medication Instructions:  Decreased Torsemide to 20mg  (1) tablet ONCE daily  *If you need a refill on your cardiac medications before your next appointment, please call your pharmacy*  Follow-Up: At [facility], you and your health needs are our priority.  As part of our continuing mission to provide you with exceptional heart care, we have created designated Provider Care Teams.  These Care Teams include your primary Cardiologist (physician) and Advanced Practice Providers (APPs -  Physician Assistants and Nurse Practitioners) who all work together to provide you with the care you need, when you need it.  We recommend signing up for the patient portal called "MyChart".  Sign up information is provided on this After Visit Summary.  MyChart is used to connect with patients for Virtual Visits (Telemedicine).  Patients are able to view lab/test results, encounter notes, upcoming appointments, etc.  Non-urgent messages can be sent to your provider as well.   To learn more about what you can do with MyChart, go to [facility].    Your next appointment:   2 month(s)  The  format for your next appointment:   In Person or Virtual   Provider:   [email or web address], MD    Signed, Jodelle Red, MD PhD 06/10/2020  [facility]

## 2020-06-10 NOTE — Patient Instructions (Addendum)
Medication Instructions:  Decreased Torsemide to 20mg  (1) tablet ONCE daily  *If you need a refill on your cardiac medications before your next appointment, please call your pharmacy*  Follow-Up: At [facility], you and your health needs are our priority.  As part of our continuing mission to provide you with exceptional heart care, we have created designated Provider Care Teams.  These Care Teams include your primary Cardiologist (physician) and Advanced Practice Providers (APPs -  Physician Assistants and Nurse Practitioners) who all work together to provide you with the care you need, when you need it.  We recommend signing up for the patient portal called "MyChart".  Sign up information is provided on this After Visit Summary.  MyChart is used to connect with patients for Virtual Visits (Telemedicine).  Patients are able to view lab/test results, encounter notes, upcoming appointments, etc.  Non-urgent messages can be sent to your provider as well.   To learn more about what you can do with MyChart, go to [facility].    Your next appointment:   2 month(s)  The format for your next appointment:   In Person or Virtual   Provider:   [email or web address], MD

## 2020-06-15 ENCOUNTER — Telehealth: Payer: Self-pay

## 2020-06-15 ENCOUNTER — Encounter: Payer: Self-pay | Admitting: Internal Medicine

## 2020-06-15 ENCOUNTER — Telehealth (INDEPENDENT_AMBULATORY_CARE_PROVIDER_SITE_OTHER): Payer: Self-pay | Admitting: Internal Medicine

## 2020-06-15 ENCOUNTER — Other Ambulatory Visit: Payer: Self-pay | Admitting: Internal Medicine

## 2020-06-15 DIAGNOSIS — R55 Syncope and collapse: Secondary | ICD-10-CM

## 2020-06-15 DIAGNOSIS — Z8709 Personal history of other diseases of the respiratory system: Secondary | ICD-10-CM

## 2020-06-15 DIAGNOSIS — R053 Chronic cough: Secondary | ICD-10-CM

## 2020-06-15 MED ORDER — ALBUTEROL SULFATE HFA 108 (90 BASE) MCG/ACT IN AERS: 2.0000 | INHALATION_SPRAY | Freq: Four times a day (QID) | RESPIRATORY_TRACT | 2 refills | Status: DC | PRN

## 2020-06-15 MED FILL — ALBUTEROL SULFATE HFA 108 (: 108 (90 BAS | 25 days supply | Qty: 18 | Fill #0

## 2020-06-15 NOTE — Telephone Encounter (Signed)
Called pt and gave all infromation

## 2020-06-15 NOTE — Telephone Encounter (Signed)
Patient has been scheduled for his PFT & screening Covid test.  Covid test is scheduled for 06/20/2020 @ 10:10. It is a drive up test at the address below.  This is a Drive Up Visit at [address] [place], [place] [address] Someone will direct you to the appropriate testing line. Stay in your car and someone will be with you shortly.      PFT is scheduled at [facility] on 06/22/2020 @ 2:45 pm. He will enter through Entrance A.   If he needs to reschedule, the number is [phone].

## 2020-06-15 NOTE — Progress Notes (Signed)
Virtual Visit via Telephone Note  I connected with Jesus Fuller, on 06/15/2020 at 2:53 PM by telephone due to the COVID-19 pandemic and verified that I am speaking with the correct person using two identifiers.   Consent: I discussed the limitations, risks, security and privacy concerns of performing an evaluation and management service by telephone and the availability of in person appointments. I also discussed with the patient that there may be a patient responsible charge related to this service. The patient expressed understanding and agreed to proceed.   Location of Patient: Home   Location of Provider: Clinic    Persons participating in Telemedicine visit: Daryl M Markeese [facility] Dr. Earlene Plater      History of Present Illness: Patient has a visit to f/u per cardiology request. Reports that he has had device implanted to monitor his volume status, etc.   Was diagnosed with acute bronchitis 2 months ago. Reports cough has persisted. He has had syncopal episodes related to his cough. Using albuterol nebulizer a couple times per day. Has cough in the AM and at nighttime. Current smoker. Smoking about 1 pack per day. Has cut back from 2 ppd. Works in [employer] job and reports environment has a lot of smoke in air. Reports history of childhood asthma and has had episodes of coughing with syncope in the past related to coughing. Has had negative COVID test.    Past Medical History:  Diagnosis Date  . Alcoholism /alcohol abuse   . Aortic atherosclerosis (HCC)   . Asthma   . Chest pain with normal coronary angiography 10/2018  . Chronic combined systolic and diastolic CHF (congestive heart failure) (HCC)   . Fatty liver   . Hypertension    Takes no medicine  . Left leg DVT (HCC) 09/2019  . NICM (nonischemic cardiomyopathy) (HCC)   . Tobacco abuse    No Known Allergies  Current Outpatient Medications on File Prior to Visit  Medication Sig Dispense Refill  . albuterol  (PROVENTIL) (2.5 MG/3ML) 0.083% nebulizer solution Take 3 mLs (2.5 mg total) by nebulization every 2 (two) hours as needed for wheezing. 75 mL 0  . aspirin EC 325 MG tablet Take 325 mg by mouth daily as needed for moderate pain.    . carvedilol (COREG) 12.5 MG tablet Take 1 tablet (12.5 mg total) by mouth 2 (two) times daily. 180 tablet 3  . furosemide (LASIX) 40 MG tablet As needed patient may take 40 MG Lasix PRN by mouth daily as directed  per Alleviate Research HF Study PRN plan. 30 tablet 3  . potassium chloride SA (KLOR-CON M20) 20 MEQ tablet As needed patient may take 20 MEQ Potassium PRN by mouth daily as directed  per Alleviate Research HF Study PRN plan. 30 tablet 3  . torsemide (DEMADEX) 20 MG tablet Take 1 tablet (20 mg total) by mouth daily. 90 tablet 3  . [DISCONTINUED] atorvastatin (LIPITOR) 40 MG tablet Take 1 tablet (40 mg total) by mouth daily at 6 PM. (Patient not taking: Reported on 03/27/2019) 30 tablet 3   No current facility-administered medications on file prior to visit.    Observations/Objective: NAD. Speaking clearly.  Work of breathing normal.  Alert and oriented. Mood appropriate.   Assessment and Plan: 1. Syncope, unspecified syncope type Seems related to episodes of coughing. Will need to work on management of chronic cough.   2. Chronic cough Patient with chronic cough. No offending medications. Volume status with CHF currently at baseline. Given history  of asthma and current smoker status, warrants PFTs. Reviewed that CXRs obtained over the past year have been negative for pulmonary findings.  - Pulmonary function test; Future - albuterol (VENTOLIN HFA) 108 (90 Base) MCG/ACT inhaler; Inhale 2 puffs into the lungs every 6 (six) hours as needed for wheezing or shortness of breath.  Dispense: 18 g; Refill: 2  3. History of asthma - albuterol (VENTOLIN HFA) 108 (90 Base) MCG/ACT inhaler; Inhale 2 puffs into the lungs every 6 (six) hours as needed for wheezing or  shortness of breath.  Dispense: 18 g; Refill: 2   Follow Up Instructions: PFTs   I discussed the assessment and treatment plan with the patient. The patient was provided an opportunity to ask questions and all were answered. The patient agreed with the plan and demonstrated an understanding of the instructions.   The patient was advised to call back or seek an in-person evaluation if the symptoms worsen or if the condition fails to improve as anticipated.     I provided 16 minutes total of non-face-to-face time during this encounter including median intraservice time, reviewing previous notes, investigations, ordering medications, medical decision making, coordinating care and patient verbalized understanding at the end of the visit.    Marcy Siren, D.O. Primary Care at [facility]  06/15/2020, 2:53 PM

## 2020-06-17 ENCOUNTER — Other Ambulatory Visit: Payer: Self-pay | Admitting: Cardiology

## 2020-06-17 DIAGNOSIS — J40 Bronchitis, not specified as acute or chronic: Secondary | ICD-10-CM

## 2020-06-17 MED FILL — TORSEMIDE 20 MG TABLET: 20 | 30 days supply | Qty: 30 | Fill #0

## 2020-06-18 ENCOUNTER — Other Ambulatory Visit: Payer: Self-pay | Admitting: Cardiology

## 2020-06-18 MED FILL — ALBUTEROL 0.083% INHAL SOLN: (2.5 MG/3ML | 2 days supply | Qty: 75 | Fill #0

## 2020-06-20 ENCOUNTER — Inpatient Hospital Stay (HOSPITAL_COMMUNITY): Admission: RE | Admit: 2020-06-20 | Payer: Medicaid Other | Source: Ambulatory Visit

## 2020-06-22 ENCOUNTER — Inpatient Hospital Stay (HOSPITAL_COMMUNITY): Admission: RE | Admit: 2020-06-22 | Payer: Medicaid Other | Source: Ambulatory Visit

## 2020-07-22 ENCOUNTER — Encounter (HOSPITAL_COMMUNITY): Payer: Self-pay | Admitting: Emergency Medicine

## 2020-07-22 ENCOUNTER — Telehealth: Payer: Self-pay

## 2020-07-22 ENCOUNTER — Emergency Department (HOSPITAL_COMMUNITY): Payer: Medicaid Other

## 2020-07-22 ENCOUNTER — Observation Stay (HOSPITAL_COMMUNITY): Payer: Medicaid Other

## 2020-07-22 ENCOUNTER — Inpatient Hospital Stay (HOSPITAL_COMMUNITY)
Admission: EM | Admit: 2020-07-22 | Discharge: 2020-07-29 | DRG: 191 | Disposition: A | Discharge status: Home / Self Care | Payer: Self-pay | Attending: Internal Medicine | Admitting: Internal Medicine

## 2020-07-22 DIAGNOSIS — Z006 Encounter for examination for normal comparison and control in clinical research program: Secondary | ICD-10-CM

## 2020-07-22 DIAGNOSIS — Z9119 Patient's noncompliance with other medical treatment and regimen: Secondary | ICD-10-CM

## 2020-07-22 DIAGNOSIS — I959 Hypotension, unspecified: Secondary | ICD-10-CM | POA: Diagnosis present

## 2020-07-22 DIAGNOSIS — F17210 Nicotine dependence, cigarettes, uncomplicated: Secondary | ICD-10-CM | POA: Diagnosis present

## 2020-07-22 DIAGNOSIS — J40 Bronchitis, not specified as acute or chronic: Secondary | ICD-10-CM

## 2020-07-22 DIAGNOSIS — E861 Hypovolemia: Secondary | ICD-10-CM | POA: Diagnosis present

## 2020-07-22 DIAGNOSIS — R0902 Hypoxemia: Secondary | ICD-10-CM | POA: Diagnosis present

## 2020-07-22 DIAGNOSIS — R054 Cough syncope: Secondary | ICD-10-CM | POA: Diagnosis present

## 2020-07-22 DIAGNOSIS — J9691 Respiratory failure, unspecified with hypoxia: Secondary | ICD-10-CM

## 2020-07-22 DIAGNOSIS — J441 Chronic obstructive pulmonary disease with (acute) exacerbation: Principal | ICD-10-CM | POA: Diagnosis present

## 2020-07-22 DIAGNOSIS — Z20822 Contact with and (suspected) exposure to covid-19: Secondary | ICD-10-CM | POA: Diagnosis present

## 2020-07-22 DIAGNOSIS — I248 Other forms of acute ischemic heart disease: Secondary | ICD-10-CM | POA: Diagnosis present

## 2020-07-22 DIAGNOSIS — K219 Gastro-esophageal reflux disease without esophagitis: Secondary | ICD-10-CM | POA: Diagnosis present

## 2020-07-22 DIAGNOSIS — R778 Other specified abnormalities of plasma proteins: Secondary | ICD-10-CM

## 2020-07-22 DIAGNOSIS — R0982 Postnasal drip: Secondary | ICD-10-CM | POA: Diagnosis present

## 2020-07-22 DIAGNOSIS — R053 Chronic cough: Secondary | ICD-10-CM

## 2020-07-22 DIAGNOSIS — E876 Hypokalemia: Principal | ICD-10-CM | POA: Diagnosis present

## 2020-07-22 DIAGNOSIS — I428 Other cardiomyopathies: Secondary | ICD-10-CM | POA: Diagnosis present

## 2020-07-22 DIAGNOSIS — Z6839 Body mass index (BMI) 39.0-39.9, adult: Secondary | ICD-10-CM

## 2020-07-22 DIAGNOSIS — I2510 Atherosclerotic heart disease of native coronary artery without angina pectoris: Secondary | ICD-10-CM | POA: Diagnosis present

## 2020-07-22 DIAGNOSIS — Z86718 Personal history of other venous thrombosis and embolism: Secondary | ICD-10-CM

## 2020-07-22 DIAGNOSIS — Z79899 Other long term (current) drug therapy: Secondary | ICD-10-CM

## 2020-07-22 DIAGNOSIS — F1020 Alcohol dependence, uncomplicated: Secondary | ICD-10-CM | POA: Diagnosis present

## 2020-07-22 DIAGNOSIS — E860 Dehydration: Secondary | ICD-10-CM | POA: Diagnosis present

## 2020-07-22 DIAGNOSIS — G4733 Obstructive sleep apnea (adult) (pediatric): Secondary | ICD-10-CM | POA: Diagnosis present

## 2020-07-22 DIAGNOSIS — J45901 Unspecified asthma with (acute) exacerbation: Secondary | ICD-10-CM | POA: Diagnosis present

## 2020-07-22 DIAGNOSIS — Z8709 Personal history of other diseases of the respiratory system: Secondary | ICD-10-CM

## 2020-07-22 DIAGNOSIS — K760 Fatty (change of) liver, not elsewhere classified: Secondary | ICD-10-CM | POA: Diagnosis present

## 2020-07-22 DIAGNOSIS — W19XXXA Unspecified fall, initial encounter: Secondary | ICD-10-CM | POA: Diagnosis present

## 2020-07-22 DIAGNOSIS — E871 Hypo-osmolality and hyponatremia: Secondary | ICD-10-CM | POA: Diagnosis present

## 2020-07-22 DIAGNOSIS — R55 Syncope and collapse: Principal | ICD-10-CM

## 2020-07-22 DIAGNOSIS — I5042 Chronic combined systolic (congestive) and diastolic (congestive) heart failure: Secondary | ICD-10-CM | POA: Diagnosis present

## 2020-07-22 DIAGNOSIS — E872 Acidosis: Secondary | ICD-10-CM | POA: Diagnosis present

## 2020-07-22 DIAGNOSIS — I10 Essential (primary) hypertension: Secondary | ICD-10-CM

## 2020-07-22 DIAGNOSIS — Z825 Family history of asthma and other chronic lower respiratory diseases: Secondary | ICD-10-CM

## 2020-07-22 DIAGNOSIS — I110 Hypertensive heart disease with heart failure: Secondary | ICD-10-CM | POA: Diagnosis present

## 2020-07-22 DIAGNOSIS — I5032 Chronic diastolic (congestive) heart failure: Secondary | ICD-10-CM

## 2020-07-22 LAB — URINALYSIS, ROUTINE W REFLEX MICROSCOPIC
Bilirubin Urine: NEGATIVE
Glucose, UA: NEGATIVE mg/dL
Hgb urine dipstick: NEGATIVE
Ketones, ur: NEGATIVE mg/dL
Leukocytes,Ua: NEGATIVE
Nitrite: NEGATIVE
Protein, ur: NEGATIVE mg/dL
Specific Gravity, Urine: 1.031 — ABNORMAL HIGH (ref 1.005–1.030)
pH: 5 (ref 5.0–8.0)

## 2020-07-22 LAB — BASIC METABOLIC PANEL
Anion gap: 18 — ABNORMAL HIGH (ref 5–15)
BUN: 25 mg/dL — ABNORMAL HIGH (ref 6–20)
CO2: 25 mmol/L (ref 22–32)
Calcium: 8.3 mg/dL — ABNORMAL LOW (ref 8.9–10.3)
Chloride: 85 mmol/L — ABNORMAL LOW (ref 98–111)
Creatinine, Ser: 1.65 mg/dL — ABNORMAL HIGH (ref 0.61–1.24)
GFR, Estimated: 53 mL/min — ABNORMAL LOW (ref >60)
Glucose, Bld: 124 mg/dL — ABNORMAL HIGH (ref 70–99)
Potassium: 3.9 mmol/L (ref 3.5–5.1)
Sodium: 128 mmol/L — ABNORMAL LOW (ref 135–145)

## 2020-07-22 LAB — CBC
HCT: 42.4 % (ref 39.0–52.0)
HCT: 43.4 % (ref 39.0–52.0)
Hemoglobin: 15.8 g/dL (ref 13.0–17.0)
Hemoglobin: 15.1 g/dL (ref 13.0–17.0)
MCH: 39.4 pg — ABNORMAL HIGH (ref 26.0–34.0)
MCH: 37.7 pg — ABNORMAL HIGH (ref 26.0–34.0)
MCHC: 37.3 g/dL — ABNORMAL HIGH (ref 30.0–36.0)
MCHC: 34.8 g/dL (ref 30.0–36.0)
MCV: 105.7 fL — ABNORMAL HIGH (ref 80.0–100.0)
MCV: 108.2 fL — ABNORMAL HIGH (ref 80.0–100.0)
Platelets: 169 10*3/uL (ref 150–400)
Platelets: 147 10*3/uL — ABNORMAL LOW (ref 150–400)
RBC: 4.01 MIL/uL — ABNORMAL LOW (ref 4.22–5.81)
RBC: 4.01 MIL/uL — ABNORMAL LOW (ref 4.22–5.81)
RDW: 15.9 % — ABNORMAL HIGH (ref 11.5–15.5)
RDW: 15.9 % — ABNORMAL HIGH (ref 11.5–15.5)
WBC: 6.2 10*3/uL (ref 4.0–10.5)
WBC: 5.9 10*3/uL (ref 4.0–10.5)
nRBC: 0 % (ref 0.0–0.2)
nRBC: 0 % (ref 0.0–0.2)

## 2020-07-22 LAB — I-STAT VENOUS BLOOD GAS, ED
Acid-Base Excess: 15 mmol/L — ABNORMAL HIGH (ref 0.0–2.0)
Bicarbonate: 40.3 mmol/L — ABNORMAL HIGH (ref 20.0–28.0)
Calcium, Ion: 0.97 mmol/L — ABNORMAL LOW (ref 1.15–1.40)
HCT: 48 % (ref 39.0–52.0)
Hemoglobin: 16.3 g/dL (ref 13.0–17.0)
O2 Saturation: 100 %
Potassium: 2.8 mmol/L — ABNORMAL LOW (ref 3.5–5.1)
Sodium: 126 mmol/L — ABNORMAL LOW (ref 135–145)
TCO2: 42 mmol/L — ABNORMAL HIGH (ref 22–32)
pCO2, Ven: 50.5 mmHg (ref 44.0–60.0)
pH, Ven: 7.51 — ABNORMAL HIGH (ref 7.250–7.430)
pO2, Ven: 180 mmHg — ABNORMAL HIGH (ref 32.0–45.0)

## 2020-07-22 LAB — RAPID URINE DRUG SCREEN, HOSP PERFORMED
Amphetamines: NOT DETECTED
Barbiturates: NOT DETECTED
Benzodiazepines: NOT DETECTED
Cocaine: NOT DETECTED
Opiates: NOT DETECTED
Tetrahydrocannabinol: NOT DETECTED

## 2020-07-22 LAB — CREATININE, SERUM
Creatinine, Ser: 1.44 mg/dL — ABNORMAL HIGH (ref 0.61–1.24)
GFR, Estimated: >60 mL/min (ref >60)

## 2020-07-22 LAB — TROPONIN I (HIGH SENSITIVITY)
Troponin I (High Sensitivity): 18 ng/L — ABNORMAL HIGH (ref <18)
Troponin I (High Sensitivity): 27 ng/L — ABNORMAL HIGH (ref <18)

## 2020-07-22 LAB — BRAIN NATRIURETIC PEPTIDE: B Natriuretic Peptide: 29.4 pg/mL (ref 0.0–100.0)

## 2020-07-22 LAB — RESP PANEL BY RT-PCR (FLU A&B, COVID) ARPGX2
Influenza A by PCR: NEGATIVE
Influenza B by PCR: NEGATIVE
SARS Coronavirus 2 by RT PCR: NEGATIVE

## 2020-07-22 LAB — PROCALCITONIN: Procalcitonin: 0.57 ng/mL

## 2020-07-22 LAB — LACTIC ACID, PLASMA
Lactic Acid, Venous: 4 mmol/L (ref 0.5–1.9)
Lactic Acid, Venous: 2.8 mmol/L (ref 0.5–1.9)

## 2020-07-22 MED ORDER — CARVEDILOL 12.5 MG PO TABS
12.5000 mg | ORAL_TABLET | Freq: Two times a day (BID) | ORAL | Status: DC
  Administered 2020-07-23 – 2020-07-29 (×13): 12.5 mg via ORAL
  Filled 2020-07-22 (×14): qty 1
  Filled 2020-07-22: qty 4
  Filled 2020-07-22: qty 1

## 2020-07-22 MED ORDER — NICOTINE 14 MG/24HR TD PT24
14.0000 mg | MEDICATED_PATCH | Freq: Every day | TRANSDERMAL | Status: DC
  Administered 2020-07-22 – 2020-07-27 (×6): 14 mg via TRANSDERMAL
  Filled 2020-07-22 (×6): qty 1

## 2020-07-22 MED ORDER — POLYETHYLENE GLYCOL 3350 17 G PO PACK: 17.0000 g | PACK | Freq: Every day | ORAL | Status: DC | PRN

## 2020-07-22 MED ORDER — SODIUM CHLORIDE 0.9 % IV BOLUS
1000.0000 mL | Freq: Once | INTRAVENOUS | Status: AC
  Administered 2020-07-22: 1000 mL via INTRAVENOUS

## 2020-07-22 MED ORDER — IOHEXOL 350 MG/ML SOLN: 75.0000 mL | Freq: Once | INTRAVENOUS | Status: DC | PRN

## 2020-07-22 MED ORDER — ONDANSETRON HCL 4 MG/2ML IJ SOLN
4.0000 mg | Freq: Four times a day (QID) | INTRAMUSCULAR | Status: DC | PRN
  Administered 2020-07-22 – 2020-07-23 (×2): 4 mg via INTRAVENOUS
  Filled 2020-07-22 (×2): qty 2

## 2020-07-22 MED ORDER — ONDANSETRON HCL 4 MG PO TABS: 4.0000 mg | ORAL_TABLET | Freq: Four times a day (QID) | ORAL | Status: DC | PRN

## 2020-07-22 MED ORDER — SODIUM CHLORIDE 0.9 % IV SOLN
1.0000 g | Freq: Once | INTRAVENOUS | Status: AC
  Administered 2020-07-22: 1 g via INTRAVENOUS
  Filled 2020-07-22: qty 10

## 2020-07-22 MED ORDER — ENOXAPARIN SODIUM 40 MG/0.4ML ~~LOC~~ SOLN
40.0000 mg | SUBCUTANEOUS | Status: DC
  Administered 2020-07-22 – 2020-07-28 (×7): 40 mg via SUBCUTANEOUS
  Filled 2020-07-22 (×8): qty 0.4

## 2020-07-22 MED ORDER — ACETAMINOPHEN 650 MG RE SUPP: 650.0000 mg | Freq: Four times a day (QID) | RECTAL | Status: DC | PRN

## 2020-07-22 MED ORDER — IOHEXOL 350 MG/ML SOLN
80.0000 mL | Freq: Once | INTRAVENOUS | Status: AC | PRN
  Administered 2020-07-22: 80 mL via INTRAVENOUS

## 2020-07-22 MED ORDER — THIAMINE HCL 100 MG PO TABS
100.0000 mg | ORAL_TABLET | Freq: Every day | ORAL | Status: DC
  Administered 2020-07-23 – 2020-07-24 (×2): 100 mg via ORAL
  Filled 2020-07-22 (×6): qty 1

## 2020-07-22 MED ORDER — MORPHINE SULFATE (PF) 2 MG/ML IV SOLN
2.0000 mg | Freq: Once | INTRAVENOUS | Status: AC
  Administered 2020-07-22: 2 mg via INTRAVENOUS
  Filled 2020-07-22: qty 1

## 2020-07-22 MED ORDER — METOCLOPRAMIDE HCL 5 MG/ML IJ SOLN
10.0000 mg | Freq: Four times a day (QID) | INTRAMUSCULAR | Status: DC | PRN
  Administered 2020-07-22 – 2020-07-24 (×2): 10 mg via INTRAVENOUS
  Filled 2020-07-22 (×2): qty 2

## 2020-07-22 MED ORDER — SODIUM CHLORIDE 0.9 % IV SOLN
500.0000 mg | Freq: Once | INTRAVENOUS | Status: AC
  Administered 2020-07-22: 500 mg via INTRAVENOUS
  Filled 2020-07-22: qty 500

## 2020-07-22 MED ORDER — ACETAMINOPHEN 325 MG PO TABS
650.0000 mg | ORAL_TABLET | Freq: Four times a day (QID) | ORAL | Status: DC | PRN
  Administered 2020-07-26: 650 mg via ORAL
  Filled 2020-07-22: qty 2

## 2020-07-22 NOTE — ED Notes (Signed)
Sudden onset of N/V.

## 2020-07-22 NOTE — ED Notes (Signed)
Attempted to wean pt off of 15L NRB. Pt O2 sats dropped quickly. Placed pt on 6L [place]. Pt now @ 90% 6LNC. Will reevaluate placing pt back on NRB if needed.

## 2020-07-22 NOTE — ED Provider Notes (Signed)
[facility] EMERGENCY DEPARTMENT Provider Note   CSN: [id number] Arrival date & time: 07/22/20  1322     History Chief Complaint  Patient presents with  . Loss of Consciousness    Jesus Fuller is a 45 y.o. male.  45 yo M with a chief complaint of a syncopal event.  Reportedly has had multiple syncopal events in the past.  Usually after coughing.  Thought to be cough related syncope.  Currently with a implanted cardiac monitor.  Patient denies any chest pain or shortness of breath.  Has been significantly swollen which she thinks is unchanged.  Feels somewhat crummy today.  Unsure how to quantify it.  Per EMS had a syncopal event that occurred with exertion and lasted for about 8 minutes.  The history is provided by the patient.       Past Medical History:  Diagnosis Date  . Alcoholism /alcohol abuse   . Aortic atherosclerosis (HCC)   . Asthma   . Chest pain with normal coronary angiography 10/2018  . Chronic combined systolic and diastolic CHF (congestive heart failure) (HCC)   . Fatty liver   . Hypertension    Takes no medicine  . Left leg DVT (HCC) 09/2019  . NICM (nonischemic cardiomyopathy) (HCC)   . Tobacco abuse     Patient Active Problem List   Diagnosis Date Noted  . Acute bronchitis 02/02/2020  . Syncope 02/02/2020  . 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 Chronic combined systolic and diastolic heart failure (HCC) 12/05/2019  . Essential hypertension 12/02/2019  . Tobacco abuse 11/10/2019    Past Surgical History:  Procedure Laterality Date  . implantable loop recorder placement  05/25/2020   Medtronic Reveal Asher model [id number] (SN [id number]) implanted by Dr Johney Frame for Alleviate HF study  . RIGHT/LEFT HEART CATH AND CORONARY ANGIOGRAPHY N/A 10/24/2018   Procedure: RIGHT/LEFT HEART CATH AND CORONARY ANGIOGRAPHY;  Surgeon: Orpah Cobb, MD;  Location: [facility] INVASIVE CV LAB;  Service:  Cardiovascular;  Laterality: N/A;       Family History  Problem Relation Age of Onset  . Cancer Mother   . COPD Mother   . Diabetes Father     Social History   Tobacco Use  . Smoking status: Current Every Day Smoker    Packs/day: 1.50    Types: Cigarettes  . Smokeless tobacco: Never Used  Vaping Use  . Vaping Use: Never used  Substance Use Topics  . Alcohol use: Yes    Comment: occasional  . Drug use: No    Home Medications Prior to Admission medications   Medication Sig Start Date End Date Taking? Authorizing Provider  albuterol (PROVENTIL) (2.5 MG/3ML) 0.083% nebulizer solution TAKE 3 MLS (2.5 MG TOTAL) BY NEBULIZATION EVERY 2 (TWO) HOURS AS NEEDED FOR WHEEZING. 06/18/20   Jodelle Red, MD  albuterol (VENTOLIN HFA) 108 (90 Base) MCG/ACT inhaler Inhale 2 puffs into the lungs every 6 (six) hours as needed for wheezing or shortness of breath. 06/15/20   Arvilla Market, DO  aspirin EC 325 MG tablet Take 325 mg by mouth daily as needed for moderate pain.    [provider]  carvedilol (COREG) 12.5 MG tablet Take 1 tablet (12.5 mg total) by mouth 2 (two) times daily. 06/10/20 09/08/20  Jodelle Red, MD  furosemide (LASIX) 40 MG tablet As needed patient may take 40 MG Lasix PRN by mouth daily as directed  per Alleviate Research  HF Study PRN plan. 05/25/20 05/25/21  Allred, Fayrene Fearing, MD  potassium chloride SA (KLOR-CON M20) 20 MEQ tablet As needed patient may take 20 MEQ Potassium PRN by mouth daily as directed  per Alleviate Research HF Study PRN plan. 05/25/20 05/25/21  Allred, Fayrene Fearing, MD  torsemide (DEMADEX) 20 MG tablet Take 1 tablet (20 mg total) by mouth daily. 06/10/20 06/05/21  Jodelle Red, MD  atorvastatin (LIPITOR) 40 MG tablet Take 1 tablet (40 mg total) by mouth daily at 6 PM. Patient not taking: Reported on 03/27/2019 10/25/18 03/27/19  Orpah Cobb, MD    Allergies    Patient has no known allergies.  Review of Systems   Review  of Systems  Constitutional: Positive for fatigue. Negative for chills and fever.  HENT: Negative for congestion and facial swelling.   Eyes: Negative for discharge and visual disturbance.  Respiratory: Negative for shortness of breath.   Cardiovascular: Negative for chest pain and palpitations.  Gastrointestinal: Negative for abdominal pain, diarrhea and vomiting.  Musculoskeletal: Negative for arthralgias and myalgias.  Skin: Negative for color change and rash.  Neurological: Positive for syncope. Negative for tremors and headaches.  Psychiatric/Behavioral: Negative for confusion and dysphoric mood.    Physical Exam Updated Vital Signs BP 92/66   Pulse 99   Temp 97.7 F (36.5 C) (Oral)   Resp 13   SpO2 93%   Physical Exam Vitals and nursing note reviewed.  Constitutional:      Appearance: He is well-developed and well-nourished.  HENT:     Head: Normocephalic and atraumatic.  Eyes:     Extraocular Movements: EOM normal.     Pupils: Pupils are equal, round, and reactive to light.  Neck:     Vascular: No JVD.  Cardiovascular:     Rate and Rhythm: Normal rate and regular rhythm.     Heart sounds: No murmur heard. No friction rub. No gallop.   Pulmonary:     Effort: No respiratory distress.     Breath sounds: No wheezing.  Abdominal:     General: There is no distension.     Tenderness: There is no guarding or rebound.  Musculoskeletal:        General: Normal range of motion.     Cervical back: Normal range of motion and neck supple.     Right lower leg: Edema present.     Left lower leg: Edema present.     Comments: Diffuse edema.  Skin:    Coloration: Skin is not pale.     Findings: No rash.  Neurological:     Mental Status: He is alert and oriented to person, place, and time.  Psychiatric:        Mood and Affect: Mood and affect normal.        Behavior: Behavior normal.     ED Results / Procedures / Treatments   Labs (all labs ordered are listed, but only  abnormal results are displayed) Labs Reviewed  BASIC METABOLIC PANEL - Abnormal; Notable for the following components:      Result Value   Sodium 128 (*)    Chloride 85 (*)    Glucose, Bld 124 (*)    BUN 25 (*)    Creatinine, Ser 1.65 (*)    Calcium 8.3 (*)    GFR, Estimated 53 (*)    Anion gap 18 (*)    All other components within normal limits  CBC - Abnormal; Notable for the following components:   RBC 4.01 (*)  MCV 105.7 (*)    MCH 39.4 (*)    MCHC 37.3 (*)    RDW 15.9 (*)    All other components within normal limits  LACTIC ACID, PLASMA - Abnormal; Notable for the following components:   Lactic Acid, Venous 4.0 (*)    All other components within normal limits  URINALYSIS, ROUTINE W REFLEX MICROSCOPIC - Abnormal; Notable for the following components:   Specific Gravity, Urine 1.031 (*)    All other components within normal limits  I-STAT VENOUS BLOOD GAS, ED - Abnormal; Notable for the following components:   pH, Ven 7.510 (*)    pO2, Ven 180.0 (*)    Bicarbonate 40.3 (*)    TCO2 42 (*)    Acid-Base Excess 15.0 (*)    Sodium 126 (*)    Potassium 2.8 (*)    Calcium, Ion 0.97 (*)    All other components within normal limits  TROPONIN I (HIGH SENSITIVITY) - Abnormal; Notable for the following components:   Troponin I (High Sensitivity) 18 (*)    All other components within normal limits  TROPONIN I (HIGH SENSITIVITY) - Abnormal; Notable for the following components:   Troponin I (High Sensitivity) 27 (*)    All other components within normal limits  RESP PANEL BY RT-PCR (FLU A&B, COVID) ARPGX2  BRAIN NATRIURETIC PEPTIDE  LACTIC ACID, PLASMA  BRAIN NATRIURETIC PEPTIDE  RAPID URINE DRUG SCREEN, HOSP PERFORMED  PROCALCITONIN  CBC  CREATININE, SERUM  COMPREHENSIVE METABOLIC PANEL    EKG None  Radiology DG Chest 2 View  Result Date: 07/22/2020 CLINICAL DATA:  Syncope, left-sided rib pain, weakness and nausea. EXAM: CHEST - 2 VIEW COMPARISON:  02/02/2020 and CT  chest 11/10/2019. FINDINGS: Trachea is midline. Heart size normal. A loop recorder projects over the left hemithorax. Lungs are clear. No pleural fluid. Old right rib and right clavicle fractures. IMPRESSION: No acute findings. Electronically Signed   By: Leanna Battles M.D.   On: 07/22/2020 13:56   CT Angio Chest PE W and/or Wo Contrast  Result Date: 07/22/2020 CLINICAL DATA:  Pain following fall EXAM: CT ANGIOGRAPHY CHEST WITH CONTRAST TECHNIQUE: Multidetector CT imaging of the chest was performed using the standard protocol during bolus administration of intravenous contrast. Multiplanar CT image reconstructions and MIPs were obtained to evaluate the vascular anatomy. CONTRAST:  45mL OMNIPAQUE IOHEXOL 350 MG/ML SOLN COMPARISON:  Chest radiograph July 22, 2020; chest CT angiogram November 10, 2019 FINDINGS: Cardiovascular: There is no demonstrable pulmonary embolus. There is no thoracic aortic aneurysm or dissection. Visualized great vessels appear normal. No pericardial effusion or pericardial thickening is evident. Mediastinum/Nodes: Visualized thyroid appears normal. There are scattered subcentimeter mediastinal lymph nodes. Previous prominent aortopulmonary window lymph node now has a short axis diameter of 5 mm, within normal limits. There is no frank adenopathy by size criteria. There is a small hiatal hernia with thickening in the wall of the distal third of the esophagus. Lungs/Pleura: No appreciable pneumothorax. Trachea and major bronchial structures appear patent. Lungs are clear. No evident pleural effusions. Upper Abdomen: There is hepatic steatosis. Visualized upper abdominal structures otherwise appear unremarkable. Musculoskeletal: Several healed rib fractures are noted on the right. No acute appearing fracture is demonstrable. There are no blastic or lytic bone lesions. Several small Schmorl's nodes noted. No evident chest wall lesions. Review of the MIP images confirms the above findings.  IMPRESSION: 1. No demonstrable pulmonary embolus. No thoracic aortic aneurysm or dissection. 2.  No edema or airspace opacity.  No  pneumothorax. 3. Small hiatal hernia with distal esophageal wall thickening. Question chronic reflux esophagitis. 4.  No demonstrable adenopathy. 5.  Hepatic steatosis. Electronically Signed   By: Bretta Bang III M.D.   On: 07/22/2020 16:24    Procedures Procedures (including critical care time)  Medications Ordered in ED Medications  iohexol (OMNIPAQUE) 350 MG/ML injection 75 mL (has no administration in time range)  azithromycin (ZITHROMAX) 500 mg in sodium chloride 0.9 % 250 mL IVPB (500 mg Intravenous New Bag/Given 07/22/20 1834)  enoxaparin (LOVENOX) injection 40 mg (has no administration in time range)  acetaminophen (TYLENOL) tablet 650 mg (has no administration in time range)    Or  acetaminophen (TYLENOL) suppository 650 mg (has no administration in time range)  polyethylene glycol (MIRALAX / GLYCOLAX) packet 17 g (has no administration in time range)  ondansetron (ZOFRAN) tablet 4 mg (has no administration in time range)    Or  ondansetron (ZOFRAN) injection 4 mg (has no administration in time range)  thiamine tablet 100 mg (has no administration in time range)  nicotine (NICODERM CQ - dosed in mg/24 hours) patch 14 mg (has no administration in time range)  carvedilol (COREG) tablet 12.5 mg (has no administration in time range)  sodium chloride 0.9 % bolus 1,000 mL (0 mLs Intravenous Stopped 07/22/20 1855)  iohexol (OMNIPAQUE) 350 MG/ML injection 80 mL (80 mLs Intravenous Contrast Given 07/22/20 1609)  cefTRIAXone (ROCEPHIN) 1 g in sodium chloride 0.9 % 100 mL IVPB (0 g Intravenous Stopped 07/22/20 1832)    ED Course  I have reviewed the triage vital signs and the nursing notes.  Pertinent labs & imaging results that were available during my care of the patient were reviewed by me and considered in my medical decision making (see chart for  details).    MDM Rules/Calculators/A&P                          45 yo M with a chief complaints of a syncopal event.  Complaining mostly of left-sided rib pain.  He thinks maybe he broke a rib when he fell today.  Episode occurred on exertion.  Has history of PE and not on anticoagulation.  Plain film with out obvious fracture, no pneumothorax.  Will obtain a CT scan of the chest.  The patient when I was in the room was also hypotensive.  Blood pressures in the 70s on repeat.  We will give a bolus of IV fluids.  Lab work with hyponatremia and hypochloremia.  AKI. Trop + though midly so.  CTA chest negative for PE or rib fx.  Will discuss with cards.   Patient's lactate has resulted elevated at 4.  Patient's VBG without hypercarbia.  Troponin initially at 15 and then slightly higher on recheck at 27.  Not endorsing any specific chest symptoms.   CRITICAL CARE Performed by: Rae Roam   Total critical care time: 35 minutes  Critical care time was exclusive of separately billable procedures and treating other patients.  Critical care was necessary to treat or prevent imminent or life-threatening deterioration.  Critical care was time spent personally by me on the following activities: development of treatment plan with patient and/or surrogate as well as nursing, discussions with consultants, evaluation of patient's response to treatment, examination of patient, obtaining history from patient or surrogate, ordering and performing treatments and interventions, ordering and review of laboratory studies, ordering and review of radiographic studies, pulse oximetry and re-evaluation of  patient's condition.  The patients results and plan were reviewed and discussed.   Any x-rays performed were independently reviewed by myself.   Differential diagnosis were considered with the presenting HPI.  Medications  iohexol (OMNIPAQUE) 350 MG/ML injection 75 mL (has no administration in time  range)  azithromycin (ZITHROMAX) 500 mg in sodium chloride 0.9 % 250 mL IVPB (500 mg Intravenous New Bag/Given 07/22/20 1834)  enoxaparin (LOVENOX) injection 40 mg (has no administration in time range)  acetaminophen (TYLENOL) tablet 650 mg (has no administration in time range)    Or  acetaminophen (TYLENOL) suppository 650 mg (has no administration in time range)  polyethylene glycol (MIRALAX / GLYCOLAX) packet 17 g (has no administration in time range)  ondansetron (ZOFRAN) tablet 4 mg (has no administration in time range)    Or  ondansetron (ZOFRAN) injection 4 mg (has no administration in time range)  thiamine tablet 100 mg (has no administration in time range)  nicotine (NICODERM CQ - dosed in mg/24 hours) patch 14 mg (has no administration in time range)  carvedilol (COREG) tablet 12.5 mg (has no administration in time range)  sodium chloride 0.9 % bolus 1,000 mL (0 mLs Intravenous Stopped 07/22/20 1855)  iohexol (OMNIPAQUE) 350 MG/ML injection 80 mL (80 mLs Intravenous Contrast Given 07/22/20 1609)  cefTRIAXone (ROCEPHIN) 1 g in sodium chloride 0.9 % 100 mL IVPB (0 g Intravenous Stopped 07/22/20 1832)    Vitals:   07/22/20 1730 07/22/20 1745 07/22/20 1800 07/22/20 1909  BP: 129/67 92/66    Pulse: 99 (!) 104 99   Resp: 17 13    Temp:    97.7 F (36.5 C)  TempSrc:    Oral  SpO2: (!) 74% 90% 93%     Final diagnoses:  Syncope and collapse  Troponin level elevated    Admission/ observation were discussed with the admitting physician, patient and/or family and they are comfortable with the plan.   Final Clinical Impression(s) / ED Diagnoses Final diagnoses:  Syncope and collapse  Troponin level elevated    Rx / DC Orders ED Discharge Orders    None       Deno Etienne, DO 07/22/20 1922

## 2020-07-22 NOTE — Research (Signed)
Alleviate Research Study  Manual Carelink Transmission

## 2020-07-22 NOTE — Research (Addendum)
Alleviate Research Study 2 Month Follow-up  Called patient for 2 month follow-up, his mother answer and stated that the patient has been having "passing out episodes" she states the the most recent was this morning and that he is now passing out with and without the coughing spells. I advised her that he needs to be seen ASAP and she is concerned that the patient does not want to go.  I have routed a message to Dr [name] in regard to this as well. No activity on most recent LINQ download,        No medication changes to report or adverse events prior to this. Patient admitted to hospital, will continue to follow.

## 2020-07-22 NOTE — ED Triage Notes (Signed)
Pt arrives to ED via [facility] from home where he passed out at home while walking. He states he is currently wearing a heart monitor due to frequent syncope normally associated with cough.  He came to ED due to his mom states he was "out for 7-8 minutes" and he was having left sided rib pain.

## 2020-07-22 NOTE — ED Notes (Signed)
MD made aware of pt being placed on 15L NRB by prior RN. Pt dry heaving and had one vomitus episode. Meds ordered and given.

## 2020-07-22 NOTE — ED Notes (Signed)
Pt placed back on 15L NRB due to O2 saturations dropping to mid 80's while pt tries to sleep.

## 2020-07-22 NOTE — H&P (Addendum)
History and Physical    Jesus Fuller WUJ:[id number] DOB: 10/08/1975 DOA: 07/22/2020  PCP: Arvilla Market, DO   Patient coming from: Home  I have personally briefly reviewed patient's old medical records in [facility]  Chief Complaint: Syncope  HPI: Jesus Fuller is a 45 y.o. male with medical history significant of chronic systoic and diastolic HF, NICM with improved EF on last echo in July, HTN,, LLE dvt now off Eliquis, came to ED after having a syncopal episode.  Patient has a history of an ICM with EF of 35% in 2020, repeat echo in July 2021 with EF of 60 to 65%.  Patient has an history of syncopal episodes most of the time associated with cough and diagnosed with posttussive syncope.  Jesus Fuller wears a monitor.  Today while Jesus Fuller was walking in kitchen Jesus Fuller collapsed after coughing, having chest pain with fall and thought Jesus Fuller fractured his ribs so came to ED. patient really thinks that Jesus Fuller is having more frequent syncopal episodes. Patient was having left lower lateral pain which increased with coughing and tender.  No nausea or vomiting.  Jesus Fuller was thinking that Jesus Fuller is little short of breath, no orthopnea or PND.  Patient has an history of chronic cough for the past 4 to 47-month, continue to smoke 1 pack/day, and drink 1 pint every 2 to 3 days stating that Jesus Fuller has decreased his drinking.  No fever or chills, no congestion, no sick contacts. Patient denies any urinary symptoms or recent change in his bowel habits or appetite.  Patient is unvaccinated.  ED Course: On arrival patient was afebrile, hypotensive with blood pressure of 70/56 and mildly hypoxic requiring 2 L of oxygen.  Labs positive for lactic acid of 4, troponin of 27, sodium of 128, chloride of 85, BUN 25, creatinine of 1.65 with baseline around 1, COVID-19 PCR negative, CTA was negative for PE or any other airspace opacity.  Cardiology was consulted from ED and Jesus Fuller was given 1 dose of Zithromax and ceftriaxone along with 1 L bolus  of normal saline.  Review of Systems: As per HPI otherwise 10 point review of systems negative.   Past Medical History:  Diagnosis Date  . Alcoholism /alcohol abuse   . Aortic atherosclerosis (HCC)   . Asthma   . Chest pain with normal coronary angiography 10/2018  . Chronic combined systolic and diastolic CHF (congestive heart failure) (HCC)   . Fatty liver   . Hypertension    Takes no medicine  . Left leg DVT (HCC) 09/2019  . NICM (nonischemic cardiomyopathy) (HCC)   . Tobacco abuse     Past Surgical History:  Procedure Laterality Date  . implantable loop recorder placement  05/25/2020   [employer] Reveal [place] model [id number] (SN [id number]) implanted by Dr Johney Frame for Alleviate HF study  . RIGHT/LEFT HEART CATH AND CORONARY ANGIOGRAPHY N/A 10/24/2018   Procedure: RIGHT/LEFT HEART CATH AND CORONARY ANGIOGRAPHY;  Surgeon: Orpah Cobb, MD;  Location: [facility];  Service: Cardiovascular;  Laterality: N/A;     reports that Jesus Fuller has been smoking cigarettes. Jesus Fuller has been smoking about 1.50 packs per day. Jesus Fuller has never used smokeless tobacco. Jesus Fuller reports current alcohol use. Jesus Fuller reports that Jesus Fuller does not use drugs.  No Known Allergies  Family History  Problem Relation Age of Onset  . Cancer Mother   . COPD Mother   . Diabetes Father     Prior to Admission medications   Medication Sig Start  Date End Date Taking? Authorizing Provider  albuterol (PROVENTIL) (2.5 MG/3ML) 0.083% nebulizer solution TAKE 3 MLS (2.5 MG TOTAL) BY NEBULIZATION EVERY 2 (TWO) HOURS AS NEEDED FOR WHEEZING. 06/18/20   Jodelle Red, MD  albuterol (VENTOLIN HFA) 108 (90 Base) MCG/ACT inhaler Inhale 2 puffs into the lungs every 6 (six) hours as needed for wheezing or shortness of breath. 06/15/20   Arvilla Market, DO  aspirin EC 325 MG tablet Take 325 mg by mouth daily as needed for moderate pain.    [provider]  carvedilol (COREG) 12.5 MG tablet Take 1 tablet (12.5 mg total) by  mouth 2 (two) times daily. 06/10/20 09/08/20  Jodelle Red, MD  furosemide (LASIX) 40 MG tablet As needed patient may take 40 MG Lasix PRN by mouth daily as directed  per Alleviate Research HF Study PRN plan. 05/25/20 05/25/21  Allred, Fayrene Fearing, MD  potassium chloride SA (KLOR-CON M20) 20 MEQ tablet As needed patient may take 20 MEQ Potassium PRN by mouth daily as directed  per Alleviate Research HF Study PRN plan. 05/25/20 05/25/21  Allred, Fayrene Fearing, MD  torsemide (DEMADEX) 20 MG tablet Take 1 tablet (20 mg total) by mouth daily. 06/10/20 06/05/21  Jodelle Red, MD  atorvastatin (LIPITOR) 40 MG tablet Take 1 tablet (40 mg total) by mouth daily at 6 PM. Patient not taking: Reported on 03/27/2019 10/25/18 03/27/19  Orpah Cobb, MD    Physical Exam: Vitals:   07/22/20 1715 07/22/20 1730 07/22/20 1745 07/22/20 1800  BP: 124/71 129/67 92/66   Pulse: 96 99 (!) 104 99  Resp: 18 17 13    Temp:      TempSrc:      SpO2: 93% (!) 74% 90% 93%    General: Vital signs reviewed.  Patient is well-developed and morbidly obese, in no acute distress and cooperative with exam.  Head: Normocephalic and atraumatic. Eyes: EOMI, conjunctivae normal, no scleral icterus.  ENMT: Mucous membranes are moist. Neck: Supple, trachea midline, normal ROM,  Cardiovascular: RRR, S1 normal, S2 normal, no murmurs, gallops, or rubs. Pulmonary/Chest: Clear to auscultation bilaterally, no wheezes, rales, or rhonchi. Abdominal: Soft, non-tender, non-distended, BS +, Extremities: No lower extremity edema bilaterally,  pulses symmetric and intact bilaterally. No cyanosis or clubbing. Neurological: A&O x3, Strength is normal and symmetric bilaterally, cranial nerve II-XII are grossly intact, no focal motor deficit, sensory intact to light touch bilaterally.  Skin: Warm, dry and intact. No rashes or erythema. Psychiatric: Normal mood and affect.   Labs on Admission: I have personally reviewed following labs and imaging  studies  CBC: Recent Labs  Lab 07/22/20 1326 07/22/20 1611  WBC 6.2  --   HGB 15.8 16.3  HCT 42.4 48.0  MCV 105.7*  --   PLT 169  --    Basic Metabolic Panel: Recent Labs  Lab 07/22/20 1326 07/22/20 1611  NA 128* 126*  K 3.9 2.8*  CL 85*  --   CO2 25  --   GLUCOSE 124*  --   BUN 25*  --   CREATININE 1.65*  --   CALCIUM 8.3*  --    GFR: CrCl cannot be calculated (Unknown ideal weight.). Liver Function Tests: No results for input(s): AST, ALT, ALKPHOS, BILITOT, PROT, ALBUMIN in the last 168 hours. No results for input(s): LIPASE, AMYLASE in the last 168 hours. No results for input(s): AMMONIA in the last 168 hours. Coagulation Profile: No results for input(s): INR, PROTIME in the last 168 hours. Cardiac Enzymes: No results for input(s):  CKTOTAL, CKMB, CKMBINDEX, TROPONINI in the last 168 hours. BNP (last 3 results) Recent Labs    05/25/20 1138  PROBNP 409*   HbA1C: No results for input(s): HGBA1C in the last 72 hours. CBG: No results for input(s): GLUCAP in the last 168 hours. Lipid Profile: No results for input(s): CHOL, HDL, LDLCALC, TRIG, CHOLHDL, LDLDIRECT in the last 72 hours. Thyroid Function Tests: No results for input(s): TSH, T4TOTAL, FREET4, T3FREE, THYROIDAB in the last 72 hours. Anemia Panel: No results for input(s): VITAMINB12, FOLATE, FERRITIN, TIBC, IRON, RETICCTPCT in the last 72 hours. Urine analysis:    Component Value Date/Time   COLORURINE YELLOW 11/10/2019 1438   APPEARANCEUR CLEAR 11/10/2019 1438   LABSPEC 1.015 11/10/2019 1438   PHURINE 7.0 11/10/2019 1438   GLUCOSEU NEGATIVE 11/10/2019 1438   HGBUR NEGATIVE 11/10/2019 1438   BILIRUBINUR NEGATIVE 11/10/2019 1438   KETONESUR NEGATIVE 11/10/2019 1438   PROTEINUR NEGATIVE 11/10/2019 1438   NITRITE NEGATIVE 11/10/2019 1438   LEUKOCYTESUR NEGATIVE 11/10/2019 1438    Radiological Exams on Admission: DG Chest 2 View  Result Date: 07/22/2020 CLINICAL DATA:  Syncope, left-sided rib  pain, weakness and nausea. EXAM: CHEST - 2 VIEW COMPARISON:  02/02/2020 and CT chest 11/10/2019. FINDINGS: Trachea is midline. Heart size normal. A loop recorder projects over the left hemithorax. Lungs are clear. No pleural fluid. Old right rib and right clavicle fractures. IMPRESSION: No acute findings. Electronically Signed   By: Lorin Picket M.D.   On: 07/22/2020 13:56   CT Angio Chest PE W and/or Wo Contrast  Result Date: 07/22/2020 CLINICAL DATA:  Pain following fall EXAM: CT ANGIOGRAPHY CHEST WITH CONTRAST TECHNIQUE: Multidetector CT imaging of the chest was performed using the standard protocol during bolus administration of intravenous contrast. Multiplanar CT image reconstructions and MIPs were obtained to evaluate the vascular anatomy. CONTRAST:  17mL OMNIPAQUE IOHEXOL 350 MG/ML SOLN COMPARISON:  Chest radiograph July 22, 2020; chest CT angiogram November 10, 2019 FINDINGS: Cardiovascular: There is no demonstrable pulmonary embolus. There is no thoracic aortic aneurysm or dissection. Visualized great vessels appear normal. No pericardial effusion or pericardial thickening is evident. Mediastinum/Nodes: Visualized thyroid appears normal. There are scattered subcentimeter mediastinal lymph nodes. Previous prominent aortopulmonary window lymph node now has a short axis diameter of 5 mm, within normal limits. There is no frank adenopathy by size criteria. There is a small hiatal hernia with thickening in the wall of the distal third of the esophagus. Lungs/Pleura: No appreciable pneumothorax. Trachea and major bronchial structures appear patent. Lungs are clear. No evident pleural effusions. Upper Abdomen: There is hepatic steatosis. Visualized upper abdominal structures otherwise appear unremarkable. Musculoskeletal: Several healed rib fractures are noted on the right. No acute appearing fracture is demonstrable. There are no blastic or lytic bone lesions. Several small Schmorl's nodes noted. No evident  chest wall lesions. Review of the MIP images confirms the above findings. IMPRESSION: 1. No demonstrable pulmonary embolus. No thoracic aortic aneurysm or dissection. 2.  No edema or airspace opacity.  No pneumothorax. 3. Small hiatal hernia with distal esophageal wall thickening. Question chronic reflux esophagitis. 4.  No demonstrable adenopathy. 5.  Hepatic steatosis. Electronically Signed   By: Lowella Grip III M.D.   On: 07/22/2020 16:24    EKG: Independently reviewed.  Sinus rhythm, no ST changes.  Assessment/Plan Active Problems:   Syncope   Syncope.  Patient with diagnosis of post tussive syncope, think that Jesus Fuller is having more episodes as Jesus Fuller is having more cough for the past  4 to 5 months. Cardiology was consulted and their advice admission for observation so they can interrogate his device tomorrow morning. Echocardiogram was ordered by cardiology. Check UA and UDS  Lactic acidosis.  Patient appears little dry and Jesus Fuller was hypoxic on arrival.  Jesus Fuller was afebrile and no leukocytosis.  Received a dose of ceftriaxone and Zithromax. -Giving him some fluid. -Monitor lactic acid -Check procalcitonin -I will not continue antibiotics at this time unless procalcitonin is elevated.  Elevated troponin.  No significant EKG changes.  Mildly elevated troponin that can be secondary to demand ischemia. Patient has a prior diagnosis of nonischemic cardiomyopathy. -Check BNP. -Continue to monitor  Nonischemic cardiomyopathy.  EF was improved on recent echo.  Was compliant with his diuretic and was taking both torsemide and Lasix?? -Repeat echocardiogram. -Check BNP -Continue home dose of Coreg -Holding home diuretic as Jesus Fuller appears dry.  Hyponatremia.  Patient with sodium of 128, Jesus Fuller was taking both Lasix and torsemide and appears little dry. -Giving some fluid. -Continue to monitor.  Nicotine dependence.  Counseling was provided. -Nicotine patch.   DVT prophylaxis: Lovenox Code Status:  Full code Family Communication: Discussed with patient Disposition Plan: Home Consults called: Cardiology Admission status: Observation   Arnetha Courser MD [facility]  If 7PM-7AM, please contact night-coverage [email or web address]  07/22/2020, 7:02 PM   This record has been created using Conservation officer, historic buildings. Errors have been sought and corrected,but may not always be located. Such creation errors do not reflect on the standard of care.

## 2020-07-22 NOTE — ED Notes (Signed)
Notified Dr. Adela Lank of critical lactic acid of 4.0. No new orders at this time.

## 2020-07-22 NOTE — Telephone Encounter (Signed)
Spoke with pt's mother who report for the past few days pt continues to have episodes of passing out. She report this morning during syncopal episode, pt was difficult to arouse and she feels he may have broken a rib. She report pt is still currently dizzy and crying reporting pain.  Nurse recommended pt report to ER for further evaluations.  Mother verbalized understanding. A follow up appointment was scheduled for 07/27/20 at 3:20 pm.

## 2020-07-22 NOTE — Consult Note (Addendum)
Cardiology Consultation:   Patient ID: Jesus Fuller MRN: [id number]; DOB: 1975-11-30  Admit date: 07/22/2020 Date of Consult: 07/22/2020  Primary Care Provider: Nicolette Bang, DO [facility] Cardiologist: Buford Dresser, MD  [facility] Electrophysiologist:  None    Patient Profile:   Jesus Fuller is a 45 y.o. male with a hx of chronic systoic and diastolic HF, NICM with improved EF on last echo in July, HTN,, LLe dvt now off Eliquis who is being seen today for the evaluation of syncope at the request of Dr. Tyrone Nine.  History of Present Illness:   Jesus Fuller with hx of syncope and wearing a monitor and ST.  With the syncope he thought he broke a rib so presented to ER.  He was having more syncope than what he thought was normal.  + palpable chest pain.    Has significant pain with moving in bed.    Prior hx of NICM with EF 35% in 2020 and with treatment up to EF of 60-65% 01/2020.   ILR through research with hx of post tussive syncope.  On strips from today ST though one strip possible a flutter.  But no slow HR and no extremely fast HR       EKG:  The EKG was personally reviewed and demonstrates:  ST at 102 RAD borderline Qtc prolongation. Telemetry:  Telemetry was personally reviewed and demonstrates: SR to ST  Na 128, K+ 3.9 and 2.8 Cr 1.65 ca+ 8.3   Hs troponin 18 and 27  Lactic acid 4.0  covid neg. WBC 6.2, Hgb 15.8, plts 169   CTA chest  IMPRESSION: 1. No demonstrable pulmonary embolus. No thoracic aortic aneurysm or dissection.   2.  No edema or airspace opacity.  No pneumothorax.   3. Small hiatal hernia with distal esophageal wall thickening. Question chronic reflux esophagitis.   4.  No demonstrable adenopathy.   5.  Hepatic steatosis.  CXR NAD.    Pt with pain with any upper body movement from fall no rib fx noted on CT does have old rib fxs.   He is down to 1 ppd tobacco from 2-3 pks and ETOH is down to 1 pint every 2-3 days.   He  takes lasix 40 daily and torsemide 20 daily per pt.  Past Medical History:  Diagnosis Date   Alcoholism /alcohol abuse    Aortic atherosclerosis (HCC)    Asthma    Chest pain with normal coronary angiography 10/2018   Chronic combined systolic and diastolic CHF (congestive heart failure) ([place])    Fatty liver    Hypertension    Takes no medicine   Left leg DVT ([place]) 09/2019   NICM (nonischemic cardiomyopathy) ([place])    Tobacco abuse     Past Surgical History:  Procedure Laterality Date   implantable loop recorder placement  05/25/2020   Medtronic Reveal [place] model LNQ11 (SN [id number]) implanted by Dr Rayann Heman for Alleviate HF study   RIGHT/LEFT HEART CATH AND CORONARY ANGIOGRAPHY N/A 10/24/2018   Procedure: RIGHT/LEFT HEART CATH AND CORONARY ANGIOGRAPHY;  Surgeon: Dixie Dials, MD;  Location: [place] [facility];  Service: Cardiovascular;  Laterality: N/A;     Home Medications:  Prior to Admission medications   Medication Sig Start Date End Date Taking? Authorizing Provider  albuterol (PROVENTIL) (2.5 MG/3ML) 0.083% nebulizer solution TAKE 3 MLS (2.5 MG TOTAL) BY NEBULIZATION EVERY 2 (TWO) HOURS AS NEEDED FOR WHEEZING. 06/18/20   Buford Dresser, MD  albuterol (VENTOLIN HFA)  108 (90 Base) MCG/ACT inhaler Inhale 2 puffs into the lungs every 6 (six) hours as needed for wheezing or shortness of breath. 06/15/20   Arvilla Market, DO  aspirin EC 325 MG tablet Take 325 mg by mouth daily as needed for moderate pain.    [provider]  carvedilol (COREG) 12.5 MG tablet Take 1 tablet (12.5 mg total) by mouth 2 (two) times daily. 06/10/20 09/08/20  Jodelle Red, MD  furosemide (LASIX) 40 MG tablet As needed patient may take 40 MG Lasix PRN by mouth daily as directed  per Alleviate Research HF Study PRN plan. 05/25/20 05/25/21  Allred, Fayrene Fearing, MD  potassium chloride SA (KLOR-CON M20) 20 MEQ tablet As needed patient may take 20 MEQ Potassium PRN by mouth daily as  directed  per Alleviate Research HF Study PRN plan. 05/25/20 05/25/21  Allred, Fayrene Fearing, MD  torsemide (DEMADEX) 20 MG tablet Take 1 tablet (20 mg total) by mouth daily. 06/10/20 06/05/21  Jodelle Red, MD  atorvastatin (LIPITOR) 40 MG tablet Take 1 tablet (40 mg total) by mouth daily at 6 PM. Patient not taking: Reported on 03/27/2019 10/25/18 03/27/19  Orpah Cobb, MD    Inpatient Medications: Scheduled Meds:   Continuous Infusions:  azithromycin (ZITHROMAX) 500 MG IVPB (Vial-Mate [employer]or)     cefTRIAXone (ROCEPHIN)  IV     PRN Meds: iohexol  Allergies:   No Known Allergies  Social History:   Social History   Socioeconomic History   Marital status: Single    Spouse name: Not on file   Number of children: Not on file   Years of education: Not on file   Highest education level: Not on file  Occupational History   Not on file  Tobacco Use   Smoking status: Current Every Day Smoker    Packs/day: 1.50    Types: Cigarettes   Smokeless tobacco: Never Used  Vaping Use   Vaping Use: Never used  Substance and Sexual Activity   Alcohol use: Yes    Comment: occasional   Drug use: No   Sexual activity: Not on file  Other Topics Concern   Not on file  Social History Narrative   Not on file   Social Determinants of Health   Financial Resource Strain: Not on file  Food Insecurity: Not on file  Transportation Needs: Not on file  Physical Activity: Not on file  Stress: Not on file  Social Connections: Not on file  Intimate Partner Violence: Not on file    Family History:    Family History  Problem Relation Age of Onset   Cancer Mother    COPD Mother    Diabetes Father      ROS:  Please see the history of present illness.  General:no colds or fevers, no weight changes Skin:no rashes or ulcers HEENT:no blurred vision, no congestion CV:see HPI PUL:see HPI GI:no diarrhea constipation or melena, no indigestion GU:no hematuria, no dysuria MS:no joint pain, no  claudication Neuro:no syncope, no lightheadedness Endo:no diabetes, no thyroid disease   All other ROS reviewed and negative.     Physical Exam/Data:   Vitals:   07/22/20 1630 07/22/20 1645 07/22/20 1700 07/22/20 1715  BP: 123/67 111/68 118/64 124/71  Pulse: 96 93 (!) 105 96  Resp: 13 (!) 22 17 18   Temp:      TempSrc:      SpO2: 94% 94% 92% 93%   No intake or output data in the 24 hours ending 07/22/20 1740 Last 3  Weights 06/10/2020 05/25/2020 05/25/2020  Weight (lbs) 250 lb 254 lb 254 lb  Weight (kg) 113.399 kg 115.214 kg 115.214 kg     There is no height or weight on file to calculate BMI.  General:  Well nourished, well developed, + reproducible chest pain HEENT: normal Lymph: no adenopathy Neck: no JVD Endocrine:  No thryomegaly Vascular: No carotid bruits; pedal pulses 2+ bilaterally without bruits  Cardiac:  normal S1, S2; RRR; no murmur , + chest pain to chest wall palpation   Lungs:  diminished to auscultation bilaterally, + wheezing, few rhonchi or rales  Abd: soft, nontender, no hepatomegaly  Ext: tr edema Musculoskeletal:  No deformities, BUE and BLE strength normal and equal Skin: warm and dry  Neuro:  alert and oriented X 3, no focal abnormalities noted Psych:  Normal affect  Relevant CV Studies: limited echo 02/03/20 IMPRESSIONS     1. Left ventricular ejection fraction, by estimation, is 60 to 65%. The  left ventricle has normal function. The left ventricle has no regional  wall motion abnormalities.   2. Right ventricular systolic function is normal. The right ventricular  size is normal.   Conclusion(s)/Recommendation(s): Limted echo for function. Normal to  hyperdynamic LV EF without focal WMA. Normal RV.   FINDINGS   Left Ventricle: Left ventricular ejection fraction, by estimation, is 60  to 65%. The left ventricle has normal function. The left ventricle has no  regional wall motion abnormalities. Definity contrast agent was given IV  to  delineate the left ventricular   endocardial borders.   Right Ventricle: The right ventricular size is normal. Right ventricular  systolic function is normal.   RIGHT ATRIUM           Index  RA Area:     13.70 cm  RA Volume:   30.80 ml  14.25 ml/m    Echo 11/11/19   1. Left ventricular ejection fraction, by estimation, is 45 to 50%. The  left ventricle has mildly decreased function. The left ventricle  demonstrates regional wall motion abnormalities (see scoring  diagram/findings for description). Left ventricular  diastolic parameters are consistent with Grade II diastolic dysfunction  (pseudonormalization). There is mild hypokinesis of the left ventricular,  basal anterior wall, septal wall and inferior wall.   2. Right ventricular systolic function is mildly reduced. The right  ventricular size is mildly enlarged.   3. Left atrial size was moderately dilated.   4. Right atrial size was moderately dilated.   5. The mitral valve is normal in structure. Trivial mitral valve  regurgitation.   6. The aortic valve is normal in structure. Aortic valve regurgitation is  not visualized.   7. The inferior vena cava is normal in size with greater than 50%  respiratory variability, suggesting right atrial pressure of 3 mmHg.    Echo 10/25/18 1. Mild hypokinesis of the left ventricular, entire anteroseptal wall and  inferolateral wall.   2. The left ventricle has moderately reduced systolic function, with an  ejection fraction of 35-40%. The cavity size was normal. Left ventricular  diastolic Doppler parameters are consistent with impaired relaxation.   3. The right ventricle has normal systolic function. The cavity was  normal. There is no increase in right ventricular wall thickness.    R/LHC 10/24/18 Normal cors, normal LVEDP. CO 5.3, CI 2.61, RA mean 0, PA 21/7 (13). Wedge mean 1. LVEDP 4. Ao 122/81.    Laboratory Data:  High Sensitivity Troponin:   Recent Labs  Lab  07/22/20 1326 07/22/20 1556  TROPONINIHS 18* 27*     Chemistry Recent Labs  Lab 07/22/20 1326 07/22/20 1611  NA 128* 126*  K 3.9 2.8*  CL 85*  --   CO2 25  --   GLUCOSE 124*  --   BUN 25*  --   CREATININE 1.65*  --   CALCIUM 8.3*  --   GFRNONAA 53*  --   ANIONGAP 18*  --     No results for input(s): PROT, ALBUMIN, AST, ALT, ALKPHOS, BILITOT in the last 168 hours. Hematology Recent Labs  Lab 07/22/20 1326 07/22/20 1611  WBC 6.2  --   RBC 4.01*  --   HGB 15.8 16.3  HCT 42.4 48.0  MCV 105.7*  --   MCH 39.4*  --   MCHC 37.3*  --   RDW 15.9*  --   PLT 169  --    BNPNo results for input(s): BNP, PROBNP in the last 168 hours.  DDimer No results for input(s): DDIMER in the last 168 hours.   Radiology/Studies:  DG Chest 2 View  Result Date: 07/22/2020 CLINICAL DATA:  Syncope, left-sided rib pain, weakness and nausea. EXAM: CHEST - 2 VIEW COMPARISON:  02/02/2020 and CT chest 11/10/2019. FINDINGS: Trachea is midline. Heart size normal. A loop recorder projects over the left hemithorax. Lungs are clear. No pleural fluid. Old right rib and right clavicle fractures. IMPRESSION: No acute findings. Electronically Signed   By: Leanna Battles M.D.   On: 07/22/2020 13:56   CT Angio Chest PE W and/or Wo Contrast  Result Date: 07/22/2020 CLINICAL DATA:  Pain following fall EXAM: CT ANGIOGRAPHY CHEST WITH CONTRAST TECHNIQUE: Multidetector CT imaging of the chest was performed using the standard protocol during bolus administration of intravenous contrast. Multiplanar CT image reconstructions and MIPs were obtained to evaluate the vascular anatomy. CONTRAST:  22mL OMNIPAQUE IOHEXOL 350 MG/ML SOLN COMPARISON:  Chest radiograph July 22, 2020; chest CT angiogram November 10, 2019 FINDINGS: Cardiovascular: There is no demonstrable pulmonary embolus. There is no thoracic aortic aneurysm or dissection. Visualized great vessels appear normal. No pericardial effusion or pericardial thickening is  evident. Mediastinum/Nodes: Visualized thyroid appears normal. There are scattered subcentimeter mediastinal lymph nodes. Previous prominent aortopulmonary window lymph node now has a short axis diameter of 5 mm, within normal limits. There is no frank adenopathy by size criteria. There is a small hiatal hernia with thickening in the wall of the distal third of the esophagus. Lungs/Pleura: No appreciable pneumothorax. Trachea and major bronchial structures appear patent. Lungs are clear. No evident pleural effusions. Upper Abdomen: There is hepatic steatosis. Visualized upper abdominal structures otherwise appear unremarkable. Musculoskeletal: Several healed rib fractures are noted on the right. No acute appearing fracture is demonstrable. There are no blastic or lytic bone lesions. Several small Schmorl's nodes noted. No evident chest wall lesions. Review of the MIP images confirms the above findings. IMPRESSION: 1. No demonstrable pulmonary embolus. No thoracic aortic aneurysm or dissection. 2.  No edema or airspace opacity.  No pneumothorax. 3. Small hiatal hernia with distal esophageal wall thickening. Question chronic reflux esophagitis. 4.  No demonstrable adenopathy. 5.  Hepatic steatosis. Electronically Signed   By: Bretta Bang III M.D.   On: 07/22/2020 16:24     Assessment and Plan:   Syncope with known cough syncope has ILR and no brady was seen - will need to eval further in AM when research available.  Chest pain, due to fall and chest wall  pain.   normal cath in 2020 for CAD with dx with NICM ETOH and tobacco use though has decreased. May need ativan protocol.  Chronic systolic and diastolic HF on both lasix and torsemide with hyponatremia now.  EF has improved to normal  BP soft monitor         New York Heart Association (NYHA) Functional Class NYHA Class I        For questions or updates, please contact [facility] Please consult [email or web address] for contact info under     Signed, Nada Boozer, NP  07/22/2020 5:40 PM  Agree with note written by Nada Boozer RNP  We were asked to see this 45 y/o WM pt of Dr Di Kindle with known cough syncope and HRpEF. He has known nl cors by recent cath and LV dysf which by recent 2D has normalized on GDOMT. 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 He had several episodes of syncope, fell and injured his chest wall prompting him to seek medical care. He complains of chest wall tenderness and pleuritic CP although Chest CT neg for PE or rib Fx. CXR NAD and EKG w/o acute changes. Trop low and flat. He has wheezes and rhonchi on exam and uses inhaled bronchodilators at home. He is also on Lasix and torsemide for DD. Labs are notable for decreased K and NA as well as increased lactate. Covid neg. He appears toxic with URI Sx which may explain his increased lactate levels. Doubt cardiac etiol. Will re check 2D. Loop recorder did not reveal any arrhythmias that could cause syncope. No further cardiac W/U required at this time.   Nanetta Batty 07/23/2020 6:46 AM

## 2020-07-23 ENCOUNTER — Inpatient Hospital Stay (HOSPITAL_COMMUNITY): Payer: Medicaid Other

## 2020-07-23 ENCOUNTER — Encounter (HOSPITAL_COMMUNITY): Payer: Self-pay | Admitting: Internal Medicine

## 2020-07-23 ENCOUNTER — Other Ambulatory Visit: Payer: Self-pay

## 2020-07-23 LAB — MAGNESIUM: Magnesium: 1.1 mg/dL — ABNORMAL LOW (ref 1.7–2.4)

## 2020-07-23 LAB — COMPREHENSIVE METABOLIC PANEL
ALT: 71 U/L — ABNORMAL HIGH (ref 0–44)
AST: 108 U/L — ABNORMAL HIGH (ref 15–41)
Albumin: 2.6 g/dL — ABNORMAL LOW (ref 3.5–5.0)
Alkaline Phosphatase: 142 U/L — ABNORMAL HIGH (ref 38–126)
Anion gap: 18 — ABNORMAL HIGH (ref 5–15)
BUN: 23 mg/dL — ABNORMAL HIGH (ref 6–20)
CO2: 28 mmol/L (ref 22–32)
Calcium: 8.3 mg/dL — ABNORMAL LOW (ref 8.9–10.3)
Chloride: 85 mmol/L — ABNORMAL LOW (ref 98–111)
Creatinine, Ser: 1.4 mg/dL — ABNORMAL HIGH (ref 0.61–1.24)
GFR, Estimated: >60 mL/min (ref >60)
Glucose, Bld: 105 mg/dL — ABNORMAL HIGH (ref 70–99)
Potassium: 2.8 mmol/L — ABNORMAL LOW (ref 3.5–5.1)
Sodium: 131 mmol/L — ABNORMAL LOW (ref 135–145)
Total Bilirubin: 2.3 mg/dL — ABNORMAL HIGH (ref 0.3–1.2)
Total Protein: 5.5 g/dL — ABNORMAL LOW (ref 6.5–8.1)

## 2020-07-23 LAB — BLOOD GAS, VENOUS
Acid-Base Excess: 7.6 mmol/L — ABNORMAL HIGH (ref 0.0–2.0)
Bicarbonate: 32.2 mmol/L — ABNORMAL HIGH (ref 20.0–28.0)
Drawn by: 1390
FIO2: 36
O2 Saturation: 95.3 %
Patient temperature: 37
pCO2, Ven: 51 mmHg (ref 44.0–60.0)
pH, Ven: 7.417 (ref 7.250–7.430)
pO2, Ven: 77.8 mmHg — ABNORMAL HIGH (ref 32.0–45.0)

## 2020-07-23 LAB — BRAIN NATRIURETIC PEPTIDE: B Natriuretic Peptide: 35.8 pg/mL (ref 0.0–100.0)

## 2020-07-23 MED ORDER — POTASSIUM CHLORIDE CRYS ER 20 MEQ PO TBCR
40.0000 meq | EXTENDED_RELEASE_TABLET | Freq: Two times a day (BID) | ORAL | Status: AC
  Administered 2020-07-23 (×2): 40 meq via ORAL
  Filled 2020-07-23 (×2): qty 2

## 2020-07-23 MED ORDER — POTASSIUM CHLORIDE 10 MEQ/100ML IV SOLN
10.0000 meq | INTRAVENOUS | Status: AC
  Administered 2020-07-23 (×4): 10 meq via INTRAVENOUS
  Filled 2020-07-23 (×4): qty 100

## 2020-07-23 MED ORDER — MAGNESIUM SULFATE 4 GM/100ML IV SOLN
4.0000 g | Freq: Once | INTRAVENOUS | Status: AC
  Administered 2020-07-23: 4 g via INTRAVENOUS
  Filled 2020-07-23: qty 100

## 2020-07-23 MED ORDER — MAGNESIUM SULFATE 2 GM/50ML IV SOLN
2.0000 g | Freq: Once | INTRAVENOUS | Status: AC
  Administered 2020-07-23: 2 g via INTRAVENOUS
  Filled 2020-07-23: qty 50

## 2020-07-23 NOTE — Evaluation (Signed)
Physical Therapy Evaluation Patient Details Name: Jesus Fuller MRN: [id number] DOB: [birth year] Today's Date: 07/23/2020   History of Present Illness  45yo male presenting after syncopal episode at home, hx of being diagnosed with post-tussive syncope. PMH ETOH abuse, CHF, HTN, hx DVT, NICM, tobacco abuse, loop recorder placement  Clinical Impression   Patient received in bed with nasal cannula off, very anxious and impulsive due to being worried that he may have had an incontinent BM in bed; refused to let me check orthostatics or O2 before rushing into the bathroom with IV pole and PT facilitating safe environment for mobility. Able to toilet and change shorts with distant S but took off telebox completely and kept pressing buttons on IV when it was beeping despite repeatedly being told by PT to not do so, RN made aware. Able to return to bed with distant S and SpO2 found to be 87% on room air, O2 replaced at 1LPM and he recovered to 94%, then nasal cannula was removed and he was able to maintain at 90-93% at rest. Performed orthostatics (see vitals section for details) but SpO2 found to be at 80% and he was placed back on 2LPM, able to recover to 88-89% and RN alerted. Left in bed with all needs met. Will follow to help facilitate improved mobility while in house.     Follow Up Recommendations No PT follow up    Equipment Recommendations  None recommended by PT    Recommendations for Other Services       Precautions / Restrictions Precautions Precautions: Fall;Other (comment) Precaution Comments: watch O2/BPs; impulsive/poor safety awareness Restrictions Weight Bearing Restrictions: No      Mobility  Bed Mobility Overal bed mobility: Independent             General bed mobility comments: increased time due to IV/line management    Transfers Overall transfer level: Needs assistance Equipment used: None Transfers: Sit to/from Stand Sit to Stand: Supervision          General transfer comment: S for safety with multiple lines  Ambulation/Gait Ambulation/Gait assistance: Supervision Gait Distance (Feet): 20 Feet (30f, 152f Assistive device: None Gait Pattern/deviations: Step-through pattern;WFL(Within Functional Limits)     General Gait Details: slower with multiple lines attached, no significatn balance or mobility deficits noted  Stairs            Wheelchair Mobility    Modified Rankin (Stroke Patients Only)       Balance Overall balance assessment: Independent;History of Falls                                           Pertinent Vitals/Pain Pain Assessment: Faces Faces Pain Scale: Hurts even more Pain Location: BLEs in standing Pain Descriptors / Indicators: Discomfort;Squeezing;Sharp Pain Intervention(s): Limited activity within patient's tolerance;Monitored during session    Home Living Family/patient expects to be discharged to:: Private residence Living Arrangements: Parent (staying with mom) Available Help at Discharge: Family Type of Home: House Home Access: Stairs to enter Entrance Stairs-Rails: Can reach both Entrance Stairs-Number of Steps: 5-6 Home Layout: Two level Home Equipment: None      Prior Function Level of Independence: Independent               Hand Dominance        Extremity/Trunk Assessment   Upper Extremity Assessment Upper Extremity Assessment: Overall WFL for  tasks assessed    Lower Extremity Assessment Lower Extremity Assessment: Overall WFL for tasks assessed    Cervical / Trunk Assessment Cervical / Trunk Assessment: Normal  Communication   Communication: No difficulties  Cognition Arousal/Alertness: Awake/alert Behavior During Therapy: Anxious;Impulsive Overall Cognitive Status: Within Functional Limits for tasks assessed                                 General Comments: very anxious and impulsive when I entered the room possibly due  to having had BM in bed; calmed down after going into the bathroom and getting cleaned up but still with very poor awareness of deficits/safety      General Comments      Exercises     Assessment/Plan    PT Assessment Patient needs continued PT services  PT Problem List Obesity;Decreased activity tolerance;Decreased safety awareness;Decreased mobility;Decreased coordination;Cardiopulmonary status limiting activity       PT Treatment Interventions DME instruction;Balance training;Gait training;Stair training;Cognitive remediation;Functional mobility training;Patient/family education;Therapeutic activities;Therapeutic exercise    PT Goals (Current goals can be found in the Care Plan section)  Acute Rehab PT Goals Patient Stated Goal: go home, feel better PT Goal Formulation: With patient Time For Goal Achievement: 08/06/20 Potential to Achieve Goals: Good    Frequency Min 2X/week   Barriers to discharge        Co-evaluation               AM-PAC PT "6 Clicks" Mobility  Outcome Measure Help needed turning from your back to your side while in a flat bed without using bedrails?: None Help needed moving from lying on your back to sitting on the side of a flat bed without using bedrails?: None Help needed moving to and from a bed to a chair (including a wheelchair)?: None Help needed standing up from a chair using your arms (e.g., wheelchair or bedside chair)?: None Help needed to walk in hospital room?: A Little Help needed climbing 3-5 steps with a railing? : A Little 6 Click Score: 22    End of Session   Activity Tolerance: Patient limited by fatigue Patient left: in bed;with call bell/phone within reach Nurse Communication: Mobility status;Other (comment) (behavior during session, orthostatics and SPO2 with mobiilty) PT Visit Diagnosis: History of falling (Z91.81);Other abnormalities of gait and mobility (R26.89)    Time: 4782-9562 PT Time Calculation (min)  (ACUTE ONLY): 54 min   Charges:   PT Evaluation $PT Eval Moderate Complexity: 1 Mod PT Treatments $Gait Training: 8-22 mins $Therapeutic Activity: 8-22 mins $Self Care/Home Management: 8-22        Windell Norfolk, DPT, PN1   Supplemental Physical Therapist [place]    Pager [phone] Acute Rehab Office [phone]
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
## 2020-07-23 NOTE — Progress Notes (Signed)
PROGRESS NOTE  Jesus Fuller QIW:[id number] DOB: 12-18-1975 DOA: 07/22/2020 PCP: Arvilla Market, DO  HPI/Recap of past 24 hours:   Jesus Fuller is a 45 y.o. male with medical history significant of asthma, chronic cough last 5-6 months, ongoing tobacco user, chronic systoic and diastolic CHF, NICM with improved EF on last echo in July, HTN, LLE dvt now offEliquis, came to [facility] ED after having a recurrent syncopal episode at home.  Patient has a history of an ICM with EF of 35% in 2020, repeat echo in July 2021 with EF of 60 to 65%.  Patient has an history of syncopal episodes most of the time associated with cough and diagnosed with posttussive syncope.  He wears a monitor.  Today while he was walking in kitchen he collapsed after coughing, having chest pain with fall and thought he fractured his ribs so came to ED for further evaluation.    ED Course: On arrival patient was afebrile, hypotensive with blood pressure of 70/56 and mildly hypoxic requiring 2 L of oxygen.  Labs positive for lactic acid of 4, troponin of 27, sodium of 128, chloride of 85, BUN 25, creatinine of 1.65 with baseline around 1, COVID-19 PCR negative, CTA was negative for PE or any other airspace opacity.  Cardiology was consulted from ED.  He received 1 dose of Zithromax and ceftriaxone along with 1 L bolus of normal saline.  07/23/20: Patient was seen and examined at his bedside.  He reports pain on his left side, he thinks he may have fractured some ribs.  Pain is worse with taking a deep breath.  Chest x-ray shows no evidence of fractured ribs, mass or pneumothorax.  Assessment/Plan: Active Problems:   Syncope  Recurrent Syncope, unclear etiology Patient with diagnosis of post tussive syncope, think that he is having more episodes as he is having more cough for the past 5 months. Cardiology was consulted and their advice admission for observation so they can interrogate his device Echocardiogram was  ordered by cardiology. Follow results of 2D echo Orthosthatic VS Qshift UA and UDS unremarkable  Lactic acidosis.   Patient appears little dry and he was hypoxic on arrival.  He was afebrile and no leukocytosis.   Received a dose of ceftriaxone and Zithromax in the ED, then was discontinued per admitting provider. Received IV fluid Lactic acid level down trending, repeat AM Procalcitonin 0.57, repeat AM  Elevated troponin.   No evidence of acute ischemia on twelve-lead EKG Troponin peaked at 27 History of nonischemic cardiomyopathy Follow results of 2D echo.  Nonischemic cardiomyopathy.   LV EF was improved on recent echo.  Was compliant with his diuretic and was taking both torsemide and Lasix??  2D echo is pending BNP normal  Hypovolemic hyponatremia Presented with serum sodium of 126  Serum sodium is uptrending to 131 Continue to monitor Repeat BMP in the morning  Hypokalemia Presented with serum potassium 2.8 Repleted intravenously and orally Repeat BMP in the morning  Hypomagnesemia Presented with magnesium level 1.1 Received 2 g of IV magnesium x1 Add 4 g of IV magnesium x1 dose.  Nicotine dependence.   Counseling was provided. -Nicotine patch.   DVT prophylaxis: Lovenox subcu daily Code Status: Full code Family Communication: Discussed with patient Disposition Plan: Home Consults called: Cardiology    Status is: Inpatient    Dispo:  Patient From: Home  Planned Disposition: Home  Expected discharge date: 07/24/2020  Medically stable for discharge: No, ongoing management of recurrent  syncope.         Objective: Vitals:   07/23/20 0334 07/23/20 0358 07/23/20 0627 07/23/20 1155  BP: 133/84 136/77 121/83 104/61  Pulse: (!) 136 (!) 102 (!) 107 90  Resp: (!) 24 20 20 19   Temp: 97.8 F (36.6 C) 98.3 F (36.8 C) 98.4 F (36.9 C) 97.9 F (36.6 C)  TempSrc: Oral Oral Oral Oral  SpO2: 97% 96% 97% 97%    Intake/Output Summary (Last 24  hours) at 07/23/2020 1319 Last data filed at 07/23/2020 0500 Gross per 24 hour  Intake 1590 ml  Output -  Net 1590 ml   There were no vitals filed for this visit.  Exam:  . General: 45 y.o. year-old male well developed well nourished in no acute distress.  Alert and oriented x3. . Cardiovascular: Regular rate and rhythm with no rubs or gallops.  No thyromegaly or JVD noted.   Marland Kitchen Respiratory: Clear to auscultation with no wheezes or rales. Good inspiratory effort. . Abdomen: Soft nontender nondistended with normal bowel sounds x4 quadrants. . Musculoskeletal: No lower extremity edema. 2/4 pulses in all 4 extremities. . Skin: No ulcerative lesions noted or rashes, . Psychiatry: Mood is appropriate for condition and setting   Data Reviewed: CBC: Recent Labs  Lab 07/22/20 1326 07/22/20 1611 07/22/20 2256  WBC 6.2  --  5.9  HGB 15.8 16.3 15.1  HCT 42.4 48.0 43.4  MCV 105.7*  --  108.2*  PLT 169  --  742*   Basic Metabolic Panel: Recent Labs  Lab 07/22/20 1326 07/22/20 1611 07/22/20 2256 07/23/20 0410  NA 128* 126*  --  131*  K 3.9 2.8*  --  2.8*  CL 85*  --   --  85*  CO2 25  --   --  28  GLUCOSE 124*  --   --  105*  BUN 25*  --   --  23*  CREATININE 1.65*  --  1.44* 1.40*  CALCIUM 8.3*  --   --  8.3*  MG  --   --   --  1.1*   GFR: CrCl cannot be calculated (Unknown ideal weight.). Liver Function Tests: Recent Labs  Lab 07/23/20 0410  AST 108*  ALT 71*  ALKPHOS 142*  BILITOT 2.3*  PROT 5.5*  ALBUMIN 2.6*   No results for input(s): LIPASE, AMYLASE in the last 168 hours. No results for input(s): AMMONIA in the last 168 hours. Coagulation Profile: No results for input(s): INR, PROTIME in the last 168 hours. Cardiac Enzymes: No results for input(s): CKTOTAL, CKMB, CKMBINDEX, TROPONINI in the last 168 hours. BNP (last 3 results) Recent Labs    05/25/20 1138  PROBNP 409*   HbA1C: No results for input(s): HGBA1C in the last 72 hours. CBG: No results for  input(s): GLUCAP in the last 168 hours. Lipid Profile: No results for input(s): CHOL, HDL, LDLCALC, TRIG, CHOLHDL, LDLDIRECT in the last 72 hours. Thyroid Function Tests: No results for input(s): TSH, T4TOTAL, FREET4, T3FREE, THYROIDAB in the last 72 hours. Anemia Panel: No results for input(s): VITAMINB12, FOLATE, FERRITIN, TIBC, IRON, RETICCTPCT in the last 72 hours. Urine analysis:    Component Value Date/Time   COLORURINE YELLOW 07/22/2020 1829   APPEARANCEUR CLEAR 07/22/2020 1829   LABSPEC 1.031 (H) 07/22/2020 1829   PHURINE 5.0 07/22/2020 1829   GLUCOSEU NEGATIVE 07/22/2020 1829   HGBUR NEGATIVE 07/22/2020 [place] 07/22/2020 [place] 07/22/2020 [place] NEGATIVE 07/22/2020 1829  NITRITE NEGATIVE 07/22/2020 1829   LEUKOCYTESUR NEGATIVE 07/22/2020 1829   Sepsis Labs: @LABRCNTIP (procalcitonin:4,lacticidven:4)  ) Recent Results (from the past 240 hour(s))  Resp Panel by RT-PCR (Flu A&B, Covid) Nasopharyngeal Swab     Status: None   Collection Time: 07/22/20  3:17 PM   Specimen: Nasopharyngeal Swab; Nasopharyngeal(NP) swabs in vial transport medium  Result Value Ref Range Status   SARS Coronavirus 2 by RT PCR NEGATIVE NEGATIVE Final    Comment: (NOTE) SARS-CoV-2 target nucleic acids are NOT DETECTED.  The SARS-CoV-2 RNA is generally detectable in upper respiratory specimens during the acute phase of infection. The lowest concentration of SARS-CoV-2 viral copies this assay can detect is 138 copies/mL. A negative result does not preclude SARS-Cov-2 infection and should not be used as the sole basis for treatment or other patient management decisions. A negative result may occur with  improper specimen collection/handling, submission of specimen other than nasopharyngeal swab, presence of viral mutation(s) within the areas targeted by this assay, and inadequate number of viral copies(<138 copies/mL). A negative result must be  combined with clinical observations, patient history, and epidemiological information. The expected result is Negative.  Fact Sheet for Patients:  09/19/20  Fact Sheet for Healthcare Providers:  BloggerCourse.com  This test is no t yet approved or cleared by the SeriousBroker.it FDA and  has been authorized for detection and/or diagnosis of SARS-CoV-2 by FDA under an Emergency Use Authorization (EUA). This EUA will remain  in effect (meaning this test can be used) for the duration of the COVID-19 declaration under Section 564(b)(1) of the Act, 21 U.S.C.section 360bbb-3(b)(1), unless the authorization is terminated  or revoked sooner.       Influenza A by PCR NEGATIVE NEGATIVE Final   Influenza B by PCR NEGATIVE NEGATIVE Final    Comment: (NOTE) The Xpert Xpress SARS-CoV-2/FLU/RSV plus assay is intended as an aid in the diagnosis of influenza from Nasopharyngeal swab specimens and should not be used as a sole basis for treatment. Nasal washings and aspirates are unacceptable for Xpert Xpress SARS-CoV-2/FLU/RSV testing.  Fact Sheet for Patients: [place]  Fact Sheet for Healthcare Providers: BloggerCourse.com  This test is not yet approved or cleared by the SeriousBroker.it FDA and has been authorized for detection and/or diagnosis of SARS-CoV-2 by FDA under an Emergency Use Authorization (EUA). This EUA will remain in effect (meaning this test can be used) for the duration of the COVID-19 declaration under Section 564(b)(1) of the Act, 21 U.S.C. section 360bbb-3(b)(1), unless the authorization is terminated or revoked.  Performed at [facility], [address] [place], [place]       Studies: DG Chest 2 View  Result Date: 07/22/2020 CLINICAL DATA:  Syncope, left-sided rib pain, weakness and nausea. EXAM: CHEST - 2 VIEW COMPARISON:  02/02/2020  and CT chest 11/10/2019. FINDINGS: Trachea is midline. Heart size normal. A loop recorder projects over the left hemithorax. Lungs are clear. No pleural fluid. Old right rib and right clavicle fractures. IMPRESSION: No acute findings. Electronically Signed   By: 11/12/2019 M.D.   On: 07/22/2020 13:56   CT Angio Chest PE W and/or Wo Contrast  Result Date: 07/22/2020 CLINICAL DATA:  Pain following fall EXAM: CT ANGIOGRAPHY CHEST WITH CONTRAST TECHNIQUE: Multidetector CT imaging of the chest was performed using the standard protocol during bolus administration of intravenous contrast. Multiplanar CT image reconstructions and MIPs were obtained to evaluate the vascular anatomy. CONTRAST:  9mL OMNIPAQUE IOHEXOL 350 MG/ML SOLN COMPARISON:  Chest radiograph July 22, 2020; chest CT angiogram November 10, 2019 FINDINGS: Cardiovascular: There is no demonstrable pulmonary embolus. There is no thoracic aortic aneurysm or dissection. Visualized great vessels appear normal. No pericardial effusion or pericardial thickening is evident. Mediastinum/Nodes: Visualized thyroid appears normal. There are scattered subcentimeter mediastinal lymph nodes. Previous prominent aortopulmonary window lymph node now has a short axis diameter of 5 mm, within normal limits. There is no frank adenopathy by size criteria. There is a small hiatal hernia with thickening in the wall of the distal third of the esophagus. Lungs/Pleura: No appreciable pneumothorax. Trachea and major bronchial structures appear patent. Lungs are clear. No evident pleural effusions. Upper Abdomen: There is hepatic steatosis. Visualized upper abdominal structures otherwise appear unremarkable. Musculoskeletal: Several healed rib fractures are noted on the right. No acute appearing fracture is demonstrable. There are no blastic or lytic bone lesions. Several small Schmorl's nodes noted. No evident chest wall lesions. Review of the MIP images confirms the above  findings. IMPRESSION: 1. No demonstrable pulmonary embolus. No thoracic aortic aneurysm or dissection. 2.  No edema or airspace opacity.  No pneumothorax. 3. Small hiatal hernia with distal esophageal wall thickening. Question chronic reflux esophagitis. 4.  No demonstrable adenopathy. 5.  Hepatic steatosis. Electronically Signed   By: Bretta Bang III M.D.   On: 07/22/2020 16:24   DG CHEST PORT 1 VIEW  Result Date: 07/22/2020 CLINICAL DATA:  Syncope EXAM: PORTABLE CHEST 1 VIEW COMPARISON:  07/22/2020 FINDINGS: The heart size and mediastinal contours are within normal limits. Implanted cardiac loop recorder again identified. Both lungs are clear. The visualized skeletal structures are unremarkable. IMPRESSION: No active disease. Electronically Signed   By: Helyn Numbers MD   On: 07/22/2020 23:03    Scheduled Meds: . carvedilol  12.5 mg Oral BID  . enoxaparin (LOVENOX) injection  40 mg Subcutaneous Q24H  . nicotine  14 mg Transdermal Daily  . potassium chloride  40 mEq Oral BID  . thiamine  100 mg Oral Daily    Continuous Infusions: . potassium chloride 10 mEq (07/23/20 1245)     LOS: 0 days     Darlin Drop, MD [facility] Pager [phone]  If 7PM-7AM, please contact night-coverage [email or web address] Password [email or web address] 07/23/2020, 1:19 PM

## 2020-07-23 NOTE — CV Procedure (Signed)
2D echo attempted, but patient coughing and has nausea. Will try echo later.

## 2020-07-24 ENCOUNTER — Inpatient Hospital Stay (HOSPITAL_COMMUNITY): Payer: Medicaid Other

## 2020-07-24 DIAGNOSIS — R054 Cough syncope: Secondary | ICD-10-CM

## 2020-07-24 DIAGNOSIS — J441 Chronic obstructive pulmonary disease with (acute) exacerbation: Principal | ICD-10-CM

## 2020-07-24 DIAGNOSIS — R55 Syncope and collapse: Secondary | ICD-10-CM

## 2020-07-24 LAB — BASIC METABOLIC PANEL
Anion gap: 14 (ref 5–15)
BUN: 17 mg/dL (ref 6–20)
CO2: 31 mmol/L (ref 22–32)
Calcium: 7.9 mg/dL — ABNORMAL LOW (ref 8.9–10.3)
Chloride: 87 mmol/L — ABNORMAL LOW (ref 98–111)
Creatinine, Ser: 1.21 mg/dL (ref 0.61–1.24)
GFR, Estimated: >60 mL/min (ref >60)
Glucose, Bld: 101 mg/dL — ABNORMAL HIGH (ref 70–99)
Potassium: 4 mmol/L (ref 3.5–5.1)
Sodium: 132 mmol/L — ABNORMAL LOW (ref 135–145)

## 2020-07-24 LAB — CBC WITH DIFFERENTIAL/PLATELET
Abs Immature Granulocytes: 0 10*3/uL (ref 0.00–0.07)
Basophils Absolute: 0.2 10*3/uL — ABNORMAL HIGH (ref 0.0–0.1)
Basophils Relative: 2 %
Eosinophils Absolute: 0 10*3/uL (ref 0.0–0.5)
Eosinophils Relative: 0 %
HCT: 41.8 % (ref 39.0–52.0)
Hemoglobin: 14.9 g/dL (ref 13.0–17.0)
Lymphocytes Relative: 4 %
Lymphs Abs: 0.4 10*3/uL — ABNORMAL LOW (ref 0.7–4.0)
MCH: 39.4 pg — ABNORMAL HIGH (ref 26.0–34.0)
MCHC: 35.6 g/dL (ref 30.0–36.0)
MCV: 110.6 fL — ABNORMAL HIGH (ref 80.0–100.0)
Monocytes Absolute: 0.3 10*3/uL (ref 0.1–1.0)
Monocytes Relative: 3 %
Neutro Abs: 9.3 10*3/uL — ABNORMAL HIGH (ref 1.7–7.7)
Neutrophils Relative %: 91 %
Platelets: 124 10*3/uL — ABNORMAL LOW (ref 150–400)
RBC: 3.78 MIL/uL — ABNORMAL LOW (ref 4.22–5.81)
RDW: 16.2 % — ABNORMAL HIGH (ref 11.5–15.5)
WBC: 10.2 10*3/uL (ref 4.0–10.5)
nRBC: 0 % (ref 0.0–0.2)
nRBC: 0 /100 WBC

## 2020-07-24 LAB — MAGNESIUM
Magnesium: 1.9 mg/dL (ref 1.7–2.4)
Magnesium: 1.7 mg/dL (ref 1.7–2.4)

## 2020-07-24 LAB — PHOSPHORUS: Phosphorus: <1 mg/dL — CL (ref 2.5–4.6)

## 2020-07-24 LAB — CBC
HCT: 42.2 % (ref 39.0–52.0)
Hemoglobin: 15.2 g/dL (ref 13.0–17.0)
MCH: 39.5 pg — ABNORMAL HIGH (ref 26.0–34.0)
MCHC: 36 g/dL (ref 30.0–36.0)
MCV: 109.6 fL — ABNORMAL HIGH (ref 80.0–100.0)
Platelets: 134 10*3/uL — ABNORMAL LOW (ref 150–400)
RBC: 3.85 MIL/uL — ABNORMAL LOW (ref 4.22–5.81)
RDW: 16.4 % — ABNORMAL HIGH (ref 11.5–15.5)
WBC: 11.5 10*3/uL — ABNORMAL HIGH (ref 4.0–10.5)
nRBC: 0 % (ref 0.0–0.2)

## 2020-07-24 LAB — ECHOCARDIOGRAM COMPLETE
Area-P 1/2: 3.77 cm2
S' Lateral: 3.1 cm

## 2020-07-24 LAB — PROCALCITONIN: Procalcitonin: 0.39 ng/mL

## 2020-07-24 MED ORDER — ADULT MULTIVITAMIN W/MINERALS CH
1.0000 | ORAL_TABLET | Freq: Every day | ORAL | Status: DC
  Administered 2020-07-24 – 2020-07-29 (×6): 1 via ORAL
  Filled 2020-07-24 (×6): qty 1

## 2020-07-24 MED ORDER — MONTELUKAST SODIUM 10 MG PO TABS: 10.0000 mg | ORAL_TABLET | Freq: Every day | ORAL | Status: DC

## 2020-07-24 MED ORDER — ALBUTEROL SULFATE (2.5 MG/3ML) 0.083% IN NEBU
2.5000 mg | INHALATION_SOLUTION | RESPIRATORY_TRACT | Status: DC | PRN
  Administered 2020-07-26: 2.5 mg via RESPIRATORY_TRACT
  Filled 2020-07-24: qty 3

## 2020-07-24 MED ORDER — POTASSIUM PHOSPHATES 15 MMOLE/5ML IV SOLN
30.0000 mmol | Freq: Once | INTRAVENOUS | Status: AC
  Administered 2020-07-24: 30 mmol via INTRAVENOUS
  Filled 2020-07-24: qty 10

## 2020-07-24 MED ORDER — PANTOPRAZOLE SODIUM 40 MG PO TBEC
40.0000 mg | DELAYED_RELEASE_TABLET | Freq: Every day | ORAL | Status: DC
  Administered 2020-07-25 – 2020-07-28 (×4): 40 mg via ORAL
  Filled 2020-07-24 (×4): qty 1

## 2020-07-24 MED ORDER — FLUTICASONE PROPIONATE 50 MCG/ACT NA SUSP
2.0000 | Freq: Two times a day (BID) | NASAL | Status: DC
  Filled 2020-07-24: qty 16

## 2020-07-24 MED ORDER — METHYLPREDNISOLONE SODIUM SUCC 40 MG IJ SOLR
40.0000 mg | Freq: Two times a day (BID) | INTRAMUSCULAR | Status: DC
  Administered 2020-07-24 – 2020-07-27 (×6): 40 mg via INTRAVENOUS
  Filled 2020-07-24 (×7): qty 1

## 2020-07-24 MED ORDER — FLUTICASONE PROPIONATE 50 MCG/ACT NA SUSP
1.0000 | Freq: Two times a day (BID) | NASAL | Status: DC
  Administered 2020-07-24 – 2020-07-29 (×9): 1 via NASAL
  Filled 2020-07-24: qty 16

## 2020-07-24 MED ORDER — LORAZEPAM 2 MG/ML IJ SOLN
1.0000 mg | INTRAMUSCULAR | Status: AC | PRN
Start: 2020-07-24 — End: 2020-07-27

## 2020-07-24 MED ORDER — ALUM & MAG HYDROXIDE-SIMETH 200-200-20 MG/5ML PO SUSP
30.0000 mL | Freq: Once | ORAL | Status: AC
  Administered 2020-07-24: 30 mL via ORAL
  Filled 2020-07-24: qty 30

## 2020-07-24 MED ORDER — THIAMINE HCL 100 MG PO TABS
100.0000 mg | ORAL_TABLET | Freq: Every day | ORAL | Status: DC
  Administered 2020-07-25 – 2020-07-29 (×5): 100 mg via ORAL
  Filled 2020-07-24 (×3): qty 1

## 2020-07-24 MED ORDER — FOLIC ACID 1 MG PO TABS
1.0000 mg | ORAL_TABLET | Freq: Every day | ORAL | Status: DC
  Administered 2020-07-24 – 2020-07-29 (×6): 1 mg via ORAL
  Filled 2020-07-24 (×6): qty 1

## 2020-07-24 MED ORDER — POTASSIUM & SODIUM PHOSPHATES 280-160-250 MG PO PACK
1.0000 | PACK | Freq: Three times a day (TID) | ORAL | Status: AC
  Administered 2020-07-24 – 2020-07-26 (×8): 1 via ORAL
  Filled 2020-07-24 (×8): qty 1

## 2020-07-24 MED ORDER — LORAZEPAM 1 MG PO TABS: 1.0000 mg | ORAL_TABLET | ORAL | Status: AC | PRN

## 2020-07-24 MED ORDER — FLUTICASONE FUROATE-VILANTEROL 100-25 MCG/INH IN AEPB
1.0000 | INHALATION_SPRAY | Freq: Every day | RESPIRATORY_TRACT | Status: DC
  Filled 2020-07-24: qty 28

## 2020-07-24 MED ORDER — GUAIFENESIN 100 MG/5ML PO SOLN
5.0000 mL | ORAL | Status: DC | PRN
  Administered 2020-07-24 – 2020-07-27 (×8): 100 mg via ORAL
  Filled 2020-07-24 (×5): qty 5
  Filled 2020-07-24: qty 10
  Filled 2020-07-24: qty 5

## 2020-07-24 MED ORDER — MOMETASONE FURO-FORMOTEROL FUM 100-5 MCG/ACT IN AERO
2.0000 | INHALATION_SPRAY | Freq: Two times a day (BID) | RESPIRATORY_TRACT | Status: DC
  Administered 2020-07-24 – 2020-07-29 (×9): 2 via RESPIRATORY_TRACT
  Filled 2020-07-24: qty 8.8

## 2020-07-24 MED ORDER — PERFLUTREN LIPID MICROSPHERE
1.0000 mL | INTRAVENOUS | Status: AC | PRN
  Administered 2020-07-24: 1 mL via INTRAVENOUS
  Filled 2020-07-24: qty 10

## 2020-07-24 MED ORDER — THIAMINE HCL 100 MG/ML IJ SOLN
100.0000 mg | Freq: Every day | INTRAMUSCULAR | Status: DC
  Filled 2020-07-24: qty 2

## 2020-07-24 NOTE — Progress Notes (Addendum)
PROGRESS NOTE  Jesus Fuller KZS:[id number] DOB: 1975-12-31 DOA: 07/22/2020 PCP: Nicolette Bang, DO  HPI/Recap of past 24 hours:   Jesus Fuller is a 45 y.o. male with medical history significant of asthma, chronic cough last 5-6 months, ongoing tobacco user, chronic systoic and diastolic CHF, NICM with improved EF on last echo in July, HTN, LLE dvt now offEliquis, came to [facility] ED after having a recurrent syncopal episode at home.  Patient has a history of an ICM with EF of 35% in 2020, repeat echo in July 2021 with EF of 60 to 65%.  Patient has an history of syncopal episodes most of the time associated with cough and diagnosed with posttussive syncope.  He wears a monitor.  Today while he was walking in kitchen he collapsed after coughing, having chest pain with fall and thought he fractured his ribs so came to ED for further evaluation.    ED Course: On arrival patient was afebrile, hypotensive with blood pressure of 70/56 and mildly hypoxic requiring 2 L of oxygen.  Labs positive for lactic acid of 4, troponin of 27, sodium of 128, chloride of 85, BUN 25, creatinine of 1.65 with baseline around 1, COVID-19 PCR negative, CTA was negative for PE or any other airspace opacity.  Cardiology was consulted from ED.  He received 1 dose of Zithromax and ceftriaxone along with 1 L bolus of normal saline.  07/24/20: Patient was seen and examined at his bedside.  Reports a chronic semiproductive cough.  He is concerned about recurrent cold syncope.  Pulmonary consulted to further assess.  Appreciate recommendations.  Assessment/Plan: Active Problems:   Syncope and collapse  Recurrent cough syncope Patient with diagnosis of post tussive syncope, think that he is having more episodes as he is having more cough for the past 5 months. Cardiology was consulted and they advised admission for observation so they can interrogate his device, no sinus pauses. Echocardiogram was ordered by  cardiology. Follow results of 2D echo Orthosthatic VS Qshift UA and UDS unremarkable  Upper airway cough syndrome Seen by pulmonary Recommended IV Solu-Medrol 40 mg twice daily until bronchospasm subsides Started on bronchodilator, Flonase and Protonix for GERD  Likely OSA Patient will need home sleep study as outpatient Will need to follow-up with pulmonary  GERD Started on PPI per pulmonology  Lactic acidosis.   Patient appears little dry and he was hypoxic on arrival.  He was afebrile and no leukocytosis.   Received a dose of ceftriaxone and Zithromax in the ED, then was discontinued per admitting provider. Received IV fluid Lactic acid level down trending, repeat AM Procalcitonin 0.57, repeat AM 0.39  Elevated troponin.   No evidence of acute ischemia on twelve-lead EKG Troponin peaked at 27 History of nonischemic cardiomyopathy Follow results of 2D echo.  Nonischemic cardiomyopathy.   LV EF was improved on recent echo.  Was compliant with his diuretic and was taking both torsemide and Lasix??  2D echo completed on 07/24/2020, results are pending. BNP normal  Improving hypervolemic hyponatremia Presented with serum sodium of 126  Serum sodium is uptrending to 131>132. Continue to monitor Repeat BMP in the morning  Resolved post repletion: Hypokalemia Presented with serum potassium 2.8>> 4.0. Repleted intravenously and orally  Resolved post repletion: Hypomagnesemia Presented with magnesium level 1.1>> 1.9. Repleted intravenously.  Nicotine dependence.   Counseling was provided. -Nicotine patch.  Alcohol use disorder with concern for withdrawal Drinks 1 pint of liquor every other day Start CIWA protocol  DVT prophylaxis: Lovenox subcu daily Code Status: Full code Family Communication: Discussed with patient Disposition Plan: Home Consults called: Cardiology, pulmonary.    Status is: Inpatient    Dispo:  Patient From: Home  Planned  Disposition: Home  Expected discharge date: 07/25/20  Medically stable for discharge: No, ongoing management of recurrent syncope.         Objective: Vitals:   07/23/20 1652 07/24/20 0058 07/24/20 0531 07/24/20 1217  BP: 133/84 118/74 (P) 105/70 110/72  Pulse: 97 89 (P) 91 89  Resp: 18 18 (P) 18 18  Temp: 97.7 F (36.5 C) 98.6 F (37 C) (P) 98.3 F (36.8 C) 98.5 F (36.9 C)  TempSrc: Oral Oral (P) Oral Oral  SpO2: 97% 96% (P) 98% 98%    Intake/Output Summary (Last 24 hours) at 07/24/2020 1552 Last data filed at 07/24/2020 0101 Gross per 24 hour  Intake 624.5 ml  Output 700 ml  Net -75.5 ml   There were no vitals filed for this visit.  Exam:  . General: 45 y.o. year-old male obese in no acute stress.  Alert oriented x3.   . Cardiovascular: Regular rate and rhythm no rubs or gallops. Marland Kitchen Respiratory: Mild rales at bases, mild diffuse wheezing noted bilaterally.  Good respiratory effort. . Abdomen: Soft obese nontender normal bowel sounds present . Musculoskeletal: 1+ pitting edema in lower extremities bilaterally.   . Skin: No ulcerative lesions noted or rashes . Psychiatry: Mood is appropriate for condition and setting.   Data Reviewed: CBC: Recent Labs  Lab 07/22/20 1326 07/22/20 1611 07/22/20 2256 07/24/20 0609 07/24/20 0929  WBC 6.2  --  5.9 11.5* 10.2  NEUTROABS  --   --   --   --  9.3*  HGB 15.8 16.3 15.1 15.2 14.9  HCT 42.4 48.0 43.4 42.2 41.8  MCV 105.7*  --  108.2* 109.6* 110.6*  PLT 169  --  147* 134* 124*   Basic Metabolic Panel: Recent Labs  Lab 07/22/20 1326 07/22/20 1611 07/22/20 2256 07/23/20 0410 07/24/20 0543  NA 128* 126*  --  131* 132*  K 3.9 2.8*  --  2.8* 4.0  CL 85*  --   --  85* 87*  CO2 25  --   --  28 31  GLUCOSE 124*  --   --  105* 101*  BUN 25*  --   --  23* 17  CREATININE 1.65*  --  1.44* 1.40* 1.21  CALCIUM 8.3*  --   --  8.3* 7.9*  MG  --   --   --  1.1* 1.9   GFR: CrCl cannot be calculated (Unknown ideal  weight.). Liver Function Tests: Recent Labs  Lab 07/23/20 0410  AST 108*  ALT 71*  ALKPHOS 142*  BILITOT 2.3*  PROT 5.5*  ALBUMIN 2.6*   No results for input(s): LIPASE, AMYLASE in the last 168 hours. No results for input(s): AMMONIA in the last 168 hours. Coagulation Profile: No results for input(s): INR, PROTIME in the last 168 hours. Cardiac Enzymes: No results for input(s): CKTOTAL, CKMB, CKMBINDEX, TROPONINI in the last 168 hours. BNP (last 3 results) Recent Labs    05/25/20 1138  PROBNP 409*   HbA1C: No results for input(s): HGBA1C in the last 72 hours. CBG: No results for input(s): GLUCAP in the last 168 hours. Lipid Profile: No results for input(s): CHOL, HDL, LDLCALC, TRIG, CHOLHDL, LDLDIRECT in the last 72 hours. Thyroid Function Tests: No results for input(s): TSH, T4TOTAL, FREET4, T3FREE, THYROIDAB  in the last 72 hours. Anemia Panel: No results for input(s): VITAMINB12, FOLATE, FERRITIN, TIBC, IRON, RETICCTPCT in the last 72 hours. Urine analysis:    Component Value Date/Time   COLORURINE YELLOW 07/22/2020 1829   APPEARANCEUR CLEAR 07/22/2020 1829   LABSPEC 1.031 (H) 07/22/2020 1829   PHURINE 5.0 07/22/2020 1829   GLUCOSEU NEGATIVE 07/22/2020 1829   HGBUR NEGATIVE 07/22/2020 1829   BILIRUBINUR NEGATIVE 07/22/2020 1829   KETONESUR NEGATIVE 07/22/2020 1829   PROTEINUR NEGATIVE 07/22/2020 1829   NITRITE NEGATIVE 07/22/2020 1829   LEUKOCYTESUR NEGATIVE 07/22/2020 1829   Sepsis Labs: @LABRCNTIP (procalcitonin:4,lacticidven:4)  ) Recent Results (from the past 240 hour(s))  Resp Panel by RT-PCR (Flu A&B, Covid) Nasopharyngeal Swab     Status: None   Collection Time: 07/22/20  3:17 PM   Specimen: Nasopharyngeal Swab; Nasopharyngeal(NP) swabs in vial transport medium  Result Value Ref Range Status   SARS Coronavirus 2 by RT PCR NEGATIVE NEGATIVE Final    Comment: (NOTE) SARS-CoV-2 target nucleic acids are NOT DETECTED.  The SARS-CoV-2 RNA is generally  detectable in upper respiratory specimens during the acute phase of infection. The lowest concentration of SARS-CoV-2 viral copies this assay can detect is 138 copies/mL. A negative result does not preclude SARS-Cov-2 infection and should not be used as the sole basis for treatment or other patient management decisions. A negative result may occur with  improper specimen collection/handling, submission of specimen other than nasopharyngeal swab, presence of viral mutation(s) within the areas targeted by this assay, and inadequate number of viral copies(<138 copies/mL). A negative result must be combined with clinical observations, patient history, and epidemiological information. The expected result is Negative.  Fact Sheet for Patients:  09/19/20  Fact Sheet for Healthcare Providers:  BloggerCourse.com  This test is no t yet approved or cleared by the [employer] FDA and  has been authorized for detection and/or diagnosis of SARS-CoV-2 by FDA under an Emergency Use Authorization (EUA). This EUA will remain  in effect (meaning this test can be used) for the duration of the COVID-19 declaration under Section 564(b)(1) of the Act, 21 U.S.C.section 360bbb-3(b)(1), unless the authorization is terminated  or revoked sooner.       Influenza A by PCR NEGATIVE NEGATIVE Final   Influenza B by PCR NEGATIVE NEGATIVE Final    Comment: (NOTE) The Xpert Xpress SARS-CoV-2/FLU/RSV plus assay is intended as an aid in the diagnosis of influenza from Nasopharyngeal swab specimens and should not be used as a sole basis for treatment. Nasal washings and aspirates are unacceptable for Xpert Xpress SARS-CoV-2/FLU/RSV testing.  Fact Sheet for Patients: [place]  Fact Sheet for Healthcare Providers: BloggerCourse.com  This test is not yet approved or cleared by the [employer] FDA  and has been authorized for detection and/or diagnosis of SARS-CoV-2 by FDA under an Emergency Use Authorization (EUA). This EUA will remain in effect (meaning this test can be used) for the duration of the COVID-19 declaration under Section 564(b)(1) of the Act, 21 U.S.C. section 360bbb-3(b)(1), unless the authorization is terminated or revoked.  Performed at [facility], [address], [place], [place]       Studies: No results found.  Scheduled Meds: . alum & mag hydroxide-simeth  30 mL Oral Once  . carvedilol  12.5 mg Oral BID  . enoxaparin (LOVENOX) injection  40 mg Subcutaneous Q24H  . fluticasone  1 spray Each Nare BID  . folic acid  1 mg Oral Daily  . methylPREDNISolone (SOLU-MEDROL)  injection  40 mg Intravenous Q12H  . mometasone-formoterol  2 puff Inhalation BID  . multivitamin with minerals  1 tablet Oral Daily  . nicotine  14 mg Transdermal Daily  . [START ON 07/25/2020] pantoprazole  40 mg Oral Q1200  . thiamine  100 mg Oral Daily   Or  . thiamine  100 mg Intravenous Daily  . thiamine  100 mg Oral Daily    Continuous Infusions:    LOS: 1 day     Darlin Drop, MD [facility] Pager [phone]  If 7PM-7AM, please contact night-coverage [email or web address] Password [email or web address] 07/24/2020, 3:52 PM

## 2020-07-24 NOTE — Consult Note (Signed)
NAME:  Jesus Fuller, MRN:  [id number], DOB:  May 18, 1976, LOS: 1 ADMISSION DATE:  07/22/2020, CONSULTATION DATE:  07/24/20 REFERRING MD:  [place] Crane - [facility], CHIEF COMPLAINT:  Cough with syncope  Brief History:  45 yo M with syncope associated with cough. No identifiable cardiac etiology, pulm consult for management of asthma   History of Present Illness:  45 yo M PMH Asthma, chronic cough, tobacco use disorder, systolic and diastolic heart failure, NICM, prior DVT, who presented to ED 1/5 following syncopal episode in which the patient fell. Syncopal episode associated with coughing, which has been ongoing problem for patient. Cardiology was consulted for evaluation of possible cardiac etiology of possttussive syncope, including device interrogation of implanted loop recorder, but have not been revealing.  Noted by EP that patient reports frequent rescue inhaler use, and is wheezing on exam.   Pulm consult for asthma management.  Patient reports a few coughing + syncopal episodes "growing up," but starting occurring regularly approx 1 year ago, including a syncopal episode that occurred while driving. Approx 6 months ago, these events increased in frequency again following a viral-type URI sx. Reports worsening heartburn-like symptoms and inability to afford OTC medications other than Tums for this. Coughing is worse at night when pt is flat -- sometimes pt feels post-nasal drip and often notices reflux. Endorses intermittent seasonal allergies but does not take medication for allergies. Is prescribed albuterol by Cardiology. Endorses ongoing tobacco use -- started smoking at age 41. 3 months ago, has cut back from 2.5 ppd to 1 ppd and is trying to quit.   Past Medical History:  Systolic and diastolic HF HTN LLE DVT NICM Tobacco abuse Asthma  Hepatic steatosis  Significant Hospital Events:   1/5 admitted to [facility]. Cardiology consulted  1/6 Cards EP consulted.  1/7 PCCM consulted for asthma   Consults:  Cards Cards EP PCCM  Procedures:    Significant Diagnostic Tests:  CTA chest> no PE. No pulm edema or ASD. Distal esophageal wall thickening. Small hiatal hernia. Hepatic steatosis  Micro Data:  1/5 COVID negative   Antimicrobials:    Interim History / Subjective:  Says he just coughed a few minutes ago and feels wheezy after  I inquired about seasonal allergies and reflux-- both of which the patient feels he experiences. Says his reflux feels "extra bad" the past several days. Objective   Blood pressure 110/72, pulse 89, temperature 98.5 F (36.9 C), temperature source Oral, resp. rate 18, SpO2 98 %.        Intake/Output Summary (Last 24 hours) at 07/24/2020 1343 Last data filed at 07/24/2020 0101 Gross per 24 hour  Intake 624.5 ml  Output 1150 ml  Net -525.5 ml   There were no vitals filed for this visit.  Examination: General: Chronically ill obese middle aged M, reclined in bed NAD  HENT: NCAT anicteric sclera Lungs: symmetrical chest expansion. Bilateral wheezing  Cardiovascular: rrr s1s2  Abdomen: Soft round ndnt Extremities: BLE edema  Neuro: AAOx4 no focal deficits  GU: defer  Resolved Hospital Problem list     Assessment & Plan:     Chronic cough-- possibly multifactorial: is wheezing, has reflux, intermittent allergies / post nasal drip. Symptoms are severe with associated posttussive syncope, will broadly address multiple possible etiologies at this time -reported childhood asthma (possible severe-persistent asthma?)  -long-time smoker -- possible COPD -GERD (heartburn + reflux + esophageal wall thickening on CT)  -post nasal drip/ allergies P - Solumedrol 40mg   - Dulera -  PRN albuterol -Flonase  -Protonix  -needs OP pulm follow up for ongoing mgmnt + needs sleep study for suspected OSA, needs PFTs     Best practice (evaluated daily)  Diet: reg Pain/Anxiety/Delirium protocol (if indicated): na VAP protocol (if indicated):  na DVT prophylaxis: lovenox  GI prophylaxis: PPI Glucose control: monitor Mobility: PT eval Disposition:cardiac tele  Goals of Care:  Last date of multidisciplinary goals of care discussion: Family and staff present:  Summary of discussion:  Follow up goals of care discussion due:  Code Status: Full  Labs   CBC: Recent Labs  Lab 07/22/20 1326 07/22/20 1611 07/22/20 2256 07/24/20 0609  WBC 6.2  --  5.9 11.5*  HGB 15.8 16.3 15.1 15.2  HCT 42.4 48.0 43.4 42.2  MCV 105.7*  --  108.2* 109.6*  PLT 169  --  147* 134*    Basic Metabolic Panel: Recent Labs  Lab 07/22/20 1326 07/22/20 1611 07/22/20 2256 07/23/20 0410 07/24/20 0543  NA 128* 126*  --  131* 132*  K 3.9 2.8*  --  2.8* 4.0  CL 85*  --   --  85* 87*  CO2 25  --   --  28 31  GLUCOSE 124*  --   --  105* 101*  BUN 25*  --   --  23* 17  CREATININE 1.65*  --  1.44* 1.40* 1.21  CALCIUM 8.3*  --   --  8.3* 7.9*  MG  --   --   --  1.1* 1.9   GFR: CrCl cannot be calculated (Unknown ideal weight.). Recent Labs  Lab 07/22/20 1326 07/22/20 1556 07/22/20 1801 07/22/20 2256 07/24/20 0609 07/24/20 0929  PROCALCITON 0.57  --   --   --   --  0.39  WBC 6.2  --   --  5.9 11.5*  --   LATICACIDVEN  --  4.0* 2.8*  --   --   --     Liver Function Tests: Recent Labs  Lab 07/23/20 0410  AST 108*  ALT 71*  ALKPHOS 142*  BILITOT 2.3*  PROT 5.5*  ALBUMIN 2.6*   No results for input(s): LIPASE, AMYLASE in the last 168 hours. No results for input(s): AMMONIA in the last 168 hours.  ABG    Component Value Date/Time   PHART 7.445 10/24/2018 1534   PCO2ART 39.3 10/24/2018 1534   PO2ART 59.0 (L) 10/24/2018 1534   HCO3 32.2 (H) 07/23/2020 0410   TCO2 42 (H) 07/22/2020 1611   O2SAT 95.3 07/23/2020 0410     Coagulation Profile: No results for input(s): INR, PROTIME in the last 168 hours.  Cardiac Enzymes: No results for input(s): CKTOTAL, CKMB, CKMBINDEX, TROPONINI in the last 168 hours.  HbA1C: No results  found for: HGBA1C  CBG: No results for input(s): GLUCAP in the last 168 hours.  Review of Systems:   As per HPI   Past Medical History:  He,  has a past medical history of Alcoholism /alcohol abuse, Aortic atherosclerosis (HCC), Asthma, Chest pain with normal coronary angiography (10/2018), Chronic combined systolic and diastolic CHF (congestive heart failure) (HCC), Fatty liver, Hypertension, Left leg DVT (HCC) (09/2019), NICM (nonischemic cardiomyopathy) (HCC), and Tobacco abuse.   Surgical History:   Past Surgical History:  Procedure Laterality Date  . implantable loop recorder placement  05/25/2020   Medtronic Reveal Cliff model [id number] (SN [id number]) implanted by Dr Johney Frame for Alleviate HF study  . RIGHT/LEFT HEART CATH AND CORONARY ANGIOGRAPHY N/A 10/24/2018   Procedure: RIGHT/LEFT  HEART CATH AND CORONARY ANGIOGRAPHY;  Surgeon: Orpah Cobb, MD;  Location: [facility] INVASIVE [facility];  Service: Cardiovascular;  Laterality: N/A;     Social History:   reports that he has been smoking cigarettes. He has been smoking about 1.50 packs per day. He has never used smokeless tobacco. He reports current alcohol use. He reports that he does not use drugs.   Family History:  His family history includes COPD in his mother; Cancer in his mother; Diabetes in his father.   Allergies No Known Allergies   Home Medications  Prior to Admission medications   Medication Sig Start Date End Date Taking? Authorizing Provider  albuterol (PROVENTIL) (2.5 MG/3ML) 0.083% nebulizer solution TAKE 3 MLS (2.5 MG TOTAL) BY NEBULIZATION EVERY 2 (TWO) HOURS AS NEEDED FOR WHEEZING. Patient taking differently: Take 3 mLs by nebulization every 2 (two) hours as needed for wheezing. 06/18/20  Yes Jodelle Red, MD  albuterol (VENTOLIN HFA) 108 (90 Base) MCG/ACT inhaler Inhale 2 puffs into the lungs every 6 (six) hours as needed for wheezing or shortness of breath. 06/15/20  Yes Arvilla Market, DO  aspirin EC  325 MG tablet Take 325 mg by mouth daily as needed for moderate pain.   Yes [provider]  carvedilol (COREG) 12.5 MG tablet Take 1 tablet (12.5 mg total) by mouth 2 (two) times daily. 06/10/20 09/08/20 Yes Jodelle Red, MD  furosemide (LASIX) 40 MG tablet As needed patient may take 40 MG Lasix PRN by mouth daily as directed  per Alleviate Research HF Study PRN plan. Patient taking differently: Take 40 mg by mouth daily as needed for fluid or edema. As needed patient may take 40 MG Lasix PRN by mouth daily as directed  per Alleviate Research HF Study PRN plan. 05/25/20 05/25/21 Yes Allred, Fayrene Fearing, MD  potassium chloride SA (KLOR-CON M20) 20 MEQ tablet As needed patient may take 20 MEQ Potassium PRN by mouth daily as directed  per Alleviate Research HF Study PRN plan. Patient taking differently: Take 20 mEq by mouth daily as needed (with furosemide). As needed patient may take 20 MEQ Potassium PRN by mouth daily as directed  per Alleviate Research HF Study PRN plan. 05/25/20 05/25/21 Yes Allred, Fayrene Fearing, MD  torsemide (DEMADEX) 20 MG tablet Take 1 tablet (20 mg total) by mouth daily. 06/10/20 06/05/21 Yes Jodelle Red, MD  atorvastatin (LIPITOR) 40 MG tablet Take 1 tablet (40 mg total) by mouth daily at 6 PM. Patient not taking: Reported on 03/27/2019 10/25/18 03/27/19  Orpah Cobb, MD     Tessie Fass MSN, AGACNP-BC [facility] [phone] If no answer, [phone] 07/24/2020, 3:16 PM

## 2020-07-24 NOTE — TOC Initial Note (Addendum)
Transition of Care [facility]) - Initial/Assessment Note    Patient Details  Name: Jesus Fuller MRN: [id number] Date of Birth: 1976-04-01  [facility] CM/SW Contact:    Kingsley Plan, RN Phone Number: 07/24/2020, 1:01 PM  Clinical Narrative:                 Patient from home alone. However his mother lives next door and assist him and his children. Confirmed face sheet information.   Patient has NEB machine at home, no other DME.   Patient currently on oxygen does not have oxygen at home.   Patient has family planning Medicaid, has been declined [place] Medicaid. He has applied for disability.     Patient CHF MD is Dr Rayna Sexton and PCP is DR Marcy Siren.  He uses [facility] to get medications filled.    Patient has follow up appointment with Dr Rayna Sexton on 07/27/20 at 3:40 pm placed on AVS.   Will schedule follow up with Dr Earlene Plater ( office currently closed for lunch).   Scheduled follow up appointment with Dr Sheran Fava  August 05, 2020 at 2:30 pm . He has cancelled his last three appointments with Dr Earlene Plater per receptionist.  Expected Discharge Plan: Home/Self Care Barriers to Discharge: Continued Medical Work up   Patient Goals and CMS Choice Patient states their goals for this hospitalization and ongoing recovery are:: to return to home CMS Medicare.gov Compare Post Acute Care list provided to:: Patient    Expected Discharge Plan and Services Expected Discharge Plan: Home/Self Care   Discharge Planning Services: CM Consult   Living arrangements for the past 2 months: Apartment                 DME Arranged: N/A DME Agency: NA       HH Arranged: NA          Prior Living Arrangements/Services Living arrangements for the past 2 months: Apartment Lives with:: Self Patient language and need for interpreter reviewed:: Yes Do you feel safe going back to the place where you live?: Yes      Need  for Family Participation in Patient Care: Yes (Comment) Care giver support system in place?: Yes (comment) Current home services: DME Criminal Activity/Legal Involvement Pertinent to Current Situation/Hospitalization: No - Comment as needed  Activities of Daily Living Home Assistive Devices/Equipment: None ADL Screening (condition at time of admission) Patient's cognitive ability adequate to safely complete daily activities?: Yes Is the patient deaf or have difficulty hearing?: No Does the patient have difficulty seeing, even when wearing glasses/contacts?: No Does the patient have difficulty concentrating, remembering, or making decisions?: No Patient able to express need for assistance with ADLs?: Yes Does the patient have difficulty dressing or bathing?: Yes Independently performs ADLs?: Yes (appropriate for developmental age) Communication: Independent Dressing (OT): Independent Grooming: Independent Feeding: Independent Bathing: Independent Toileting: Independent In/Out Bed: Independent Walks in Home: Independent Does the patient have difficulty walking or climbing stairs?: Yes Weakness of Legs: None Weakness of Arms/Hands: None  Permission Sought/Granted   Permission granted to share information with : No              Emotional Assessment Appearance:: Appears stated age Attitude/Demeanor/Rapport: Engaged Affect (typically observed): Accepting Orientation: : Oriented to Self,Oriented to Place,Oriented to  Time,Oriented to Situation Alcohol / Substance Use: Not Applicable Psych Involvement: No (comment)  Admission diagnosis:  Syncope and collapse [R55] Syncope [R55] Troponin level elevated [R77.8]  Respiratory failure with hypoxia [facility]) [J96.91] Patient Active Problem List   Diagnosis Date Noted  . Acute bronchitis 02/02/2020  . Syncope and collapse 02/02/2020  . 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 Chronic  combined systolic and diastolic heart failure (HCC) 12/05/2019  . Essential hypertension 12/02/2019  . Tobacco abuse 11/10/2019   PCP:  Arvilla Market, DO Pharmacy:   [facility] - [place], [place] [address] [place] [address] Phone: [phone] Fax: [phone]     Social Determinants of Health (SDOH) Interventions    Readmission Risk Interventions No flowsheet data found.

## 2020-07-24 NOTE — Progress Notes (Signed)
*  PRELIMINARY RESULTS* Echocardiogram 2D Echocardiogram with definity has been performed.  Jesus Fuller 07/24/2020, 12:17 PM

## 2020-07-24 NOTE — Progress Notes (Signed)
CRITICAL VALUE STICKER  CRITICAL VALUE: Phosphorus less than 1.0   DATE & TIME NOTIFIED: 4:45 PM   MD NOTIFIED: Dow Adolph MD  TIME OF NOTIFICATION: 4:45 PM   RESPONSE:

## 2020-07-25 DIAGNOSIS — R778 Other specified abnormalities of plasma proteins: Secondary | ICD-10-CM

## 2020-07-25 DIAGNOSIS — R55 Syncope and collapse: Secondary | ICD-10-CM

## 2020-07-25 DIAGNOSIS — R054 Cough syncope: Secondary | ICD-10-CM

## 2020-07-25 DIAGNOSIS — J441 Chronic obstructive pulmonary disease with (acute) exacerbation: Secondary | ICD-10-CM

## 2020-07-25 LAB — BASIC METABOLIC PANEL
Anion gap: 14 (ref 5–15)
BUN: 14 mg/dL (ref 6–20)
CO2: 29 mmol/L (ref 22–32)
Calcium: 7.7 mg/dL — ABNORMAL LOW (ref 8.9–10.3)
Chloride: 90 mmol/L — ABNORMAL LOW (ref 98–111)
Creatinine, Ser: 1.16 mg/dL (ref 0.61–1.24)
GFR, Estimated: >60 mL/min (ref >60)
Glucose, Bld: 129 mg/dL — ABNORMAL HIGH (ref 70–99)
Potassium: 3.7 mmol/L (ref 3.5–5.1)
Sodium: 133 mmol/L — ABNORMAL LOW (ref 135–145)

## 2020-07-25 LAB — PHOSPHORUS: Phosphorus: 2.4 mg/dL — ABNORMAL LOW (ref 2.5–4.6)

## 2020-07-25 MED ORDER — CALCIUM GLUCONATE 500 MG PO TABS
1.0000 | ORAL_TABLET | Freq: Three times a day (TID) | ORAL | Status: DC
  Filled 2020-07-25 (×2): qty 1

## 2020-07-25 MED ORDER — ALUM & MAG HYDROXIDE-SIMETH 200-200-20 MG/5ML PO SUSP
30.0000 mL | Freq: Four times a day (QID) | ORAL | Status: DC | PRN
  Administered 2020-07-25 – 2020-07-28 (×8): 30 mL via ORAL
  Filled 2020-07-25 (×9): qty 30

## 2020-07-25 MED ORDER — MAGNESIUM OXIDE 400 (241.3 MG) MG PO TABS
400.0000 mg | ORAL_TABLET | Freq: Two times a day (BID) | ORAL | Status: AC
  Administered 2020-07-25 – 2020-07-26 (×4): 400 mg via ORAL
  Filled 2020-07-25 (×4): qty 1

## 2020-07-25 MED ORDER — GABAPENTIN 100 MG PO CAPS
200.0000 mg | ORAL_CAPSULE | Freq: Three times a day (TID) | ORAL | Status: DC
  Administered 2020-07-25 – 2020-07-29 (×13): 200 mg via ORAL
  Filled 2020-07-25 (×13): qty 2

## 2020-07-25 MED ORDER — LIP MEDEX EX OINT
TOPICAL_OINTMENT | CUTANEOUS | Status: DC | PRN
  Filled 2020-07-25: qty 7

## 2020-07-25 MED ORDER — FUROSEMIDE 10 MG/ML IJ SOLN
40.0000 mg | Freq: Two times a day (BID) | INTRAMUSCULAR | Status: DC
  Administered 2020-07-25 – 2020-07-27 (×4): 40 mg via INTRAVENOUS
  Filled 2020-07-25 (×4): qty 4

## 2020-07-25 MED ORDER — CALCIUM CITRATE 950 (200 CA) MG PO TABS
200.0000 mg | ORAL_TABLET | Freq: Three times a day (TID) | ORAL | Status: AC
  Administered 2020-07-25 – 2020-07-26 (×6): 200 mg via ORAL
  Filled 2020-07-25 (×6): qty 1

## 2020-07-25 MED ORDER — FUROSEMIDE 10 MG/ML IJ SOLN
40.0000 mg | Freq: Once | INTRAMUSCULAR | Status: AC
  Administered 2020-07-25: 40 mg via INTRAVENOUS
  Filled 2020-07-25: qty 4

## 2020-07-25 NOTE — Progress Notes (Signed)
PROGRESS NOTE  Jesus Fuller:[id number] DOB: 07-Jan-1976 DOA: 07/22/2020 PCP: Arvilla Market, DO  HPI/Recap of past 24 hours:   Jesus Fuller is a 45 y.o. male with medical history significant of asthma, chronic cough last 5-6 months, ongoing tobacco user, chronic systoic and diastolic CHF, NICM with improved EF on last echo in July, HTN, LLE dvt now offEliquis, came to [facility] ED after having a recurrent syncopal episode at home.  Patient has a history of an ICM with EF of 35% in 2020, repeat echo in July 2021 with EF of 60 to 65%.  Patient has an history of syncopal episodes most of the time associated with cough and diagnosed with posttussive syncope.  He wears a monitor.  Today while he was walking in kitchen he collapsed after coughing, having chest pain with fall and thought he fractured his ribs so came to ED for further evaluation.    ED Course: On arrival patient was afebrile, hypotensive with blood pressure of 70/56 and mildly hypoxic requiring 2 L of oxygen.  Labs positive for lactic acid of 4, troponin of 27, sodium of 128, chloride of 85, BUN 25, creatinine of 1.65 with baseline around 1, COVID-19 PCR negative, CTA was negative for PE or any other airspace opacity.  Cardiology was consulted from ED.  He received 1 dose of Zithromax and ceftriaxone along with 1 L bolus of normal saline.  Hospital course complicated by persistent cough for which pulmonary was consulted.  Was started on IV Solu-Medrol 40 mg twice daily and bronchodilators with improvement of symptomatology.  07/25/20: He feels better this morning however has persistent wheezing on exam.  Volume overload, started IV Lasix.  Seen by cardiology, signed off, he has a follow-up appointment on Monday, 07/27/2020.  Will notify cardiology if he is still in the hospital to arrange follow-up.  Assessment/Plan: Active Problems:   Syncope and collapse   Troponin level elevated  Recurrent cough syncope Patient with  diagnosis of post tussive syncope, think that he is having more episodes as he is having more cough for the past 5 months. Cardiology was consulted and they advised admission for observation so they can interrogate his device, no sinus pauses. Echocardiogram revealed improved LVEF 60 to 65% from 30 to 35% previously. UA and UDS unremarkable Seen by cardiology, signed off.  No changes in his home cardiac medications.  He is scheduled for a follow-up on 07/27/2020.  Upper airway cough syndrome Seen by pulmonary Recommended IV Solu-Medrol 40 mg twice daily until bronchospasm subsides, continue Started on bronchodilator, Flonase and Protonix for GERD, continue  Hypocalcemia Repleted orally  Severe hypomagnesemia Repleted intravenously  Severe hypophosphatemia Repleted Recheck  Chronic combined systolic and diastolic CHF Prior to admission he was on torsemide, held Start IV Lasix 40 mg twice daily Closely monitor blood pressure and electrolytes while on IV diuretics Strict I's and O's and daily weight  Likely OSA Patient will need home sleep study as outpatient Will need to follow-up with pulmonary  GERD Started on PPI per pulmonology, continue  Lactic acidosis.   Patient appears little dry and he was hypoxic on arrival.  He was afebrile and no leukocytosis.   Received a dose of ceftriaxone and Zithromax in the ED, then was discontinued per admitting provider. Received IV fluid Lactic acid level down trending, repeat AM Procalcitonin 0.57, repeat 0.39  Elevated troponin.   No evidence of acute ischemia on twelve-lead EKG Troponin peaked at 27 History of nonischemic cardiomyopathy  Follow results of 2D echo.  Nonischemic cardiomyopathy.   LV EF was improved on recent echo.  Was compliant with his diuretic and was taking both torsemide and Lasix??  2D echo completed on 07/24/2020, results are pending. BNP normal  Improving hypervolemic hyponatremia Presented with serum  sodium of 126  Serum sodium is uptrending to 131>132. Continue to monitor Repeat BMP in the morning  Resolved post repletion: Hypokalemia Presented with serum potassium 2.8>> 4.0. Repleted intravenously and orally  Nicotine dependence.   Counseling was provided. -Nicotine patch.  Alcohol use disorder with concern for withdrawal Drinks 1 pint of liquor every other day Continue CIWA protocol   DVT prophylaxis: Lovenox subcu daily Code Status: Full code Family Communication: Discussed with patient Disposition Plan: Home Consults called: Cardiology, pulmonary.    Status is: Inpatient    Dispo:  Patient From: Home  Planned Disposition: Home  Expected discharge date: 07/26/20  Medically stable for discharge: No, ongoing management of recurrent syncope.         Objective: Vitals:   07/24/20 1649 07/24/20 2334 07/25/20 0440 07/25/20 1131  BP: 123/71 104/68 120/79 (!) 138/95  Pulse: 91 81 84 85  Resp: 18 20 18 16   Temp: 98.7 F (37.1 C) 98.3 F (36.8 C) 98.4 F (36.9 C) 98.6 F (37 C)  TempSrc: Oral Axillary Oral Oral  SpO2: 97% 98% 99% 97%   No intake or output data in the 24 hours ending 07/25/20 1319 There were no vitals filed for this visit.  Exam:  . General: 45 y.o. year-old male obese in no acute stress.  Alert and oriented x3.   . Cardiovascular: Regular rate and rhythm no rubs or gallops.. . Respiratory: Mild rales at bases diffuse wheezing noted bilaterally.  Good inspiratory effort.. . Abdomen: Obese nontender normal bowel sounds present. . Musculoskeletal: 1+ pitting edema in lower extremities.   . Skin: No ulcerative lesions noted. 47 Psychiatry: Mood is appropriate for condition and setting.   Data Reviewed: CBC: Recent Labs  Lab 07/22/20 1326 07/22/20 1611 07/22/20 2256 07/24/20 0609 07/24/20 0929  WBC 6.2  --  5.9 11.5* 10.2  NEUTROABS  --   --   --   --  9.3*  HGB 15.8 16.3 15.1 15.2 14.9  HCT 42.4 48.0 43.4 42.2 41.8  MCV  105.7*  --  108.2* 109.6* 110.6*  PLT 169  --  147* 134* 124*   Basic Metabolic Panel: Recent Labs  Lab 07/22/20 1326 07/22/20 1611 07/22/20 2256 07/23/20 0410 07/24/20 0543 07/24/20 1352 07/25/20 0228  NA 128* 126*  --  131* 132*  --  133*  K 3.9 2.8*  --  2.8* 4.0  --  3.7  CL 85*  --   --  85* 87*  --  90*  CO2 25  --   --  28 31  --  29  GLUCOSE 124*  --   --  105* 101*  --  129*  BUN 25*  --   --  23* 17  --  14  CREATININE 1.65*  --  1.44* 1.40* 1.21  --  1.16  CALCIUM 8.3*  --   --  8.3* 7.9*  --  7.7*  MG  --   --   --  1.1* 1.9 1.7  --   PHOS  --   --   --   --   --  <1.0* 2.4*   GFR: CrCl cannot be calculated (Unknown ideal weight.). Liver Function Tests: Recent  Labs  Lab 07/23/20 0410  AST 108*  ALT 71*  ALKPHOS 142*  BILITOT 2.3*  PROT 5.5*  ALBUMIN 2.6*   No results for input(s): LIPASE, AMYLASE in the last 168 hours. No results for input(s): AMMONIA in the last 168 hours. Coagulation Profile: No results for input(s): INR, PROTIME in the last 168 hours. Cardiac Enzymes: No results for input(s): CKTOTAL, CKMB, CKMBINDEX, TROPONINI in the last 168 hours. BNP (last 3 results) Recent Labs    05/25/20 1138  PROBNP 409*   HbA1C: No results for input(s): HGBA1C in the last 72 hours. CBG: No results for input(s): GLUCAP in the last 168 hours. Lipid Profile: No results for input(s): CHOL, HDL, LDLCALC, TRIG, CHOLHDL, LDLDIRECT in the last 72 hours. Thyroid Function Tests: No results for input(s): TSH, T4TOTAL, FREET4, T3FREE, THYROIDAB in the last 72 hours. Anemia Panel: No results for input(s): VITAMINB12, FOLATE, FERRITIN, TIBC, IRON, RETICCTPCT in the last 72 hours. Urine analysis:    Component Value Date/Time   COLORURINE YELLOW 07/22/2020 1829   APPEARANCEUR CLEAR 07/22/2020 1829   LABSPEC 1.031 (H) 07/22/2020 1829   PHURINE 5.0 07/22/2020 1829   GLUCOSEU NEGATIVE 07/22/2020 1829   HGBUR NEGATIVE 07/22/2020 1829   BILIRUBINUR NEGATIVE  07/22/2020 1829   KETONESUR NEGATIVE 07/22/2020 1829   PROTEINUR NEGATIVE 07/22/2020 1829   NITRITE NEGATIVE 07/22/2020 1829   LEUKOCYTESUR NEGATIVE 07/22/2020 1829   Sepsis Labs: @LABRCNTIP (procalcitonin:4,lacticidven:4)  ) Recent Results (from the past 240 hour(s))  Resp Panel by RT-PCR (Flu A&B, Covid) Nasopharyngeal Swab     Status: None   Collection Time: 07/22/20  3:17 PM   Specimen: Nasopharyngeal Swab; Nasopharyngeal(NP) swabs in vial transport medium  Result Value Ref Range Status   SARS Coronavirus 2 by RT PCR NEGATIVE NEGATIVE Final    Comment: (NOTE) SARS-CoV-2 target nucleic acids are NOT DETECTED.  The SARS-CoV-2 RNA is generally detectable in upper respiratory specimens during the acute phase of infection. The lowest concentration of SARS-CoV-2 viral copies this assay can detect is 138 copies/mL. A negative result does not preclude SARS-Cov-2 infection and should not be used as the sole basis for treatment or other patient management decisions. A negative result may occur with  improper specimen collection/handling, submission of specimen other than nasopharyngeal swab, presence of viral mutation(s) within the areas targeted by this assay, and inadequate number of viral copies(<138 copies/mL). A negative result must be combined with clinical observations, patient history, and epidemiological information. The expected result is Negative.  Fact Sheet for Patients:  09/19/20  Fact Sheet for Healthcare Providers:  BloggerCourse.com  This test is no t yet approved or cleared by the [employer] FDA and  has been authorized for detection and/or diagnosis of SARS-CoV-2 by FDA under an Emergency Use Authorization (EUA). This EUA will remain  in effect (meaning this test can be used) for the duration of the COVID-19 declaration under Section 564(b)(1) of the Act, 21 U.S.C.section 360bbb-3(b)(1), unless the  authorization is terminated  or revoked sooner.       Influenza A by PCR NEGATIVE NEGATIVE Final   Influenza B by PCR NEGATIVE NEGATIVE Final    Comment: (NOTE) The Xpert Xpress SARS-CoV-2/FLU/RSV plus assay is intended as an aid in the diagnosis of influenza from Nasopharyngeal swab specimens and should not be used as a sole basis for treatment. Nasal washings and aspirates are unacceptable for Xpert Xpress SARS-CoV-2/FLU/RSV testing.  Fact Sheet for Patients: [place]  Fact Sheet for Healthcare Providers: BloggerCourse.com  This test is  not yet approved or cleared by the [place] and has been authorized for detection and/or diagnosis of SARS-CoV-2 by FDA under an Emergency Use Authorization (EUA). This EUA will remain in effect (meaning this test can be used) for the duration of the COVID-19 declaration under Section 564(b)(1) of the Act, 21 U.S.C. section 360bbb-3(b)(1), unless the authorization is terminated or revoked.  Performed at [facility], [address], [place], [place] [address]       Studies: No results found.  Scheduled Meds: . calcium citrate  200 mg of elemental calcium Oral TID  . carvedilol  12.5 mg Oral BID  . enoxaparin (LOVENOX) injection  40 mg Subcutaneous Q24H  . fluticasone  1 spray Each Nare BID  . folic acid  1 mg Oral Daily  . gabapentin  200 mg Oral TID  . magnesium oxide  400 mg Oral BID  . methylPREDNISolone (SOLU-MEDROL) injection  40 mg Intravenous Q12H  . mometasone-formoterol  2 puff Inhalation BID  . multivitamin with minerals  1 tablet Oral Daily  . nicotine  14 mg Transdermal Daily  . pantoprazole  40 mg Oral Q1200  . potassium & sodium phosphates  1 packet Oral TID WC & HS  . thiamine  100 mg Oral Daily   Or  . thiamine  100 mg Intravenous Daily  . thiamine  100 mg Oral Daily    Continuous Infusions:    LOS: 2 days     Darlin Drop,  MD [facility] Pager [phone]  If 7PM-7AM, please contact night-coverage [email or web address] Password [email or web address] 07/25/2020, 1:19 PM

## 2020-07-25 NOTE — Progress Notes (Signed)
Progress Note  Patient Name: Jesus Fuller Date of Encounter: 07/25/2020  [facility] Cardiologist: Jodelle Red, MD   Subjective   Feeling a little bit better.  Continues to have wheezing.  No recurrent syncope.  Inpatient Medications    Scheduled Meds: . calcium citrate  200 mg of elemental calcium Oral TID  . carvedilol  12.5 mg Oral BID  . enoxaparin (LOVENOX) injection  40 mg Subcutaneous Q24H  . fluticasone  1 spray Each Nare BID  . folic acid  1 mg Oral Daily  . gabapentin  200 mg Oral TID  . magnesium oxide  400 mg Oral BID  . methylPREDNISolone (SOLU-MEDROL) injection  40 mg Intravenous Q12H  . mometasone-formoterol  2 puff Inhalation BID  . multivitamin with minerals  1 tablet Oral Daily  . nicotine  14 mg Transdermal Daily  . pantoprazole  40 mg Oral Q1200  . potassium & sodium phosphates  1 packet Oral TID WC & HS  . thiamine  100 mg Oral Daily   Or  . thiamine  100 mg Intravenous Daily  . thiamine  100 mg Oral Daily   Continuous Infusions:  PRN Meds: acetaminophen **OR** acetaminophen, albuterol, alum & mag hydroxide-simeth, guaiFENesin, iohexol, lip balm, LORazepam **OR** LORazepam, metoCLOPramide (REGLAN) injection, ondansetron **OR** ondansetron (ZOFRAN) IV, polyethylene glycol   Vital Signs    Vitals:   07/24/20 1649 07/24/20 2334 07/25/20 0440 07/25/20 1131  BP: 123/71 104/68 120/79 (!) 138/95  Pulse: 91 81 84 85  Resp: 18 20 18 16   Temp: 98.7 F (37.1 C) 98.3 F (36.8 C) 98.4 F (36.9 C) 98.6 F (37 C)  TempSrc: Oral Axillary Oral Oral  SpO2: 97% 98% 99% 97%   No intake or output data in the 24 hours ending 07/25/20 1151 Last 3 Weights 06/10/2020 05/25/2020 05/25/2020  Weight (lbs) 250 lb 254 lb 254 lb  Weight (kg) 113.399 kg 115.214 kg 115.214 kg      Telemetry    Sinus rhythm, sinus tachycardia- Personally Reviewed  ECG    n/a - Personally Reviewed  Physical Exam   VS:  BP (!) 138/95 (BP Location: Left Arm)   Pulse  85   Temp 98.6 F (37 C) (Oral)   Resp 16   SpO2 97%  , BMI There is no height or weight on file to calculate BMI. GENERAL:  Chronically ill-appearing.  No acute distress. HEENT: Pupils equal round and reactive, fundi not visualized, oral mucosa unremarkable NECK:  No jugular venous distention, waveform within normal limits, carotid upstroke brisk and symmetric, no bruits LUNGS: Diffuse expiratory wheezing HEART:  RRR.  PMI not displaced or sustained,S1 and S2 within normal limits, no S3, no S4, no clicks, no rubs, no murmurs ABD:  Flat, positive bowel sounds normal in frequency in pitch, no bruits, no rebound, no guarding, no midline pulsatile mass, no hepatomegaly, no splenomegaly EXT:  2 plus pulses throughout, no edema, no cyanosis no clubbing SKIN:  No rashes no nodules NEURO:  Cranial nerves II through XII grossly intact, motor grossly intact throughout PSYCH:  Cognitively intact, oriented to person place and time   Labs    High Sensitivity Troponin:   Recent Labs  Lab 07/22/20 1326 07/22/20 1556  TROPONINIHS 18* 27*      Chemistry Recent Labs  Lab 07/23/20 0410 07/24/20 0543 07/25/20 0228  NA 131* 132* 133*  K 2.8* 4.0 3.7  CL 85* 87* 90*  CO2 28 31 29   GLUCOSE 105* 101*  129*  BUN 23* 17 14  CREATININE 1.40* 1.21 1.16  CALCIUM 8.3* 7.9* 7.7*  PROT 5.5*  --   --   ALBUMIN 2.6*  --   --   AST 108*  --   --   ALT 71*  --   --   ALKPHOS 142*  --   --   BILITOT 2.3*  --   --   GFRNONAA >60 >60 >60  ANIONGAP 18* 14 14     Hematology Recent Labs  Lab 07/22/20 2256 07/24/20 0609 07/24/20 0929  WBC 5.9 11.5* 10.2  RBC 4.01* 3.85* 3.78*  HGB 15.1 15.2 14.9  HCT 43.4 42.2 41.8  MCV 108.2* 109.6* 110.6*  MCH 37.7* 39.5* 39.4*  MCHC 34.8 36.0 35.6  RDW 15.9* 16.4* 16.2*  PLT 147* 134* 124*    BNP Recent Labs  Lab 07/22/20 1821 07/22/20 2256  BNP 29.4 35.8     DDimer No results for input(s): DDIMER in the last 168 hours.   Radiology     ECHOCARDIOGRAM COMPLETE  Result Date: 07/24/2020    ECHOCARDIOGRAM REPORT   Patient Name:   NARAYAN SCULL Date of Exam: 07/24/2020 Medical Rec #:  [id number]      Height:       69.0 in Accession #:    [id number]     Weight:       250.0 lb Date of Birth:  July 29, 1975      BSA:          2.272 m Patient Age:    44 years       BP:           118/74 mmHg Patient Gender: M              HR:           89 bpm. Exam Location:  Inpatient Procedure: 2D Echo Indications:    Syncope 780.2 / R55  History:        Patient has prior history of Echocardiogram examinations, most                 recent 02/03/2020. Signs/Symptoms:Syncope and Chest Pain; Risk                 Factors:Hypertension and Current Smoker. ETOH, NICM.  Sonographer:    Jeryl Columbia Referring Phys: [id number] LAURA R INGOLD IMPRESSIONS  1. Left ventricular ejection fraction, by estimation, is 55 to 60%. The left ventricle has normal function. Left ventricular endocardial border not optimally defined to evaluate regional wall motion. Left ventricular diastolic parameters were normal.  2. Right ventricular systolic function is normal. The right ventricular size is normal. Tricuspid regurgitation signal is inadequate for assessing PA pressure.  3. The mitral valve was not well visualized. No evidence of mitral valve regurgitation.  4. The aortic valve was not well visualized. Aortic valve regurgitation is not visualized.  5. Technically difficult study with very poor acoustic windows. FINDINGS  Left Ventricle: Left ventricular ejection fraction, by estimation, is 55 to 60%. The left ventricle has normal function. Left ventricular endocardial border not optimally defined to evaluate regional wall motion. Definity contrast agent was given IV to delineate the left ventricular endocardial borders. The left ventricular internal cavity size was normal in size. There is no left ventricular hypertrophy. Left ventricular diastolic parameters were normal. Right Ventricle: The right  ventricular size is normal. Right vetricular wall thickness was not well visualized. Right ventricular systolic function is normal. Tricuspid regurgitation signal  is inadequate for assessing PA pressure. Left Atrium: Left atrial size was not well visualized. Right Atrium: Right atrial size was not well visualized. Pericardium: There is no evidence of pericardial effusion. Mitral Valve: The mitral valve was not well visualized. No evidence of mitral valve regurgitation. Tricuspid Valve: The tricuspid valve is not well visualized. Tricuspid valve regurgitation is not demonstrated. Aortic Valve: The aortic valve was not well visualized. Aortic valve regurgitation is not visualized. Pulmonic Valve: The pulmonic valve was not well visualized. Pulmonic valve regurgitation is not visualized. Aorta: The aortic root is normal in size and structure. Venous: The inferior vena cava was not well visualized. IAS/Shunts: The interatrial septum was not well visualized.  LEFT VENTRICLE PLAX 2D LVIDd:         4.60 cm  Diastology LVIDs:         3.10 cm  LV e' medial:    11.60 cm/s LV PW:         1.00 cm  LV E/e' medial:  6.6 LV IVS:        1.00 cm  LV e' lateral:   14.30 cm/s LVOT diam:     2.10 cm  LV E/e' lateral: 5.3 LVOT Area:     3.46 cm  LEFT ATRIUM         Index LA diam:    2.40 cm 1.06 cm/m   AORTA Ao Root diam: 2.40 cm MITRAL VALVE MV Area (PHT): 3.77 cm    SHUNTS MV Decel Time: 201 msec    Systemic Diam: 2.10 cm MV E velocity: 76.30 cm/s MV A velocity: 30.40 cm/s MV E/A ratio:  2.51 Marca Ancona MD Electronically signed by Marca Ancona MD Signature Date/Time: 07/24/2020/4:11:09 PM    Final     Cardiac Studies   Echo 07/24/2020: 1. Left ventricular ejection fraction, by estimation, is 55 to 60%. The  left ventricle has normal function. Left ventricular endocardial border  not optimally defined to evaluate regional wall motion. Left ventricular  diastolic parameters were normal.  2. Right ventricular systolic  function is normal. The right ventricular  size is normal. Tricuspid regurgitation signal is inadequate for assessing  PA pressure.  3. The mitral valve was not well visualized. No evidence of mitral valve  regurgitation.  4. The aortic valve was not well visualized. Aortic valve regurgitation  is not visualized.  5. Technically difficult study with very poor acoustic windows.   Patient Profile     45 y.o. male with morbid obesity, recurrent cough syncope, chronic systolic and diastolic heart failure (LVEF recovered), DVT previously treated with Eliquis chest pain, EtOH and tobacco abuse,   Assessment & Plan    # Cough syncope:  Patient had an episode of posttussive syncope, which has happened in the past.  His ILR was interrogated and there were no bradycardia arrhythmias.  Appreciate pulmonology input on upper airway syndrome and cough.  Continue this and management of GERD. Maintain K>4.  # Chronic systolic and diastolic heart failure:  # Hypertension:  LVEF previously reduced to 30 to 35%.  It improved to 60 to 65% and was preserved on his echo yesterday.  Continue carvedilol.  He is hyponatremic.  Recommend limiting fluids to 1.5L daily.  Also limit/eliminate EtOH consumption.   # EtOH use disorder: # Electrolyte abnormalities: Management per primary team.  Cessation advised.    [facility] will sign off.   Medication Recommendations:  No chagnes Other recommendations (labs, testing, etc):  none Follow up as an  outpatient: He is scheduled to be seen 1/10.  If he is still in the hospital please notify us so that we can arrange follow-up.  For questions or updates, please contact [facility] Please consult [email or web address] for contact info under        Signed, Chilton Si, MD  07/25/2020, 11:51 AM

## 2020-07-25 NOTE — Progress Notes (Addendum)
NAME:  Jesus Fuller, MRN:  [id number], DOB:  08-Feb-1976, LOS: 2 ADMISSION DATE:  07/22/2020, CONSULTATION DATE:  07/24/20 REFERRING MD:  Margo Aye - TRH, CHIEF COMPLAINT:  Cough with syncope  Brief History:  45 yo M smoker with recurrent episodes of cough syncope for the past 6 months Loop recorder did not show any arrhythmias  History of Present Illness:  45 yo M PMH Asthma, chronic cough, tobacco use disorder, systolic and diastolic heart failure, NICM, prior DVT, who presented to ED 1/5 following syncopal episode in which the patient fell. Syncopal episode associated with coughing, which has been ongoing problem for patient. Cardiology was consulted for evaluation of possible cardiac etiology of possttussive syncope, including device interrogation of implanted loop recorder, but have not been revealing.  Noted by EP that patient reports frequent rescue inhaler use, and is wheezing on exam.   Pulm consult for asthma management.  Patient reports a few coughing + syncopal episodes "growing up," but starting occurring regularly approx 1 year ago, including a syncopal episode that occurred while driving. Approx 6 months ago, these events increased in frequency again following a viral-type URI sx. Reports worsening heartburn-like symptoms and inability to afford OTC medications other than Tums for this. Coughing is worse at night when pt is flat -- sometimes pt feels post-nasal drip and often notices reflux. Endorses intermittent seasonal allergies but does not take medication for allergies. Is prescribed albuterol by Cardiology. Endorses ongoing tobacco use -- started smoking at age 41. 3 months ago, has cut back from 2.5 ppd to 1 ppd and is trying to quit.   Past Medical History:  Systolic and diastolic HF HTN LLE DVT NICM Tobacco abuse Asthma  Hepatic steatosis  Significant Hospital Events:   1/5 admitted to [facility]. Cardiology consulted  1/6 Cards EP consulted.  1/7 PCCM consulted for asthma   Consults:  Cards Cards EP PCCM  Procedures:    Significant Diagnostic Tests:  CTA chest> no PE. No pulm edema or ASD. Distal esophageal wall thickening. Small hiatal hernia. Hepatic steatosis  Micro Data:  1/5 COVID negative   Antimicrobials:    Interim History / Subjective:   Coughing unchanged, minimal expectoration Afebrile Mild dyspnea but has mostly been in bed Objective   Blood pressure (!) 138/95, pulse 85, temperature 98.6 F (37 C), temperature source Oral, resp. rate 16, SpO2 97 %.       No intake or output data in the 24 hours ending 07/25/20 1319 There were no vitals filed for this visit.  Examination: Gen. Pleasant, obese, in no distress ENT - no lesions, no post nasal drip, class III airway Neck: No JVD, no thyromegaly, no carotid bruits Lungs: no use of accessory muscles, no dullness to percussion, decreased with faint expiratory rhonchi  Cardiovascular: Rhythm regular, heart sounds  normal, no murmurs or gallops, no peripheral edema Musculoskeletal: No deformities, no cyanosis or clubbing , no tremors   Resolved Hospital Problem list     Assessment & Plan:     Chronic cough-- possibly multifactorial: is wheezing, has reflux, intermittent allergies / post nasal drip.  Recurrent cough syncope - -Likely related to decreased preload with coughing rather than vagal arrhythmias since no arrhythmias picked up on loop recorder  -reported childhood asthma (possible severe-persistent asthma?)  -long-time smoker -- possible COPD -GERD (heartburn + reflux + esophageal wall thickening on CT)  -post nasal drip/ allergies  Treating as COPD/asthma exacerbation P -Continue IV Solu-Medrol 40 every 12, once bronchospasm subsides can  switch to oral prednisone 40 mg with a 2-week taper hopefully by tomorrow. -[place]one -after discharge can switch to other steroid/LABA alternative that he can get from community [facility] -Protonix daily for GERD and  if he has breakthrough can increase this to twice daily. -Flonase 1 spray each nare  On discharge, we will obtain 2-week pulmonary office follow-up and schedule PFTs and home sleep study as an outpatient   Cyril Mourning MD. FCCP. [facility] See Amion for pager  If no response to pager , please call [phone]  After 7:00 pm call Elink  [phone]    07/25/2020, 1:19 PM

## 2020-07-26 LAB — BASIC METABOLIC PANEL
Anion gap: 14 (ref 5–15)
BUN: 16 mg/dL (ref 6–20)
CO2: 32 mmol/L (ref 22–32)
Calcium: 7.4 mg/dL — ABNORMAL LOW (ref 8.9–10.3)
Chloride: 87 mmol/L — ABNORMAL LOW (ref 98–111)
Creatinine, Ser: 1.25 mg/dL — ABNORMAL HIGH (ref 0.61–1.24)
GFR, Estimated: >60 mL/min (ref >60)
Glucose, Bld: 112 mg/dL — ABNORMAL HIGH (ref 70–99)
Potassium: 3.3 mmol/L — ABNORMAL LOW (ref 3.5–5.1)
Sodium: 133 mmol/L — ABNORMAL LOW (ref 135–145)

## 2020-07-26 LAB — MAGNESIUM: Magnesium: 1.6 mg/dL — ABNORMAL LOW (ref 1.7–2.4)

## 2020-07-26 LAB — PHOSPHORUS: Phosphorus: 1.8 mg/dL — ABNORMAL LOW (ref 2.5–4.6)

## 2020-07-26 MED ORDER — POTASSIUM PHOSPHATES 15 MMOLE/5ML IV SOLN
30.0000 mmol | Freq: Once | INTRAVENOUS | Status: AC
  Administered 2020-07-26: 30 mmol via INTRAVENOUS
  Filled 2020-07-26: qty 10

## 2020-07-26 MED ORDER — POTASSIUM CHLORIDE CRYS ER 20 MEQ PO TBCR
40.0000 meq | EXTENDED_RELEASE_TABLET | Freq: Two times a day (BID) | ORAL | Status: AC
  Administered 2020-07-26 (×2): 40 meq via ORAL
  Filled 2020-07-26 (×2): qty 2

## 2020-07-26 NOTE — Progress Notes (Signed)
PROGRESS NOTE  ANDRUW Fuller JJO:[id number] DOB: Mar 08, 1976 DOA: 07/22/2020 PCP: Arvilla Market, DO  HPI/Recap of past 24 hours:   Jesus Fuller is a 45 y.o. male with medical history significant of asthma, chronic cough lasting 5-6 months, ongoing tobacco use disorder, combined chronic systolic and diastolic CHF, NICM LVEF 35% in 2020, HTN, LLE dvt now offEliquis,  who presented to [facility] ED after having a recurrent syncopal episode at home.  History of syncopal episodes associated with cough, posttussive syncope.  He wears a implantable loop recorder.  On the day of presentation while he was walking in kitchen he collapsed after coughing.  Associated with chest pain after his fall, thought he fractured his ribs so came to ED for further evaluation.    ED Course: Afebrile, hypotensive with blood pressure of 70/56 and mildly hypoxic requiring 2 L of oxygen.  Labs positive for lactic acid of 4, troponin of 27, sodium of 128, chloride of 85, BUN 25, creatinine of 1.65 with baseline around 1, COVID-19 PCR negative, CTA was negative for PE or any other airspace opacity.  Cardiology was consulted from ED.  He received 1 dose of Zithromax and ceftriaxone along with 1 L bolus of normal saline.  Hospital course complicated by persistent cough for which pulmonary was consulted.  Was started on IV Solu-Medrol 40 mg twice daily and bronchodilators with improvement of his symptomatology.  Seen by cardiology, his implantable loop recorder was interrogated, no evidence of arrhythmia or sinus pauses.  Cardiology signed off, he has a follow-up appointment on Monday, 07/27/2020.  The patient is aware of this and will call to reschedule the appointment on Monday.    07/26/20: He feels better today however still has nightly coughing spells.  He is currently on IV Solu-Medrol 40 mg twice daily, also ongoing diuresing on IV Lasix 40 mg twice daily.  Assessment/Plan: Active Problems:   Syncope and collapse    Troponin level elevated   Cough syncope   COPD with acute exacerbation (HCC)  Recurrent cough syncope with prior diagnosis of posttussive syncope Patient with diagnosis of post tussive syncope, think that he is having more episodes as he is having more cough for the past 5 months. Cardiology was consulted and they advised admission for observation so they can interrogate his implantable loop recorder, no evidence of arrhythmia or sinus pauses. Echocardiogram revealed improved LVEF 60 to 65% from 30 to 35% previously. UA and UDS unremarkable Seen by cardiology, signed off.  No changes in his home cardiac medications.  He is scheduled for a follow-up on 07/27/2020.  Patient is aware of this appointment, he stated he will call on Monday and reschedule the appointment.  Upper airway cough syndrome/asthma exacerbation Seen by pulmonary Recommended IV Solu-Medrol 40 mg twice daily until bronchospasm subsides, continue Continue bronchodilator, Flonase and Protonix for GERD as recommended by pulmonary.  Chronic combined systolic and diastolic CHF Prior to admission he was on torsemide, held Ongoing diuresing Continue IV Lasix 40 mg twice daily Closely monitor blood pressure and electrolytes while on IV diuretics Closely monitor renal function Continue strict I's and O's and daily weight  Refractory hypokalemia in the setting of IV diuretics Potassium 3.3 Replete and recheck  Refractory hypomagnesia in the setting of IV diuretics Magnesium 1.6 Replete and recheck  Hypocalcemia Repleted orally  Severe hypophosphatemia Replete and recheck.  Likely OSA Patient will need home sleep study as outpatient Will need to follow-up with pulmonary  GERD Continue PPI per  pulmonology, continue  Lactic acidosis.   Patient appears little dry and he was hypoxic on arrival.  He was afebrile and no leukocytosis.   Received a dose of ceftriaxone and Zithromax in the ED, then was discontinued per  admitting provider. Received IV fluid Lactic acid level down trending, repeat AM Procalcitonin 0.57, repeat 0.39  Elevated troponin.   No evidence of acute ischemia on twelve-lead EKG Troponin peaked at 27 History of nonischemic cardiomyopathy Follow results of 2D echo 07/24/2020, normal LVEF 55 to 60%. Denies any anginal symptoms at the time of this visit.  Nonischemic cardiomyopathy.   LV EF was improved on recent echo.  Was compliant with his diuretic and was taking both torsemide  2D echo completed on 07/24/2020  Improving hypervolemic hyponatremia Presented with serum sodium of 126>> 133 Continue to monitor Repeat BMP in the morning  Nicotine dependence.   Counseling was provided. -Nicotine patch.  Alcohol use disorder with concern for withdrawal Drinks 1 pint of liquor every other day Continue CIWA protocol   DVT prophylaxis: Lovenox subcu daily Code Status: Full code Family Communication: Discussed with patient Disposition Plan: Home Consults called: Cardiology, pulmonary.    Status is: Inpatient    Dispo:  Patient From: Home  Planned Disposition: Home  Expected discharge date: 07/27/20  Medically stable for discharge: No, ongoing management of recurrent syncope, ongoing diuresing, ongoing treatment for asthma exacerbation..         Objective: Vitals:   07/26/20 0446 07/26/20 0813 07/26/20 1027 07/26/20 1106  BP: (!) 144/97 (!) 118/97  (!) 135/105  Pulse: 73   97  Resp: 20   19  Temp: 98.2 F (36.8 C)   98.4 F (36.9 C)  TempSrc: Oral   Oral  SpO2: 99%   94%  Weight:   120.8 kg   Height:   5\' 9"  (1.753 m)     Intake/Output Summary (Last 24 hours) at 07/26/2020 1500 Last data filed at 07/26/2020 1000 Gross per 24 hour  Intake --  Output 1400 ml  Net -1400 ml   Filed Weights   07/26/20 1027  Weight: 120.8 kg    Exam:  . General: 45 y.o. year-old male obese in no acute stress.  Alert and oriented x3.   . Cardiovascular: Regular rate  and rhythm no rubs or gallops. 45 Respiratory: Mild diffuse wheezing bilaterally.  Good inspiratory effort.  . Abdomen: Obese nontender normal bowel sounds present . Musculoskeletal: 1+ pitting edema in lower extremities bilaterally. . Skin: No ulcerative lesions noted . Psychiatry: Mood is appropriate for condition and setting.   Data Reviewed: CBC: Recent Labs  Lab 07/22/20 1326 07/22/20 1611 07/22/20 2256 07/24/20 0609 07/24/20 0929  WBC 6.2  --  5.9 11.5* 10.2  NEUTROABS  --   --   --   --  9.3*  HGB 15.8 16.3 15.1 15.2 14.9  HCT 42.4 48.0 43.4 42.2 41.8  MCV 105.7*  --  108.2* 109.6* 110.6*  PLT 169  --  147* 134* 124*   Basic Metabolic Panel: Recent Labs  Lab 07/22/20 1326 07/22/20 1611 07/22/20 2256 07/23/20 0410 07/24/20 0543 07/24/20 1352 07/25/20 0228 07/26/20 0412  NA 128* 126*  --  131* 132*  --  133* 133*  K 3.9 2.8*  --  2.8* 4.0  --  3.7 3.3*  CL 85*  --   --  85* 87*  --  90* 87*  CO2 25  --   --  28 31  --  29 32  GLUCOSE 124*  --   --  105* 101*  --  129* 112*  BUN 25*  --   --  23* 17  --  14 16  CREATININE 1.65*  --  1.44* 1.40* 1.21  --  1.16 1.25*  CALCIUM 8.3*  --   --  8.3* 7.9*  --  7.7* 7.4*  MG  --   --   --  1.1* 1.9 1.7  --  1.6*  PHOS  --   --   --   --   --  <1.0* 2.4* 1.8*   GFR: Estimated Creatinine Clearance: 98.8 mL/min (A) (by C-G formula based on SCr of 1.25 mg/dL (H)). Liver Function Tests: Recent Labs  Lab 07/23/20 0410  AST 108*  ALT 71*  ALKPHOS 142*  BILITOT 2.3*  PROT 5.5*  ALBUMIN 2.6*   No results for input(s): LIPASE, AMYLASE in the last 168 hours. No results for input(s): AMMONIA in the last 168 hours. Coagulation Profile: No results for input(s): INR, PROTIME in the last 168 hours. Cardiac Enzymes: No results for input(s): CKTOTAL, CKMB, CKMBINDEX, TROPONINI in the last 168 hours. BNP (last 3 results) Recent Labs    05/25/20 1138  PROBNP 409*   HbA1C: No results for input(s): HGBA1C in the last 72  hours. CBG: No results for input(s): GLUCAP in the last 168 hours. Lipid Profile: No results for input(s): CHOL, HDL, LDLCALC, TRIG, CHOLHDL, LDLDIRECT in the last 72 hours. Thyroid Function Tests: No results for input(s): TSH, T4TOTAL, FREET4, T3FREE, THYROIDAB in the last 72 hours. Anemia Panel: No results for input(s): VITAMINB12, FOLATE, FERRITIN, TIBC, IRON, RETICCTPCT in the last 72 hours. Urine analysis:    Component Value Date/Time   COLORURINE YELLOW 07/22/2020 1829   APPEARANCEUR CLEAR 07/22/2020 1829   LABSPEC 1.031 (H) 07/22/2020 1829   PHURINE 5.0 07/22/2020 1829   GLUCOSEU NEGATIVE 07/22/2020 1829   HGBUR NEGATIVE 07/22/2020 1829   BILIRUBINUR NEGATIVE 07/22/2020 1829   KETONESUR NEGATIVE 07/22/2020 1829   PROTEINUR NEGATIVE 07/22/2020 1829   NITRITE NEGATIVE 07/22/2020 1829   LEUKOCYTESUR NEGATIVE 07/22/2020 1829   Sepsis Labs: @LABRCNTIP (procalcitonin:4,lacticidven:4)  ) Recent Results (from the past 240 hour(s))  Resp Panel by RT-PCR (Flu A&B, Covid) Nasopharyngeal Swab     Status: None   Collection Time: 07/22/20  3:17 PM   Specimen: Nasopharyngeal Swab; Nasopharyngeal(NP) swabs in vial transport medium  Result Value Ref Range Status   SARS Coronavirus 2 by RT PCR NEGATIVE NEGATIVE Final    Comment: (NOTE) SARS-CoV-2 target nucleic acids are NOT DETECTED.  The SARS-CoV-2 RNA is generally detectable in upper respiratory specimens during the acute phase of infection. The lowest concentration of SARS-CoV-2 viral copies this assay can detect is 138 copies/mL. A negative result does not preclude SARS-Cov-2 infection and should not be used as the sole basis for treatment or other patient management decisions. A negative result may occur with  improper specimen collection/handling, submission of specimen other than nasopharyngeal swab, presence of viral mutation(s) within the areas targeted by this assay, and inadequate number of viral copies(<138 copies/mL).  A negative result must be combined with clinical observations, patient history, and epidemiological information. The expected result is Negative.  Fact Sheet for Patients:  09/19/20  Fact Sheet for Healthcare Providers:  BloggerCourse.com  This test is no t yet approved or cleared by the [place] FDA and  has been authorized for detection and/or diagnosis of SARS-CoV-2 by FDA under an Emergency Use Authorization (  EUA). This EUA will remain  in effect (meaning this test can be used) for the duration of the COVID-19 declaration under Section 564(b)(1) of the Act, 21 U.S.C.section 360bbb-3(b)(1), unless the authorization is terminated  or revoked sooner.       Influenza A by PCR NEGATIVE NEGATIVE Final   Influenza B by PCR NEGATIVE NEGATIVE Final    Comment: (NOTE) The Xpert Xpress SARS-CoV-2/FLU/RSV plus assay is intended as an aid in the diagnosis of influenza from Nasopharyngeal swab specimens and should not be used as a sole basis for treatment. Nasal washings and aspirates are unacceptable for Xpert Xpress SARS-CoV-2/FLU/RSV testing.  Fact Sheet for Patients: BloggerCourse.com  Fact Sheet for Healthcare Providers: SeriousBroker.it  This test is not yet approved or cleared by the [place] FDA and has been authorized for detection and/or diagnosis of SARS-CoV-2 by FDA under an Emergency Use Authorization (EUA). This EUA will remain in effect (meaning this test can be used) for the duration of the COVID-19 declaration under Section 564(b)(1) of the Act, 21 U.S.C. section 360bbb-3(b)(1), unless the authorization is terminated or revoked.  Performed at [facility], [address], [place], [place] [address]       Studies: No results found.  Scheduled Meds: . calcium citrate  200 mg of elemental calcium Oral TID  . carvedilol  12.5 mg Oral BID   . enoxaparin (LOVENOX) injection  40 mg Subcutaneous Q24H  . fluticasone  1 spray Each Nare BID  . folic acid  1 mg Oral Daily  . furosemide  40 mg Intravenous BID  . gabapentin  200 mg Oral TID  . magnesium oxide  400 mg Oral BID  . methylPREDNISolone (SOLU-MEDROL) injection  40 mg Intravenous Q12H  . mometasone-formoterol  2 puff Inhalation BID  . multivitamin with minerals  1 tablet Oral Daily  . nicotine  14 mg Transdermal Daily  . pantoprazole  40 mg Oral Q1200  . potassium chloride  40 mEq Oral BID  . thiamine  100 mg Oral Daily   Or  . thiamine  100 mg Intravenous Daily  . thiamine  100 mg Oral Daily    Continuous Infusions:    LOS: 3 days     Darlin Drop, MD [facility] Pager [phone]  If 7PM-7AM, please contact night-coverage [email or web address] Password [email or web address] 07/26/2020, 3:00 PM

## 2020-07-26 NOTE — Progress Notes (Signed)
Patient received call from secretary that patient's tele was alarming V-tach. Patient was resting in bed watching TV and had no complaints. Writer assessed strip and found that patient had 25-30 beats of V-tach over the course of 3 min.

## 2020-07-27 ENCOUNTER — Ambulatory Visit: Payer: Self-pay | Admitting: Cardiology

## 2020-07-27 LAB — BASIC METABOLIC PANEL
Anion gap: 13 (ref 5–15)
BUN: 18 mg/dL (ref 6–20)
CO2: 31 mmol/L (ref 22–32)
Calcium: 6.5 mg/dL — ABNORMAL LOW (ref 8.9–10.3)
Chloride: 89 mmol/L — ABNORMAL LOW (ref 98–111)
Creatinine, Ser: 1.23 mg/dL (ref 0.61–1.24)
GFR, Estimated: >60 mL/min (ref >60)
Glucose, Bld: 161 mg/dL — ABNORMAL HIGH (ref 70–99)
Potassium: 3.7 mmol/L (ref 3.5–5.1)
Sodium: 133 mmol/L — ABNORMAL LOW (ref 135–145)

## 2020-07-27 LAB — MAGNESIUM: Magnesium: 1.4 mg/dL — ABNORMAL LOW (ref 1.7–2.4)

## 2020-07-27 LAB — PHOSPHORUS: Phosphorus: 2.1 mg/dL — ABNORMAL LOW (ref 2.5–4.6)

## 2020-07-27 MED ORDER — NICOTINE 21 MG/24HR TD PT24
21.0000 mg | MEDICATED_PATCH | Freq: Every day | TRANSDERMAL | Status: DC
  Administered 2020-07-27 – 2020-07-29 (×3): 21 mg via TRANSDERMAL
  Filled 2020-07-27 (×3): qty 1

## 2020-07-27 MED ORDER — HYDROCORTISONE (PERIANAL) 2.5 % EX CREA
TOPICAL_CREAM | Freq: Two times a day (BID) | CUTANEOUS | Status: DC | PRN
  Filled 2020-07-27: qty 28.35

## 2020-07-27 MED ORDER — CALCIUM GLUCONATE-NACL 2-0.675 GM/100ML-% IV SOLN
2.0000 g | Freq: Once | INTRAVENOUS | Status: AC
  Administered 2020-07-27: 2000 mg via INTRAVENOUS
  Filled 2020-07-27: qty 100

## 2020-07-27 MED ORDER — PREDNISONE 20 MG PO TABS
50.0000 mg | ORAL_TABLET | Freq: Every day | ORAL | Status: DC
  Administered 2020-07-27 – 2020-07-28 (×2): 50 mg via ORAL
  Filled 2020-07-27 (×2): qty 2

## 2020-07-27 MED ORDER — MAGNESIUM SULFATE 4 GM/100ML IV SOLN
4.0000 g | Freq: Once | INTRAVENOUS | Status: AC
  Administered 2020-07-27: 4 g via INTRAVENOUS
  Filled 2020-07-27: qty 100

## 2020-07-27 MED ORDER — FUROSEMIDE 10 MG/ML IJ SOLN
40.0000 mg | Freq: Three times a day (TID) | INTRAMUSCULAR | Status: DC
  Administered 2020-07-27 – 2020-07-29 (×6): 40 mg via INTRAVENOUS
  Filled 2020-07-27 (×7): qty 4

## 2020-07-27 MED ORDER — PHENYLEPHRINE-MINERAL OIL-PET 0.25-14-74.9 % RE OINT
1.0000 "application " | TOPICAL_OINTMENT | Freq: Two times a day (BID) | RECTAL | Status: DC | PRN
  Filled 2020-07-27: qty 57

## 2020-07-27 NOTE — Progress Notes (Signed)
NAME:  Jesus Fuller, MRN:  [id number], DOB:  12/12/1975, LOS: 4 ADMISSION DATE:  07/22/2020, CONSULTATION DATE:  07/24/20 REFERRING MD:  Margo Aye - TRH, CHIEF COMPLAINT:  Cough with syncope  Brief History:  45 yo M smoker with recurrent episodes of cough syncope for the past 6 months Loop recorder did not show any arrhythmias  History of Present Illness:  45 yo M PMH Asthma, chronic cough, tobacco use disorder, systolic and diastolic heart failure, NICM, prior DVT, who presented to ED 1/5 following syncopal episode in which the patient fell. Syncopal episode associated with coughing, which has been ongoing problem for patient. Cardiology was consulted for evaluation of possible cardiac etiology of possttussive syncope, including device interrogation of implanted loop recorder, but have not been revealing.  Noted by EP that patient reports frequent rescue inhaler use, and is wheezing on exam.   Pulm consult for asthma management.  Patient reports a few coughing + syncopal episodes "growing up," but starting occurring regularly approx 1 year ago, including a syncopal episode that occurred while driving. Approx 6 months ago, these events increased in frequency again following a viral-type URI sx. Reports worsening heartburn-like symptoms and inability to afford OTC medications other than Tums for this. Coughing is worse at night when pt is flat -- sometimes pt feels post-nasal drip and often notices reflux. Endorses intermittent seasonal allergies but does not take medication for allergies. Is prescribed albuterol by Cardiology. Endorses ongoing tobacco use -- started smoking at age 61. 3 months ago, has cut back from 2.5 ppd to 1 ppd and is trying to quit.   Past Medical History:  Systolic and diastolic HF HTN LLE DVT NICM Tobacco abuse Asthma  Hepatic steatosis  Significant Hospital Events:   1/5 admitted to [facility]. Cardiology consulted  1/6 Cards EP consulted.  1/7 PCCM consulted for asthma   Consults:  Cards Cards EP PCCM  Procedures:    Significant Diagnostic Tests:  CTA chest> no PE. No pulm edema or ASD. Distal esophageal wall thickening. Small hiatal hernia. Hepatic steatosis  Micro Data:  1/5 COVID negative   Antimicrobials:    Interim History / Subjective:   Cough and feels a little bit better Shortness of breath feels a little bit better Objective   Blood pressure (!) 121/91, pulse 100, temperature 98.3 F (36.8 C), temperature source Oral, resp. rate 18, height 5\' 9"  (1.753 m), weight 120.8 kg, SpO2 97 %.        Intake/Output Summary (Last 24 hours) at 07/27/2020 1411 Last data filed at 07/27/2020 0415 Gross per 24 hour  Intake 600.87 ml  Output 1225 ml  Net -624.13 ml   Filed Weights   07/26/20 1027  Weight: 120.8 kg    Examination: Gen. occasional coughs ENT -moist oral mucosa Neck: No JVD no thyromegaly, no adenopathy  Lungs: Clear breath sounds bilaterally Cardiovascular: S1-S2 appreciated Musculoskeletal: No deformities, no cyanosis or clubbing , no tremors   Resolved Hospital Problem list     Assessment & Plan:   Chronic cough -Multifactorial -Reflux, allergies, postnasal drip  Cough syncope -No arrhythmias on monitor  History of childhood asthma -Continue bronchodilators  Long-term smoker -Possibility of obstructive lung disease -Further work-up as outpatient  GERD  -Continue steroids -Dulera at discharge -Protonix for GERD -Nasal sprays   On discharge, we will obtain 2-week pulmonary office follow-up and schedule PFTs and home sleep study as an outpatient   Scheduled for follow-up with Dr. 09/23/20 as outpatient  Vassie Loll, MD  [facility] Pager: [phone]

## 2020-07-27 NOTE — Progress Notes (Signed)
PROGRESS NOTE  Jesus Fuller PPI:[id number] DOB: 06-Jul-1976 DOA: 07/22/2020 PCP: Arvilla Market, DO  HPI/Recap of past 24 hours:   SCOTTI LARACUENTE is a 45 y.o. male with medical history significant of asthma, chronic cough lasting 5-6 months, ongoing tobacco use disorder, combined chronic systolic and diastolic CHF, NICM LVEF 35% in 2020, HTN, LLE dvt now offEliquis,  who presented to [facility] ED after having a recurrent syncopal episode at home.  History of syncopal episodes associated with cough, posttussive syncope.  He wears a implantable loop recorder.  On the day of presentation while he was walking in kitchen he collapsed after coughing.  Associated with chest pain after his fall, thought he fractured his ribs so came to ED for further evaluation.    ED Course: Afebrile, hypotensive with blood pressure of 70/56 and mildly hypoxic requiring 2 L of oxygen.  Labs positive for lactic acid of 4, troponin of 27, sodium of 128, chloride of 85, BUN 25, creatinine of 1.65 with baseline around 1, COVID-19 PCR negative, CTA was negative for PE or any other airspace opacity.  Cardiology was consulted from ED.  He received 1 dose of Zithromax and ceftriaxone along with 1 L bolus of normal saline.  Hospital course complicated by persistent cough for which pulmonary was consulted.  Was started on IV Solu-Medrol 40 mg twice daily and bronchodilators with improvement of his symptomatology.  Seen by cardiology, his implantable loop recorder was interrogated, no evidence of arrhythmia or sinus pauses.  Cardiology signed off, he has a follow-up appointment on Monday, 07/27/2020.  The patient is aware of this and will call to reschedule the appointment on Monday.    07/27/20: Seen and examined at his bedside.  He feels his asthma is better controlled.  However he is still markedly volume overload.  Ongoing diuresing, increased IV Lasix to 40 mg 3 times daily.  Net I&O -839 cc.  Assessment/Plan: Active  Problems:   Syncope and collapse   Troponin level elevated   Cough syncope   COPD with acute exacerbation (HCC)  Improving recurrent cough syncope with prior diagnosis of posttussive syncope Patient with diagnosis of post tussive syncope, think that he is having more episodes as he is having more cough for the past 5 months. Cardiology was consulted and they advised admission for observation so they can interrogate his implantable loop recorder, no evidence of arrhythmia or sinus pauses. Echocardiogram revealed improved LVEF 60 to 65% from 30 to 35% previously. UA and UDS unremarkable Seen by cardiology, signed off.  No changes in his home cardiac medications.  He is scheduled for a follow-up on 07/27/2020.  Patient is aware of this appointment, he stated he will call and reschedule the appointment.  Volume overload in the setting of chronic combined systolic and diastolic CHF Ongoing diuresing  Upper airway cough syndrome/asthma exacerbation Seen by pulmonary Recommended IV Solu-Medrol 40 mg twice daily until bronchospasm subsides, switch to prednisone on 07/27/2020 Continue bronchodilator, Flonase and Protonix for GERD as recommended by pulmonary.  Chronic combined systolic and diastolic CHF Prior to admission he was on torsemide Ongoing diuresing, IV Lasix increased to 40 mg 3 times daily. Closely monitor blood pressure and electrolytes while on IV diuretics. Closely monitor renal function. Continue strict I's and O's and daily weight Net I&O -839 cc.  Resolved post repletion: Refractory hypokalemia in the setting of IV diuretics Potassium 3.3> 3.7 Replete and recheck  Refractory hypomagnesia in the setting of IV diuretics Magnesium 1.6>  1.4 Repleted intravenously with magnesium sulfate 4 g x 1 dose Repeat level in the morning  Hypocalcemia Serum calcium 6.5, corrected for albumin 7.6 Replete intravenously and orally  Severe hypophosphatemia Repleted intravenously and  orally.  Likely OSA Patient will need home sleep study as outpatient Will need to follow-up with pulmonary  GERD Continue PPI per pulmonology, continue  Lactic acidosis.   Patient appears little dry and he was hypoxic on arrival.  He was afebrile and no leukocytosis.   Received a dose of ceftriaxone and Zithromax in the ED, then was discontinued per admitting provider. Received IV fluid Lactic acid level down trended Procalcitonin 0.57, repeat 0.39  Elevated troponin.   No evidence of acute ischemia on twelve-lead EKG Troponin peaked at 27 History of nonischemic cardiomyopathy Follow results of 2D echo 07/24/2020, normal LVEF 55 to 60%. Denies any anginal symptoms at the time of this visit.  Nonischemic cardiomyopathy.   LV EF was improved on recent echo.   Was compliant with his diuretic and was taking torsemide  2D echo completed on 07/24/2020  Improving hypervolemic hyponatremia Presented with serum sodium of 126>> 133 Continue to monitor Repeat BMP in the morning  Nicotine dependence.   Counseling was provided. -Nicotine patch.  Alcohol use disorder with concern for withdrawal Drinks 1 pint of liquor every other day Continue CIWA protocol   DVT prophylaxis: Lovenox subcu daily Code Status: Full code Family Communication: Discussed with patient Disposition Plan: Home Consults called: Cardiology, pulmonary.    Status is: Inpatient    Dispo:  Patient From: Home  Planned Disposition: Home  Expected discharge date: 07/28/20  Medically stable for discharge: No, ongoing management of recurrent syncope, ongoing diuresing, ongoing treatment for asthma exacerbation..         Objective: Vitals:   07/26/20 2114 07/26/20 2346 07/27/20 0411 07/27/20 1233  BP:  107/64 121/86 (!) 121/91  Pulse:  85 77 100  Resp:  18 20 18   Temp:  99.1 F (37.3 C) 98.2 F (36.8 C) 98.3 F (36.8 C)  TempSrc:  Axillary Axillary Oral  SpO2: 98% 96% 99% 97%  Weight:       Height:        Intake/Output Summary (Last 24 hours) at 07/27/2020 1308 Last data filed at 07/27/2020 0415 Gross per 24 hour  Intake 600.87 ml  Output 1225 ml  Net -624.13 ml   Filed Weights   07/26/20 1027  Weight: 120.8 kg    Exam:  . General: 45 y.o. year-old male obese in no acute distress.  Alert oriented x3.  Volume overload on exam.   . Cardiovascular: Regular rate and rhythm no rubs or gallops.  45 Respiratory: Mild rales at bases no wheezing noted.  Good inspiratory effort. . Abdomen: Obese volume overload, nontender normal bowel sounds present.   . Musculoskeletal: 2+ pitting edema in lower extremities bilaterally . Skin: No ulcerative lesions noted. Marland Kitchen Psychiatry: Mood is appropriate for condition and setting.   Data Reviewed: CBC: Recent Labs  Lab 07/22/20 1326 07/22/20 1611 07/22/20 2256 07/24/20 0609 07/24/20 0929  WBC 6.2  --  5.9 11.5* 10.2  NEUTROABS  --   --   --   --  9.3*  HGB 15.8 16.3 15.1 15.2 14.9  HCT 42.4 48.0 43.4 42.2 41.8  MCV 105.7*  --  108.2* 109.6* 110.6*  PLT 169  --  147* 134* 124*   Basic Metabolic Panel: Recent Labs  Lab 07/23/20 0410 07/24/20 0543 07/24/20 1352 07/25/20 0228 07/26/20  7915 07/27/20 0754  NA 131* 132*  --  133* 133* 133*  K 2.8* 4.0  --  3.7 3.3* 3.7  CL 85* 87*  --  90* 87* 89*  CO2 28 31  --  29 32 31  GLUCOSE 105* 101*  --  129* 112* 161*  BUN 23* 17  --  14 16 18   CREATININE 1.40* 1.21  --  1.16 1.25* 1.23  CALCIUM 8.3* 7.9*  --  7.7* 7.4* 6.5*  MG 1.1* 1.9 1.7  --  1.6* 1.4*  PHOS  --   --  <1.0* 2.4* 1.8* 2.1*   GFR: Estimated Creatinine Clearance: 100.4 mL/min (by C-G formula based on SCr of 1.23 mg/dL). Liver Function Tests: Recent Labs  Lab 07/23/20 0410  AST 108*  ALT 71*  ALKPHOS 142*  BILITOT 2.3*  PROT 5.5*  ALBUMIN 2.6*   No results for input(s): LIPASE, AMYLASE in the last 168 hours. No results for input(s): AMMONIA in the last 168 hours. Coagulation Profile: No results for  input(s): INR, PROTIME in the last 168 hours. Cardiac Enzymes: No results for input(s): CKTOTAL, CKMB, CKMBINDEX, TROPONINI in the last 168 hours. BNP (last 3 results) Recent Labs    05/25/20 1138  PROBNP 409*   HbA1C: No results for input(s): HGBA1C in the last 72 hours. CBG: No results for input(s): GLUCAP in the last 168 hours. Lipid Profile: No results for input(s): CHOL, HDL, LDLCALC, TRIG, CHOLHDL, LDLDIRECT in the last 72 hours. Thyroid Function Tests: No results for input(s): TSH, T4TOTAL, FREET4, T3FREE, THYROIDAB in the last 72 hours. Anemia Panel: No results for input(s): VITAMINB12, FOLATE, FERRITIN, TIBC, IRON, RETICCTPCT in the last 72 hours. Urine analysis:    Component Value Date/Time   COLORURINE YELLOW 07/22/2020 1829   APPEARANCEUR CLEAR 07/22/2020 1829   LABSPEC 1.031 (H) 07/22/2020 1829   PHURINE 5.0 07/22/2020 1829   GLUCOSEU NEGATIVE 07/22/2020 1829   HGBUR NEGATIVE 07/22/2020 1829   BILIRUBINUR NEGATIVE 07/22/2020 1829   KETONESUR NEGATIVE 07/22/2020 1829   PROTEINUR NEGATIVE 07/22/2020 1829   NITRITE NEGATIVE 07/22/2020 1829   LEUKOCYTESUR NEGATIVE 07/22/2020 1829   Sepsis Labs: @LABRCNTIP (procalcitonin:4,lacticidven:4)  ) Recent Results (from the past 240 hour(s))  Resp Panel by RT-PCR (Flu A&B, Covid) Nasopharyngeal Swab     Status: None   Collection Time: 07/22/20  3:17 PM   Specimen: Nasopharyngeal Swab; Nasopharyngeal(NP) swabs in vial transport medium  Result Value Ref Range Status   SARS Coronavirus 2 by RT PCR NEGATIVE NEGATIVE Final    Comment: (NOTE) SARS-CoV-2 target nucleic acids are NOT DETECTED.  The SARS-CoV-2 RNA is generally detectable in upper respiratory specimens during the acute phase of infection. The lowest concentration of SARS-CoV-2 viral copies this assay can detect is 138 copies/mL. A negative result does not preclude SARS-Cov-2 infection and should not be used as the sole basis for treatment or other patient  management decisions. A negative result may occur with  improper specimen collection/handling, submission of specimen other than nasopharyngeal swab, presence of viral mutation(s) within the areas targeted by this assay, and inadequate number of viral copies(<138 copies/mL). A negative result must be combined with clinical observations, patient history, and epidemiological information. The expected result is Negative.  Fact Sheet for Patients:   Fact Sheet for Healthcare Providers:  09/19/20  This test is no t yet approved or cleared by the [employer] FDA and  has been authorized for detection and/or diagnosis of SARS-CoV-2 by FDA under an Emergency Use  Authorization (EUA). This EUA will remain  in effect (meaning this test can be used) for the duration of the COVID-19 declaration under Section 564(b)(1) of the Act, 21 U.S.C.section 360bbb-3(b)(1), unless the authorization is terminated  or revoked sooner.       Influenza A by PCR NEGATIVE NEGATIVE Final   Influenza B by PCR NEGATIVE NEGATIVE Final    Comment: (NOTE) The Xpert Xpress SARS-CoV-2/FLU/RSV plus assay is intended as an aid in the diagnosis of influenza from Nasopharyngeal swab specimens and should not be used as a sole basis for treatment. Nasal washings and aspirates are unacceptable for Xpert Xpress SARS-CoV-2/FLU/RSV testing.  Fact Sheet for Patients: [employer]  Fact Sheet for Healthcare Providers: Serious[employer]ker[employer]it  This test is not yet approved or cleared by the [place] FDA and has been authorized for detection and/or diagnosis of SARS-CoV-2 by FDA under an Emergency Use Authorization (EUA). This EUA will remain in effect (meaning this test can be used) for the duration of the COVID-19 declaration under Section 564(b)(1) of the Act, 21 U.S.C. section 360bbb-3(b)(1),  unless the authorization is terminated or revoked.  Performed at [facility], [address], [place], [place] [address]       Studies: No results found.  Scheduled Meds: . carvedilol  12.5 mg Oral BID  . enoxaparin (LOVENOX) injection  40 mg Subcutaneous Q24H  . fluticasone  1 spray Each Nare BID  . folic acid  1 mg Oral Daily  . furosemide  40 mg Intravenous TID  . gabapentin  200 mg Oral TID  . methylPREDNISolone (SOLU-MEDROL) injection  40 mg Intravenous Q12H  . mometasone-formoterol  2 puff Inhalation BID  . multivitamin with minerals  1 tablet Oral Daily  . nicotine  21 mg Transdermal Daily  . pantoprazole  40 mg Oral Q1200  . thiamine  100 mg Oral Daily   Or  . thiamine  100 mg Intravenous Daily  . thiamine  100 mg Oral Daily    Continuous Infusions:    LOS: 4 days     Darlin Drop, MD [facility] Pager [phone]  If 7PM-7AM, please contact night-coverage [email or web address] Password [email or web address] 07/27/2020, 1:08 PM

## 2020-07-27 NOTE — Social Work (Signed)
Outpatient LCSW has contacted inpatient RNCM Heather that pt is active at [facility], sees Dr. Earlene Plater at [facility], and gets his medications at [facility] pharmacy. LCSW had scheduled visit at our office today, I have notified Joyce Gross w/ scheduling that pt is currently inpatient and will need to be rescheduled.  I remain available as needed to support with continuity of care in the outpatient setting.   Jesus Fuller, MSW, LCSW [facility] Heart/Vascular Care Navigation  [phone]

## 2020-07-28 ENCOUNTER — Other Ambulatory Visit (HOSPITAL_COMMUNITY): Payer: Self-pay | Admitting: Internal Medicine

## 2020-07-28 LAB — MAGNESIUM
Magnesium: 1.9 mg/dL (ref 1.7–2.4)
Magnesium: 1.6 mg/dL — ABNORMAL LOW (ref 1.7–2.4)

## 2020-07-28 LAB — BASIC METABOLIC PANEL
Anion gap: 14 (ref 5–15)
Anion gap: 11 (ref 5–15)
BUN: 19 mg/dL (ref 6–20)
BUN: 20 mg/dL (ref 6–20)
CO2: 38 mmol/L — ABNORMAL HIGH (ref 22–32)
CO2: 35 mmol/L — ABNORMAL HIGH (ref 22–32)
Calcium: 6.8 mg/dL — ABNORMAL LOW (ref 8.9–10.3)
Calcium: 6.7 mg/dL — ABNORMAL LOW (ref 8.9–10.3)
Chloride: 84 mmol/L — ABNORMAL LOW (ref 98–111)
Chloride: 86 mmol/L — ABNORMAL LOW (ref 98–111)
Creatinine, Ser: 1.13 mg/dL (ref 0.61–1.24)
Creatinine, Ser: 1.18 mg/dL (ref 0.61–1.24)
GFR, Estimated: >60 mL/min (ref >60)
GFR, Estimated: >60 mL/min (ref >60)
Glucose, Bld: 80 mg/dL (ref 70–99)
Glucose, Bld: 142 mg/dL — ABNORMAL HIGH (ref 70–99)
Potassium: 3.5 mmol/L (ref 3.5–5.1)
Potassium: 3.3 mmol/L — ABNORMAL LOW (ref 3.5–5.1)
Sodium: 136 mmol/L (ref 135–145)
Sodium: 132 mmol/L — ABNORMAL LOW (ref 135–145)

## 2020-07-28 LAB — PHOSPHORUS
Phosphorus: 1.1 mg/dL — ABNORMAL LOW (ref 2.5–4.6)
Phosphorus: 2.2 mg/dL — ABNORMAL LOW (ref 2.5–4.6)

## 2020-07-28 MED ORDER — POTASSIUM CHLORIDE CRYS ER 20 MEQ PO TBCR
40.0000 meq | EXTENDED_RELEASE_TABLET | Freq: Once | ORAL | Status: AC
  Administered 2020-07-28: 40 meq via ORAL
  Filled 2020-07-28: qty 2

## 2020-07-28 MED ORDER — PREDNISONE 20 MG PO TABS
40.0000 mg | ORAL_TABLET | Freq: Every day | ORAL | Status: DC
  Administered 2020-07-29: 40 mg via ORAL
  Filled 2020-07-28: qty 2

## 2020-07-28 MED ORDER — FLUTICASONE PROPIONATE 50 MCG/ACT NA SUSP: 1.0000 | Freq: Two times a day (BID) | NASAL | 2 refills | Status: DC

## 2020-07-28 MED ORDER — CALCIUM GLUCONATE-NACL 2-0.675 GM/100ML-% IV SOLN
2.0000 g | Freq: Once | INTRAVENOUS | Status: AC
  Administered 2020-07-28: 2000 mg via INTRAVENOUS
  Filled 2020-07-28: qty 100

## 2020-07-28 MED ORDER — NICOTINE 21 MG/24HR TD PT24: 21.0000 mg | MEDICATED_PATCH | Freq: Every day | TRANSDERMAL | 0 refills | Status: DC

## 2020-07-28 MED ORDER — GABAPENTIN 100 MG PO CAPS: 200.0000 mg | ORAL_CAPSULE | Freq: Three times a day (TID) | ORAL | 0 refills | Status: DC

## 2020-07-28 MED ORDER — PANTOPRAZOLE SODIUM 40 MG PO TBEC: 40.0000 mg | DELAYED_RELEASE_TABLET | Freq: Every day | ORAL | 0 refills | Status: DC

## 2020-07-28 MED ORDER — HYDROCORTISONE (PERIANAL) 2.5 % EX CREA: TOPICAL_CREAM | Freq: Two times a day (BID) | CUTANEOUS | 0 refills | Status: DC | PRN

## 2020-07-28 MED ORDER — POTASSIUM PHOSPHATES 15 MMOLE/5ML IV SOLN: 30.0000 mmol | Freq: Once | INTRAVENOUS | Status: DC

## 2020-07-28 MED ORDER — CALCIUM GLUCONATE-NACL 2-0.675 GM/100ML-% IV SOLN
2.0000 g | Freq: Once | INTRAVENOUS | Status: AC
  Administered 2020-07-29: 2000 mg via INTRAVENOUS
  Filled 2020-07-28: qty 100

## 2020-07-28 MED ORDER — CALCIUM CARBONATE 1250 (500 CA) MG PO TABS: 1.0000 | ORAL_TABLET | Freq: Three times a day (TID) | ORAL | 0 refills | Status: DC

## 2020-07-28 MED ORDER — PREDNISONE 5 MG PO TABS: 40.0000 mg | ORAL_TABLET | Freq: Every day | ORAL | 0 refills | Status: DC

## 2020-07-28 MED ORDER — ALBUTEROL SULFATE (2.5 MG/3ML) 0.083% IN NEBU: 2.5000 mg | INHALATION_SOLUTION | RESPIRATORY_TRACT | 0 refills | Status: DC | PRN

## 2020-07-28 MED ORDER — POTASSIUM PHOSPHATES 15 MMOLE/5ML IV SOLN
30.0000 mmol | Freq: Once | INTRAVENOUS | Status: AC
  Administered 2020-07-28: 30 mmol via INTRAVENOUS
  Filled 2020-07-28: qty 10

## 2020-07-28 MED ORDER — POTASSIUM & SODIUM PHOSPHATES 280-160-250 MG PO PACK
2.0000 | PACK | Freq: Three times a day (TID) | ORAL | Status: DC
  Administered 2020-07-28 – 2020-07-29 (×4): 2 via ORAL
  Filled 2020-07-28 (×5): qty 2

## 2020-07-28 MED ORDER — FOLIC ACID 1 MG PO TABS: 1.0000 mg | ORAL_TABLET | Freq: Every day | ORAL | 0 refills | Status: DC

## 2020-07-28 MED ORDER — ADULT MULTIVITAMIN W/MINERALS CH: 1.0000 | ORAL_TABLET | Freq: Every day | ORAL | 0 refills | Status: AC

## 2020-07-28 MED ORDER — THIAMINE HCL 100 MG PO TABS: 100.0000 mg | ORAL_TABLET | Freq: Every day | ORAL | 0 refills | Status: DC

## 2020-07-28 MED ORDER — CALCIUM CARBONATE 1250 (500 CA) MG PO TABS
500.0000 mg | ORAL_TABLET | Freq: Three times a day (TID) | ORAL | Status: DC
  Administered 2020-07-28 – 2020-07-29 (×3): 500 mg via ORAL
  Filled 2020-07-28 (×3): qty 1

## 2020-07-28 MED ORDER — CARVEDILOL 12.5 MG PO TABS: 12.5000 mg | ORAL_TABLET | Freq: Two times a day (BID) | ORAL | 0 refills | Status: DC

## 2020-07-28 MED ORDER — MAGNESIUM SULFATE 4 GM/100ML IV SOLN
4.0000 g | Freq: Once | INTRAVENOUS | Status: AC
  Administered 2020-07-28: 4 g via INTRAVENOUS
  Filled 2020-07-28: qty 100

## 2020-07-28 MED ORDER — ALBUTEROL SULFATE HFA 108 (90 BASE) MCG/ACT IN AERS: 2.0000 | INHALATION_SPRAY | Freq: Four times a day (QID) | RESPIRATORY_TRACT | 0 refills | Status: DC | PRN

## 2020-07-28 MED ORDER — MOMETASONE FURO-FORMOTEROL FUM 100-5 MCG/ACT IN AERO: 2.0000 | INHALATION_SPRAY | Freq: Two times a day (BID) | RESPIRATORY_TRACT | 1 refills | Status: DC

## 2020-07-28 MED ORDER — POTASSIUM CHLORIDE CRYS ER 20 MEQ PO TBCR: 40.0000 meq | EXTENDED_RELEASE_TABLET | Freq: Every day | ORAL | 0 refills | Status: DC

## 2020-07-28 MED ORDER — POTASSIUM & SODIUM PHOSPHATES 280-160-250 MG PO PACK: 2.0000 | PACK | Freq: Three times a day (TID) | ORAL | 0 refills | Status: DC

## 2020-07-28 MED FILL — DULERA 100 MCG/5 MCG INH: 100-5 | 28 days supply | Qty: 13 | Fill #0

## 2020-07-28 MED FILL — predniSONE 5 MG TABS: 5 | 15 days supply | Qty: 30 | Fill #0

## 2020-07-28 MED FILL — PANTOPRAZOLE SOD DR 40 MG T: 40 | 30 days supply | Qty: 30 | Fill #0

## 2020-07-28 MED FILL — FOLIC ACID 1 MG TABS: 1 | 30 days supply | Qty: 30 | Fill #0

## 2020-07-28 MED FILL — CALCIUM ANTACID 500 MG CHW: 500 | 7 days supply | Qty: 21 | Fill #0

## 2020-07-28 MED FILL — ALBUTEROL SULFATE HFA 108 (: 108 (90 BAS | 18 days supply | Qty: 18 | Fill #0

## 2020-07-28 MED FILL — VITAMIN B-1 100 MG TABS: 100 | 90 days supply | Qty: 90 | Fill #0

## 2020-07-28 MED FILL — GABAPENTIN 100 MG CAPSULE: 100 | 30 days supply | Qty: 180 | Fill #0

## 2020-07-28 MED FILL — FLUTICASONE PROP 50 MCG SPR: 50 | 28 days supply | Qty: 16 | Fill #0

## 2020-07-28 MED FILL — CERTAVITE/ANTIOXIDANTS TABS: 30 days supply | Qty: 30 | Fill #0

## 2020-07-28 MED FILL — PHOS-NAK PACKET: 280-160-250 | 7 days supply | Qty: 56 | Fill #0

## 2020-07-28 MED FILL — ALBUTEROL SUL 2.5 MG/3 ML S: (2.5 MG/3ML | 3 days supply | Qty: 90 | Fill #0

## 2020-07-28 MED FILL — PROCTOZONE-HC 2.5 % CREA: 2.5 | 10 days supply | Qty: 30 | Fill #0

## 2020-07-28 MED FILL — CARVEDILOL 12.5 MG TABLET: 12.5 | 30 days supply | Qty: 60 | Fill #0

## 2020-07-28 MED FILL — POTASSIUM CHLORIDE 20meqER: 20 | 30 days supply | Qty: 60 | Fill #0

## 2020-07-28 NOTE — Discharge Summary (Addendum)
Discharge Summary  Jesus BoronShaun M Fuller OZD:[id number] DOB: 1976-02-21  PCP: Arvilla MarketWallace, Catherine Lauren, DO  Admit date: 07/22/2020 Discharge date: 07/28/2020  Time spent: 35 minutes  Recommendations for Outpatient Follow-up:  1. Follow-up with pulmonary within a week 2. Follow-up with cardiology within a week 3. Follow-up with your primary care provider in 1 to 2 weeks 4. Take your medications as prescribed 5. Completely abstain from alcohol and tobacco use.  Discharge Diagnoses:  Active Hospital Problems   Diagnosis Date Noted  . Troponin level elevated   . Cough syncope   . COPD with acute exacerbation (HCC)   . Syncope and collapse 02/02/2020    Resolved Hospital Problems  No resolved problems to display.    Discharge Condition: Stable  Diet recommendation: Resume previous diet.  Vitals:   07/28/20 0755 07/28/20 1208  BP:  (!) 133/95  Pulse: 87 87  Resp: 18 18  Temp:  98.4 F (36.9 C)  SpO2: 96% 99%    History of present illness:  Jesus M Lovettis a 45 y.o.malewith medical history significant ofasthma, chronic cough lasting 5-6 months, ongoing tobacco use disorder, combined chronic systolic and diastolic CHF, NICM LVEF 35% in 2020, HTN, LLEdvt now offEliquis, who [facility] after having a recurrent syncopal episode at home.  History of recurrent syncopal episodes associated with cough, posttussive syncope. He wears a implantable loop recorder.  On the day of presentation while he was walking in kitchen he collapsed after coughing.  Associated with chest pain after his fall, thought he fractured his ribsso came to ED for further evaluation.    ED Course:Afebrile, hypotensive with blood pressure of 70/56 and mildly hypoxic requiring 2 L of oxygen.Labs positive for lactic acid of 4, troponin of 27, sodium of 128,chloride of 85, BUN 25, creatinine of 1.65 with baseline around 1,COVID-19 PCR negative,CTA was negative for PE or any other airspace opacity.  Cardiology was consulted from ED.  He received 1 dose of Zithromax and ceftriaxone along with 1 L bolus of normal saline.  Hospital course complicated by persistent cough for which pulmonary was consulted.  Was started on IV Solu-Medrol 40 mg twice daily and bronchodilators with improvement of his symptomatology.  Seen by cardiology, his implantable loop recorder was interrogated, no evidence of arrhythmia or sinus pauses.  Cardiology signed off, he would need to follow-up with his cardiologist outpatient.  Also complicated by volume overload for which he was diuresed with IV Lasix.  Net I&O -2.0 L.  Electrolytes derangement which are being repleted.  BMP ordered to be repeated outpatient at his PCPs office on 07/31/2020.  07/28/20: Patient was seen and examined at his bedside.  There were no acute events overnight.  He feels better today.  He has no complaints.  Discharge delayed due to electrolytes derangement which are being repleted intravenously.  We will repeat levels at 1800, follow results.  We will also obtain BMP, phosphorus and magnesium levels in the morning.  Can discharge to home in the morning once levels have normalized.   Hospital Course:  Active Problems:   Syncope and collapse   Troponin level elevated   Cough syncope   COPD with acute exacerbation (HCC)  Improving recurrent cough syncope with prior diagnosis of posttussive syncope Patient with diagnosis of post tussive syncope,think that he is having more episodes as he is having more cough for the past 5 months. Cardiology was consulted and they advised admission for observation so they can interrogate his implantable loop recorder,  no evidence of arrhythmia or sinus pauses. Echocardiogram revealed improved LVEF 60 to 65% from 30 to 35% previously. UA and UDS unremarkable Seen by cardiology, signed off.  No changes in his home cardiac medications.   Follow-up with cardiology within a week. Follow-up with pulmonary  within a week.  Volume overload in the setting of chronic combined systolic and diastolic CHF Received diuresing Net I&O -2.0 L. Resume home torsemide along with potassium replacement.  Upper airway cough syndrome/asthma exacerbation Seen by pulmonary Recommended IV Solu-Medrol 40 mg twice daily until bronchospasm subsides, switched to prednisone on 07/27/2020 Continue bronchodilator, Flonase and Protonix for GERD as recommended by pulmonary. Continue prednisone taper.  Chronic combined systolic and diastolic CHF Prior to admission he was on torsemide Received IV Lasix increased to 40 mg 3 times daily. Resume home torsemide with potassium replacement Continue home cardiac medications Follow-up with your cardiologist within a week.  Resolved post repletion: Refractory hypokalemia in the setting of diuretics.  Resolved refractory hypomagnesia in the setting of IV diuretics Magnesium 1.6> 1.4> 1.9 on 07/18/2020.  Hypocalcemia Serum calcium 6.5, corrected for albumin 7.6 Replete intravenously and orally Serum calcium 6.8, corrected for albumin 7.9. Continue oral replacement x7 days.  Severe hypophosphatemia Repleted intravenously and orally. Serum phosphorus 1.1 Continue oral replacement x7 days.  Likely OSA Patient will need home sleep study as outpatient Will need to follow-up with pulmonary  GERD Continue PPI per pulmonology, continue  Lactic acidosis. Patient appears little dry and he was hypoxic on arrival. He was afebrile and no leukocytosis. Received a dose of ceftriaxone and Zithromax in the ED, then was discontinued per admitting provider. Received IV fluid Lactic acid level down trended Procalcitonin 0.57, repeat 0.39  Elevated troponin. No evidence of acute ischemia on twelve-lead EKG Troponin peaked at 27 History of nonischemic cardiomyopathy Follow results of 2D echo 07/24/2020, normal LVEF 55 to 60%. Denies any anginal symptoms at the time  of this visit. Follow-up with cardiology within 1 week  Nonischemic cardiomyopathy. LV EF was improved on recent echo.  Was compliant with his diuretic and was taking torsemide  2D echo completed on 07/24/2020  Resolved hypervolemic hyponatremia Presented with serum sodium of 126>> 133> 136 Repeat BMP and follow-up with your PCP on 07/31/2020.  Nicotine dependence. Cessation counseling at bedside. -Nicotine patch.  Alcohol use disorder with concern for withdrawal No evidence of alcohol withdrawal at the time of this visit.   Code Status:Full code  Consults called:Cardiology, pulmonary.     Discharge Exam: BP (!) 133/95 (BP Location: Left Arm)   Pulse 87   Temp 98.4 F (36.9 C) (Oral)   Resp 18   Ht 5\' 9"  (1.753 m)   Wt 120.8 kg   SpO2 99%   BMI 39.33 kg/m  . General: 45 y.o. year-old male well developed well nourished in no acute distress.  Alert and oriented x3. . Cardiovascular: Regular rate and rhythm with no rubs or gallops.  No thyromegaly or JVD noted.   45 Respiratory: Clear to auscultation with no wheezes or rales. Good inspiratory effort. . Abdomen: Soft nontender nondistended with normal bowel sounds x4 quadrants. . Musculoskeletal: Trace lower extremity edema bilaterally.   Marland Kitchen Psychiatry: Mood is appropriate for condition and setting  Discharge Instructions You were cared for by a hospitalist during your hospital stay. If you have any questions about your discharge medications or the care you received while you were in the hospital after you are discharged, you can call the unit and  asked to speak with the hospitalist on call if the hospitalist that took care of you is not available. Once you are discharged, your primary care physician will handle any further medical issues. Please note that NO REFILLS for any discharge medications will be authorized once you are discharged, as it is imperative that you return to your primary care physician (or  establish a relationship with a primary care physician if you do not have one) for your aftercare needs so that they can reassess your need for medications and monitor your lab values.   Allergies as of 07/28/2020   No Known Allergies     Medication List    STOP taking these medications   aspirin EC 325 MG tablet   furosemide 40 MG tablet Commonly known as: LASIX     TAKE these medications   albuterol 108 (90 Base) MCG/ACT inhaler Commonly known as: VENTOLIN HFA Inhale 2 puffs into the lungs every 6 (six) hours as needed for wheezing or shortness of breath.   albuterol (2.5 MG/3ML) 0.083% nebulizer solution Commonly known as: PROVENTIL Take 3 mLs (2.5 mg total) by nebulization every 2 (two) hours as needed for wheezing.   calcium carbonate 1250 (500 Ca) MG tablet Commonly known as: OS-CAL - dosed in mg of elemental calcium Take 1 tablet (500 mg of elemental calcium total) by mouth 3 (three) times daily with meals for 7 days.   carvedilol 12.5 MG tablet Commonly known as: COREG Take 1 tablet (12.5 mg total) by mouth 2 (two) times daily.   fluticasone 50 MCG/ACT nasal spray Commonly known as: FLONASE Place 1 spray into both nostrils 2 (two) times daily.   folic acid 1 MG tablet Commonly known as: FOLVITE Take 1 tablet (1 mg total) by mouth daily.   gabapentin 100 MG capsule Commonly known as: NEURONTIN Take 2 capsules (200 mg total) by mouth 3 (three) times daily.   hydrocortisone 2.5 % rectal cream Commonly known as: ANUSOL-HC Place rectally 2 (two) times daily as needed for hemorrhoids.   mometasone-formoterol 100-5 MCG/ACT Aero Commonly known as: DULERA Inhale 2 puffs into the lungs 2 (two) times daily.   multivitamin with minerals Tabs tablet Take 1 tablet by mouth daily.   nicotine 21 mg/24hr patch Commonly known as: NICODERM CQ - dosed in mg/24 hours Place 1 patch (21 mg total) onto the skin daily.   pantoprazole 40 MG tablet Commonly known as:  PROTONIX Take 1 tablet (40 mg total) by mouth daily at 12 noon.   potassium & sodium phosphates 280-160-250 MG Pack Commonly known as: PHOS-NAK Take 2 packets by mouth 4 (four) times daily -  before meals and at bedtime for 7 days.   potassium chloride SA 20 MEQ tablet Commonly known as: KLOR-CON Take 2 tablets (40 mEq total) by mouth daily. Take 40 meq daily when you take Torsemide 20 mg daily. What changed:   how much to take  how to take this  when to take this  additional instructions   predniSONE 5 MG tablet Commonly known as: DELTASONE Take 8 tablets (40 mg total) by mouth daily with breakfast. Take 40 mg daily x 3 days, then, Take 30 mg daily x 3 days, then, Take 20 mg daily x 3 days, then, Take 10 mg daily x 3 days, then, Take 5 mg daily x 3 days, then stop.   thiamine 100 MG tablet Take 1 tablet (100 mg total) by mouth daily.   torsemide 20 MG tablet Commonly known  as: DEMADEX Take 1 tablet (20 mg total) by mouth daily.      No Known Allergies  Follow-up Information    Jodelle Red, MD. Schedule an appointment as soon as possible for a visit.   Specialty: Cardiology Contact information: [address] [place] [address] [phone]        [facility] Follow up.   Why: Dr Earlene Plater August 05, 2120 at 2:30 pm  Contact information: [address] [place] [address]       Oretha Milch, MD. Call in 1 day(s).   Specialty: Pulmonary Disease Why: Please call for a post hospital follow up appointment Contact information: [address] [place] [address] [phone]        Arvilla Market, DO .   Specialty: Family Medicine Contact information: [address] [place] [address] [phone]                The results of significant diagnostics from this hospitalization (including imaging, microbiology, ancillary and laboratory) are  listed below for reference.    Significant Diagnostic Studies: DG Chest 2 View  Result Date: 07/22/2020 CLINICAL DATA:  Syncope, left-sided rib pain, weakness and nausea. EXAM: CHEST - 2 VIEW COMPARISON:  02/02/2020 and CT chest 11/10/2019. FINDINGS: Trachea is midline. Heart size normal. A loop recorder projects over the left hemithorax. Lungs are clear. No pleural fluid. Old right rib and right clavicle fractures. IMPRESSION: No acute findings. Electronically Signed   By: Leanna Battles M.D.   On: 07/22/2020 13:56   CT Angio Chest PE W and/or Wo Contrast  Result Date: 07/22/2020 CLINICAL DATA:  Pain following fall EXAM: CT ANGIOGRAPHY CHEST WITH CONTRAST TECHNIQUE: Multidetector CT imaging of the chest was performed using the standard protocol during bolus administration of intravenous contrast. Multiplanar CT image reconstructions and MIPs were obtained to evaluate the vascular anatomy. CONTRAST:  80mL OMNIPAQUE IOHEXOL 350 MG/ML SOLN COMPARISON:  Chest radiograph July 22, 2020; chest CT angiogram November 10, 2019 FINDINGS: Cardiovascular: There is no demonstrable pulmonary embolus. There is no thoracic aortic aneurysm or dissection. Visualized great vessels appear normal. No pericardial effusion or pericardial thickening is evident. Mediastinum/Nodes: Visualized thyroid appears normal. There are scattered subcentimeter mediastinal lymph nodes. Previous prominent aortopulmonary window lymph node now has a short axis diameter of 5 mm, within normal limits. There is no frank adenopathy by size criteria. There is a small hiatal hernia with thickening in the wall of the distal third of the esophagus. Lungs/Pleura: No appreciable pneumothorax. Trachea and major bronchial structures appear patent. Lungs are clear. No evident pleural effusions. Upper Abdomen: There is hepatic steatosis. Visualized upper abdominal structures otherwise appear unremarkable. Musculoskeletal: Several healed rib fractures are noted on  the right. No acute appearing fracture is demonstrable. There are no blastic or lytic bone lesions. Several small Schmorl's nodes noted. No evident chest wall lesions. Review of the MIP images confirms the above findings. IMPRESSION: 1. No demonstrable pulmonary embolus. No thoracic aortic aneurysm or dissection. 2.  No edema or airspace opacity.  No pneumothorax. 3. Small hiatal hernia with distal esophageal wall thickening. Question chronic reflux esophagitis. 4.  No demonstrable adenopathy. 5.  Hepatic steatosis. Electronically Signed   By: Bretta Bang III M.D.   On: 07/22/2020 16:24   DG CHEST PORT 1 VIEW  Result Date: 07/22/2020 CLINICAL DATA:  Syncope EXAM: PORTABLE CHEST 1 VIEW COMPARISON:  07/22/2020 FINDINGS: The heart size and  mediastinal contours are within normal limits. Implanted cardiac loop recorder again identified. Both lungs are clear. The visualized skeletal structures are unremarkable. IMPRESSION: No active disease. Electronically Signed   By: Helyn Numbers MD   On: 07/22/2020 23:03   ECHOCARDIOGRAM COMPLETE  Result Date: 07/24/2020    ECHOCARDIOGRAM REPORT   Patient Name:   Jesus Fuller Date of Exam: 07/24/2020 Medical Rec #:  [id number]      Height:       69.0 in Accession #:    [id number]     Weight:       250.0 lb Date of Birth:  Jun 06, 1976      BSA:          2.272 m Patient Age:    44 years       BP:           118/74 mmHg Patient Gender: M              HR:           89 bpm. Exam Location:  Inpatient Procedure: 2D Echo Indications:    Syncope 780.2 / R55  History:        Patient has prior history of Echocardiogram examinations, most                 recent 02/03/2020. Signs/Symptoms:Syncope and Chest Pain; Risk                 Factors:Hypertension and Current Smoker. ETOH, NICM.  Sonographer:    Jeryl Columbia Referring Phys: [id number] LAURA R INGOLD IMPRESSIONS  1. Left ventricular ejection fraction, by estimation, is 55 to 60%. The left ventricle has normal function. Left ventricular  endocardial border not optimally defined to evaluate regional wall motion. Left ventricular diastolic parameters were normal.  2. Right ventricular systolic function is normal. The right ventricular size is normal. Tricuspid regurgitation signal is inadequate for assessing PA pressure.  3. The mitral valve was not well visualized. No evidence of mitral valve regurgitation.  4. The aortic valve was not well visualized. Aortic valve regurgitation is not visualized.  5. Technically difficult study with very poor acoustic windows. FINDINGS  Left Ventricle: Left ventricular ejection fraction, by estimation, is 55 to 60%. The left ventricle has normal function. Left ventricular endocardial border not optimally defined to evaluate regional wall motion. Definity contrast agent was given IV to delineate the left ventricular endocardial borders. The left ventricular internal cavity size was normal in size. There is no left ventricular hypertrophy. Left ventricular diastolic parameters were normal. Right Ventricle: The right ventricular size is normal. Right vetricular wall thickness was not well visualized. Right ventricular systolic function is normal. Tricuspid regurgitation signal is inadequate for assessing PA pressure. Left Atrium: Left atrial size was not well visualized. Right Atrium: Right atrial size was not well visualized. Pericardium: There is no evidence of pericardial effusion. Mitral Valve: The mitral valve was not well visualized. No evidence of mitral valve regurgitation. Tricuspid Valve: The tricuspid valve is not well visualized. Tricuspid valve regurgitation is not demonstrated. Aortic Valve: The aortic valve was not well visualized. Aortic valve regurgitation is not visualized. Pulmonic Valve: The pulmonic valve was not well visualized. Pulmonic valve regurgitation is not visualized. Aorta: The aortic root is normal in size and structure. Venous: The inferior vena cava was not well visualized. IAS/Shunts:  The interatrial septum was not well visualized.  LEFT VENTRICLE PLAX 2D LVIDd:         4.60 cm  Diastology LVIDs:         3.10 cm  LV e' medial:    11.60 cm/s LV PW:         1.00 cm  LV E/e' medial:  6.6 LV IVS:        1.00 cm  LV e' lateral:   14.30 cm/s LVOT diam:     2.10 cm  LV E/e' lateral: 5.3 LVOT Area:     3.46 cm  LEFT ATRIUM         Index LA diam:    2.40 cm 1.06 cm/m   AORTA Ao Root diam: 2.40 cm MITRAL VALVE MV Area (PHT): 3.77 cm    SHUNTS MV Decel Time: 201 msec    Systemic Diam: 2.10 cm MV E velocity: 76.30 cm/s MV A velocity: 30.40 cm/s MV E/A ratio:  2.51 Marca Ancona MD Electronically signed by Marca Ancona MD Signature Date/Time: 07/24/2020/4:11:09 PM    Final     Microbiology: Recent Results (from the past 240 hour(s))  Resp Panel by RT-PCR (Flu A&B, Covid) Nasopharyngeal Swab     Status: None   Collection Time: 07/22/20  3:17 PM   Specimen: Nasopharyngeal Swab; Nasopharyngeal(NP) swabs in vial transport medium  Result Value Ref Range Status   SARS Coronavirus 2 by RT PCR NEGATIVE NEGATIVE Final    Comment: (NOTE) SARS-CoV-2 target nucleic acids are NOT DETECTED.  The SARS-CoV-2 RNA is generally detectable in upper respiratory specimens during the acute phase of infection. The lowest concentration of SARS-CoV-2 viral copies this assay can detect is 138 copies/mL. A negative result does not preclude SARS-Cov-2 infection and should not be used as the sole basis for treatment or other patient management decisions. A negative result may occur with  improper specimen collection/handling, submission of specimen other than nasopharyngeal swab, presence of viral mutation(s) within the areas targeted by this assay, and inadequate number of viral copies(<138 copies/mL). A negative result must be combined with clinical observations, patient history, and epidemiological information. The expected result is Negative.  Fact Sheet for Patients:   BloggerCourse.com  Fact Sheet for Healthcare Providers:  SeriousBroker.it  This test is no t yet approved or cleared by the [place] FDA and  has been authorized for detection and/or diagnosis of SARS-CoV-2 by FDA under an Emergency Use Authorization (EUA). This EUA will remain  in effect (meaning this test can be used) for the duration of the COVID-19 declaration under Section 564(b)(1) of the Act, 21 U.S.C.section 360bbb-3(b)(1), unless the authorization is terminated  or revoked sooner.       Influenza A by PCR NEGATIVE NEGATIVE Final   Influenza B by PCR NEGATIVE NEGATIVE Final    Comment: (NOTE) The Xpert Xpress SARS-CoV-2/FLU/RSV plus assay is intended as an aid in the diagnosis of influenza from Nasopharyngeal swab specimens and should not be used as a sole basis for treatment. Nasal washings and aspirates are unacceptable for Xpert Xpress SARS-CoV-2/FLU/RSV testing.  Fact Sheet for Patients: BloggerCourse.com  Fact Sheet for Healthcare Providers: SeriousBroker.it  This test is not yet approved or cleared by the [place] FDA and has been authorized for detection and/or diagnosis of SARS-CoV-2 by FDA under an Emergency Use Authorization (EUA). This EUA will remain in effect (meaning this test can be used) for the duration of the COVID-19 declaration under Section 564(b)(1) of the Act, 21 U.S.C. section 360bbb-3(b)(1), unless the authorization is terminated or revoked.  Performed at [facility], [address] [place], [place] [address]  Labs: Basic Metabolic Panel: Recent Labs  Lab 07/24/20 0543 07/24/20 1352 07/25/20 0228 07/26/20 0412 07/27/20 0754 07/28/20 0652  NA 132*  --  133* 133* 133* 136  K 4.0  --  3.7 3.3* 3.7 3.5  CL 87*  --  90* 87* 89* 84*  CO2 31  --  29 32 31 38*  GLUCOSE 101*  --  129* 112* 161* 80  BUN 17  --  14 16  18 19   CREATININE 1.21  --  1.16 1.25* 1.23 1.13  CALCIUM 7.9*  --  7.7* 7.4* 6.5* 6.8*  MG 1.9 1.7  --  1.6* 1.4* 1.9  PHOS  --  <1.0* 2.4* 1.8* 2.1* 1.1*   Liver Function Tests: Recent Labs  Lab 07/23/20 0410  AST 108*  ALT 71*  ALKPHOS 142*  BILITOT 2.3*  PROT 5.5*  ALBUMIN 2.6*   No results for input(s): LIPASE, AMYLASE in the last 168 hours. No results for input(s): AMMONIA in the last 168 hours. CBC: Recent Labs  Lab 07/22/20 1326 07/22/20 1611 07/22/20 2256 07/24/20 0609 07/24/20 0929  WBC 6.2  --  5.9 11.5* 10.2  NEUTROABS  --   --   --   --  9.3*  HGB 15.8 16.3 15.1 15.2 14.9  HCT 42.4 48.0 43.4 42.2 41.8  MCV 105.7*  --  108.2* 109.6* 110.6*  PLT 169  --  147* 134* 124*   Cardiac Enzymes: No results for input(s): CKTOTAL, CKMB, CKMBINDEX, TROPONINI in the last 168 hours. BNP: BNP (last 3 results) Recent Labs    02/02/20 1732 07/22/20 1821 07/22/20 2256  BNP 30.5 29.4 35.8    ProBNP (last 3 results) Recent Labs    05/25/20 1138  PROBNP 409*    CBG: No results for input(s): GLUCAP in the last 168 hours.     Signed:  13/08/21, MD [facility] 07/28/2020, 1:21 PM

## 2020-07-28 NOTE — Social Work (Signed)
Outpatient Heart/Vascular LCSW aware pt will discharge today and has been assisted by [facility] team w/ medications through [facility] pharmacy. CSW will f/u with pt when discharged to remind him of upcoming appointments and see if we can connect him again with assistance applications (Family [employer] Medicaid only at this time; no updates from previous CAFA applications). Appreciate updates from inpatient [facility] team.   Octavio Graves, MSW, LCSW [facility] Heart/Vascular Care Navigation  [phone]

## 2020-07-28 NOTE — Progress Notes (Signed)
Physical Therapy Treatment and discharge Patient Details Name: Jesus Fuller MRN: [id number] DOB: 30-Sep-1975 Today's Date: 07/28/2020    History of Present Illness 45yo male presenting after syncopal episode at home, hx of being diagnosed with post-tussive syncope. PMH ETOH abuse, CHF, HTN, hx DVT, NICM, tobacco abuse, loop recorder placement    PT Comments    Pt progressing well towards goals. Overall requiring supervision for gait and supervision to min guard for stair navigation for safety only. Pt mildly impulsive, but feel this is likely baseline for patient. Educated about energy conservation and walking program to perform at home. Pt plans to d/c home today. No further skilled PT needs at this time. Will sign off. If needs change, please re-consult.   Follow Up Recommendations  No PT follow up     Equipment Recommendations  None recommended by PT    Recommendations for Other Services       Precautions / Restrictions Precautions Precautions: Fall Restrictions Weight Bearing Restrictions: No    Mobility  Bed Mobility Overal bed mobility: Independent                Transfers Overall transfer level: Independent                  Ambulation/Gait Ambulation/Gait assistance: Supervision Gait Distance (Feet): 150 Feet Assistive device: None Gait Pattern/deviations: Step-through pattern;Decreased stride length;Antalgic Gait velocity: Decreased   General Gait Details: Mildly antalgic gait secondary to BLE pain (RLE>LLE). One  mild LOB noted, however, pt able to self recover. overall at a supervision level for safety.   Stairs Stairs: Yes Stairs assistance: Supervision;Min guard Stair Management: Alternating pattern;One rail Right;Forwards Number of Stairs: 10 General stair comments: Overall steady stair navigation. No LOB noted. Educated about using step to pattern if pt with increased pain.   Wheelchair Mobility    Modified Rankin (Stroke Patients  Only)       Balance Overall balance assessment: Mild deficits observed, not formally tested                                          Cognition Arousal/Alertness: Awake/alert Behavior During Therapy: Impulsive Overall Cognitive Status: Within Functional Limits for tasks assessed                                 General Comments: Pt impulsive during session, but likely close to baseline.      Exercises      General Comments        Pertinent Vitals/Pain Pain Assessment: Faces Faces Pain Scale: Hurts little more Pain Location: BLE (RLE>LLE) Pain Descriptors / Indicators: Shooting Pain Intervention(s): Limited activity within patient's tolerance;Monitored during session;Repositioned    Home Living                      Prior Function            PT Goals (current goals can now be found in the care plan section) Acute Rehab PT Goals Patient Stated Goal: go home, feel better PT Goal Formulation: With patient Time For Goal Achievement: 07/28/20 Potential to Achieve Goals: Good Progress towards PT goals: Goals met/education completed, patient discharged from PT    Frequency    Min 2X/week      PT Plan Current plan remains appropriate  Co-evaluation              AM-PAC PT "6 Clicks" Mobility   Outcome Measure  Help needed turning from your back to your side while in a flat bed without using bedrails?: None Help needed moving from lying on your back to sitting on the side of a flat bed without using bedrails?: None Help needed moving to and from a bed to a chair (including a wheelchair)?: None Help needed standing up from a chair using your arms (e.g., wheelchair or bedside chair)?: None Help needed to walk in hospital room?: None Help needed climbing 3-5 steps with a railing? : A Little 6 Click Score: 23    End of Session   Activity Tolerance: Patient tolerated treatment well Patient left: in chair;with call  bell/phone within reach;with nursing/sitter in room (nursing in room going over d/c papers) Nurse Communication: Mobility status PT Visit Diagnosis: History of falling (Z91.81);Other abnormalities of gait and mobility (R26.89)     Time: 1282-0813 PT Time Calculation (min) (ACUTE ONLY): 10 min  Charges:  $Gait Training: 8-22 mins                     Lou Miner, DPT  Acute Rehabilitation Services  Pager: [phone] Office: [phone]    Rudean Hitt 07/28/2020, 1:21 PM

## 2020-07-28 NOTE — TOC Progression Note (Addendum)
Transition of Care [facility]) - Progression Note    Patient Details  Name: Jesus Fuller MRN: [id number] Date of Birth: January 30, 1976  [facility] CM/SW Contact  Nadene Rubins Adria Devon, RN Phone Number: 07/28/2020, 10:24 AM  Clinical Narrative:     Prescriptions sent to [facility]. Patient [facility] in [facility] with no co pays. Follow up appointment on AVS.  CHF clinic aware he is discharging today.   Patient's mom will transport home.   Patient has NEB machine at home.   Expected Discharge Plan: Home/Self Care Barriers to Discharge: Continued Medical Work up  Expected Discharge Plan and Services Expected Discharge Plan: Home/Self Care   Discharge Planning Services: CM Consult   Living arrangements for the past 2 months: Apartment                 DME Arranged: N/A DME Agency: NA       HH Arranged: NA           Social Determinants of Health (SDOH) Interventions    Readmission Risk Interventions No flowsheet data found.

## 2020-07-28 NOTE — Progress Notes (Signed)
   NAME:  Jesus Fuller, MRN:  [id number], DOB:  07-Jan-1976, LOS: 5 ADMISSION DATE:  07/22/2020, CONSULTATION DATE:  07/24/20 REFERRING MD:  Margo Aye - TRH, CHIEF COMPLAINT:  Cough with syncope  Brief History:  45 yo M smoker with recurrent episodes of cough syncope for the past 6 months Loop recorder did not show any arrhythmias  History of Present Illness:  45 yo M PMH Asthma, chronic cough, tobacco use disorder, systolic and diastolic heart failure, NICM, prior DVT, who presented to ED 1/5 following syncopal episode in which the patient fell. Syncopal episode associated with coughing, which has been ongoing problem for patient. Cardiology was consulted for evaluation of possible cardiac etiology of possttussive syncope, including device interrogation of implanted loop recorder, but have not been revealing.  Noted by EP that patient reports frequent rescue inhaler use, and is wheezing on exam.   Pulm consult for asthma management.  Patient reports a few coughing + syncopal episodes "growing up," but starting occurring regularly approx 1 year ago, including a syncopal episode that occurred while driving. Approx 6 months ago, these events increased in frequency again following a viral-type URI sx. Reports worsening heartburn-like symptoms and inability to afford OTC medications other than Tums for this. Coughing is worse at night when pt is flat -- sometimes pt feels post-nasal drip and often notices reflux. Endorses intermittent seasonal allergies but does not take medication for allergies. Is prescribed albuterol by Cardiology. Endorses ongoing tobacco use -- started smoking at age 40. 3 months ago, has cut back from 2.5 ppd to 1 ppd and is trying to quit.   Past Medical History:  Systolic and diastolic HF HTN LLE DVT NICM Tobacco abuse Asthma  Hepatic steatosis  Significant Hospital Events:   1/5 admitted to [facility]. Cardiology consulted  1/6 Cards EP consulted.  1/7 PCCM consulted for asthma    Consults:  Cards Cards EP PCCM  Procedures:    Significant Diagnostic Tests:  CTA chest> no PE. No pulm edema or ASD. Distal esophageal wall thickening. Small hiatal hernia. Hepatic steatosis  Micro Data:  1/5 COVID negative   Antimicrobials:    Interim History / Subjective:   Cough and feels a little bit better Shortness of breath feels a little bit better Has occasional wheezing  Objective   Blood pressure (!) 133/95, pulse 87, temperature 98.4 F (36.9 C), temperature source Oral, resp. rate 18, height 5\' 9"  (1.753 m), weight 120.8 kg, SpO2 99 %.        Intake/Output Summary (Last 24 hours) at 07/28/2020 1611 Last data filed at 07/28/2020 1527 Gross per 24 hour  Intake 460 ml  Output 1700 ml  Net -1240 ml   Filed Weights   07/26/20 1027  Weight: 120.8 kg    Examination: Gen. occasional coughs ENT -moist oral mucosa Neck: No JVD no thyromegaly, no adenopathy  Lungs: Wheezing noted at the bases of the lungs Cardiovascular: S1-S2 appreciated Musculoskeletal: No deformities, no cyanosis or clubbing , no tremors   Resolved Hospital Problem list     Assessment & Plan:   Chronic cough -Multifactorial -Reflux, allergies, postnasal drip  Syncope -No arrhythmias on monitor  Childhood asthma -Continue bronchodilators  Long-term smoker -Further work-up as outpatient Possibility of obstructive lung disease   Continue Dulera Continue other bronchodilators  Scheduled to see Dr.Alva as outpatient  Stable for discharge from a pulmonary perspective  Will sign off  09/23/20, MD [place] [facility] Pager: [phone]

## 2020-07-28 NOTE — Discharge Instructions (Signed)
Hypokalemia Hypokalemia means that the amount of potassium in the blood is lower than normal. Potassium is a chemical (electrolyte) that helps regulate the amount of fluid in the body. It also stimulates muscle tightening (contraction) and helps nerves work properly. Normally, most of the body's potassium is inside cells, and only a very small amount is in the blood. Because the amount in the blood is so small, minor changes to potassium levels in the blood can be life-threatening. What are the causes? This condition may be caused by:  Antibiotic medicine.  Diarrhea or vomiting. Taking too much of a medicine that helps you have a bowel movement (laxative) can cause diarrhea and lead to hypokalemia.  Chronic kidney disease (CKD).  Medicines that help the body get rid of excess fluid (diuretics).  Eating disorders, such as bulimia.  Low magnesium levels in the body.  Sweating a lot. What are the signs or symptoms? Symptoms of this condition include:  Weakness.  Constipation.  Fatigue.  Muscle cramps.  Mental confusion.  Skipped heartbeats or irregular heartbeat (palpitations).  Tingling or numbness. How is this diagnosed? This condition is diagnosed with a blood test. How is this treated? This condition may be treated by:  Taking potassium supplements by mouth.  Adjusting the medicines that you take.  Eating more foods that contain a lot of potassium. If your potassium level is very low, you may need to get potassium through an IV and be monitored in the hospital. Follow these instructions at home:  Take over-the-counter and prescription medicines only as told by your health care provider. This includes vitamins and supplements.  Eat a healthy diet. A healthy diet includes fresh fruits and vegetables, whole grains, healthy fats, and lean proteins.  If instructed, eat more foods that contain a lot of potassium. This includes: ? Nuts, such as peanuts and  pistachios. ? Seeds, such as sunflower seeds and pumpkin seeds. ? Peas, lentils, and lima beans. ? Whole grain and bran cereals and breads. ? Fresh fruits and vegetables, such as apricots, avocado, bananas, cantaloupe, kiwi, oranges, tomatoes, asparagus, and potatoes. ? Orange juice. ? Tomato juice. ? Red meats. ? Yogurt.  Keep all follow-up visits as told by your health care provider. This is important.   Contact a health care provider if you:  Have weakness that gets worse.  Feel your heart pounding or racing.  Vomit.  Have diarrhea.  Have diabetes (diabetes mellitus) and you have trouble keeping your blood sugar (glucose) in your target range. Get help right away if you:  Have chest pain.  Have shortness of breath.  Have vomiting or diarrhea that lasts for more than 2 days.  Faint. Summary  Hypokalemia means that the amount of potassium in the blood is lower than normal.  This condition is diagnosed with a blood test.  Hypokalemia may be treated by taking potassium supplements, adjusting the medicines that you take, or eating more foods that are high in potassium.  If your potassium level is very low, you may need to get potassium through an IV and be monitored in the hospital. This information is not intended to replace advice given to you by your health care provider. Make sure you discuss any questions you have with your health care provider. Document Revised: 02/14/2018 Document Reviewed: 02/14/2018 Elsevier Patient Education  2021 Elsevier Inc.   Syncope Syncope is when you pass out (faint) for a short time. It is caused by a sudden decrease in blood flow to the brain.  Signs that you may be about to pass out include:  Feeling dizzy or light-headed.  Feeling sick to your stomach (nauseous).  Seeing all white or all black.  Having cold, clammy skin. If you pass out, get help right away. Call your local emergency services (911 in the [place] Do not drive  yourself to the hospital. Follow these instructions at home: Watch for any changes in your symptoms. Take these actions to stay safe and help with your symptoms: Lifestyle  Do not drive, use machinery, or play sports until your doctor says it is okay.  Do not drink alcohol.  Do not use any products that contain nicotine or tobacco, such as cigarettes and e-cigarettes. If you need help quitting, ask your doctor.  Drink enough fluid to keep your pee (urine) pale yellow. General instructions  Take over-the-counter and prescription medicines only as told by your doctor.  If you are taking blood pressure or heart medicine, sit up and stand up slowly. Spend a few minutes getting ready to sit and then stand. This can help you feel less dizzy.  Have someone stay with you until you feel stable.  If you start to feel like you might pass out, lie down right away and raise (elevate) your feet above the level of your heart. Breathe deeply and steadily. Wait until all of the symptoms are gone.  Keep all follow-up visits as told by your doctor. This is important. Get help right away if:  You have a very bad headache.  You pass out once or more than once.  You have pain in your chest, belly, or back.  You have a very fast or uneven heartbeat (palpitations).  It hurts to breathe.  You are bleeding from your mouth or your bottom (rectum).  You have black or tarry poop (stool).  You have jerky movements that you cannot control (seizure).  You are confused.  You have trouble walking.  You are very weak.  You have vision problems. These symptoms may be an emergency. Do not wait to see if the symptoms will go away. Get medical help right away. Call your local emergency services (911 in the [place] Do not drive yourself to the hospital. Summary  Syncope is when you pass out (faint) for a short time. It is caused by a sudden decrease in blood flow to the brain.  Signs that you may be  about to faint include feeling dizzy, light-headed, or sick to your stomach, seeing all white or all black, or having cold, clammy skin.  If you start to feel like you might pass out, lie down right away and raise (elevate) your feet above the level of your heart. Breathe deeply and steadily. Wait until all of the symptoms are gone. This information is not intended to replace advice given to you by your health care provider. Make sure you discuss any questions you have with your health care provider. Document Revised: 08/14/2019 Document Reviewed: 08/16/2017 Elsevier Patient Education  2021 Elsevier Inc.  Asthma Action Plan, Adult An asthma action plan helps you understand how to manage your asthma and what to do when you have an asthma attack. The action plan is a color-coded plan that lists the symptoms that indicate whether or not your condition is under control and what actions to take.  If you have symptoms in the green zone, it means you are doing well.  If you have symptoms in the yellow zone, it means you are having problems.  If you have symptoms in the red zone, you need medical care right away. Follow the plan that you and your health care provider develop. Review your plan with your health care provider at each visit. What triggers your asthma? Knowing the things that can trigger an asthma attack or make your asthma symptoms worse is very important. Talk to your health care provider about your asthma triggers and how to avoid them. Record your known asthma triggers here: _______________ What is your personal best peak flow reading? If you use a peak flow meter, determine your personal best reading. Record it here: _______________ Red zone Symptoms in this zone mean that you should get medical help right away. You will likely feel distressed and have symptoms at rest that restrict your activity. You are in the red zone if:  You are breathing hard and quickly.  Your nose opens  wide, your ribs show, and your neck muscles become visible when you breathe in.  Your lips, fingers, or toes are a bluish color.  You have trouble speaking in full sentences.  Your peak flow reading is less than __________ (less than 50% of your personal best).  Your symptoms do not improve within 15-20 minutes after you use your reliever or rescue medicine (bronchodilator). If you have any of these symptoms:  Call your local emergency services (911 in the [place] or go to the nearest emergency room.  Use your reliever or rescue medicine. ? Start a nebulizer treatment or take 2-4 puffs from a metered-dose inhaler with a spacer. ? Repeat this action every 15-20 minutes until help arrives.   Yellow zone Symptoms in this zone mean that your condition may be getting worse. You may have symptoms that interfere with exercise, are noticeably worse after exposure to triggers, or are worse at the first sign of a cold (upper respiratory infection). These may include:  Waking from sleep.  Coughing, especially at night or first thing in the morning.  Mild wheezing.  Chest tightness.  A peak flow reading that is __________ to __________ (50-79% of your personal best). If you have any of these symptoms:  Add the following medicine to the ones that you use daily: ? Reliever or rescue medicine and dosage: _______________ ? Additional medicine and dosage: _______________ Call your health care provider if:  You remain in the yellow zone for __________ hours.  You are using a reliever or rescue medicine more than 2-3 times a week.   Green zone This zone means that your asthma is under control. You may not have any symptoms while you are in the green zone. This means that you:  Have no coughing or wheezing, even while you are working or playing.  Sleep through the night.  Are breathing well.  Have a peak flow reading that is above __________ (80% of your personal best or greater). If you  are in the green zone, continue to manage your asthma as directed:  Take these medicines every day: ? Controller medicine and dosage: _______________ ? Controller medicine and dosage: _______________ ? Controller medicine and dosage: _______________ ? Controller medicine and dosage: _______________  Before exercise, use this reliever or rescue medicine: _______________ Call your health care provider if you are using a reliever or rescue medicine more than 2-3 times a week.   Where to find more information You can find more information about asthma from:  Centers for Disease Control and Prevention: SendThoughts.com.pt  American Lung Association: www.lung.org This information is not intended to  replace advice given to you by your health care provider. Make sure you discuss any questions you have with your health care provider. Document Revised: 10/29/2019 Document Reviewed: 10/29/2019 Elsevier Patient Education  2021 Elsevier Inc.  Asthma Attack  Asthma attack, also called acute bronchospasm, is the sudden narrowing and tightening of the air passages, which limits the amount of oxygen that can get into the lungs. The narrowing is caused by inflammation and tightening of the muscles in the air tubes (bronchi) in the lungs. Too much mucus is also produced, which narrows the airways more. This can cause trouble breathing, loud breathing (wheezing), and coughing. The goal of treatment is to open the airways in the lungs and reduce inflammation. What are the causes? Possible causes or triggers of this condition include:  Animal dander, dust mites, or cockroaches.  Mold, pollen from trees or grass, or cold air.  Air pollutants such as dust, household cleaners, aerosol sprays, strong chemicals, strong odors, and smoke of any kind.  Stress or strong emotions such as crying or laughing hard.  Exercise or activity that requires a lot of energy.  Substances in foods and drinks, such as dried  fruits and wine, called sulfites.  Certain medicines or medical conditions such as: ? Aspirin or beta-blockers. ? Infections or inflammatory conditions, such as a flu (influenza), a cold, pneumonia, or inflammation of the nasal membranes (rhinitis). ? Gastroesophageal reflux disease (GERD). GERD is a condition in which stomach acid backs up into your esophagus and spills into your trachea (windpipe), which can irritate your airways. What are the signs or symptoms? Symptoms of this condition include:  Wheezing. This may sound like whistling while breathing. This may only happen at night.  Excessive coughing. This may only happen at night.  Chest tightness or pain.  Shortness of breath.  Feeling like you cannot get enough air no matter how hard you breathe (air hunger). How is this diagnosed? This condition may be diagnosed based on:  Your medical history.  Your symptoms.  A physical exam.  Tests to check for other causes of your symptoms or other conditions that may have triggered your asthma attack. These tests may include: ? A chest X-ray. ? Blood tests. ? Tests to assess lung function, such as breathing into a device that measures how much air you can inhale and exhale (spirometry). How is this treated? Treatment for this condition depends on the severity and cause of your asthma attack.  For mild attacks, you may receive medicines through a hand-held inhaler (metered dose inhaler, or MDI) or through a device that turns liquid medicine into a mist (nebulizer). These medicines include: ? Quick relief or rescue medicines that quickly relax the airways and lungs. ? Long-acting medicines that are used daily to prevent (control) your asthma symptoms.  For moderate or severe attacks, you may be treated with steroid medicines by mouth or through an IV injection at the hospital.  For severe attacks, you may need oxygen therapy or a breathing machine (ventilator).  If your asthma  attack was caused by an infection from bacteria, you will be given antibiotic medicines. Follow these instructions at home: Medicines  Take over-the-counter and prescription medicines only as told by your health care provider. ? Keep your medicines up-to-date. ? Make sure you have all of your medicines available at all times.  If you were prescribed an antibiotic medicine, take it as told by your health care provider. Do not stop taking the antibiotic even if you  start to feel better.  Tell your doctor if you may be pregnant to make sure your asthma medicine is safe to use during pregnancy. Avoiding triggers  Keep track of things that trigger your asthma attacks. Avoid exposure to these triggers.  Do not use any products that contain nicotine or tobacco, such as cigarettes, e-cigarettes, and chewing tobacco. If you need help quitting, ask your health care provider.  When there is a lot of pollen, air pollution, or humidity, keep windows closed and use an air conditioner or go to places with air conditioning.   Asthma action plan  Work with your health care provider to make a written plan for managing and treating your asthma attacks (asthma action plan). This plan should include: ? A list of your asthma triggers and how to avoid them. ? A list of symptoms that you may have during an asthma attack. ? Information about which medicine to take, when to take the medicine and how much of the medicine to take. ? Information to help you understand your peak flow measurements. ? Daily actions that you can take to control your asthma symptoms. ? Contact information for your health care providers.  If you have an asthma attack, act quickly. Follow the emergency steps on your written asthma action plan. This may prevent you from needing to go to the hospital. General instructions  Avoid excessive exercise or activity until your asthma attack goes away. Ask your health care provider what activities  are safe for you and when you can return to your normal activities.  Stay up to date on all your vaccines, such as flu and pneumonia vaccines.  Drink enough fluid to keep your urine pale yellow. Staying hydrated helps keep mucus in your lungs thin so it can be coughed up easily.  Do not use alcohol until you have recovered.  Keep all follow-up visits as told by your health care provider. This is important. Asthma requires careful medical care. Contact a health care provider if:  You have followed your action plan for 1 hour and your peak flow reading is still at 50-79%. This is in the yellow zone, which means "caution."  You need to use your quick reliever medicine more frequently than normal.  Your medicines are causing side effects, such as rash, itching, swelling, or trouble breathing.  Your symptoms do not improve after 48 hours.  You cough up mucus that is thicker than usual.  You have a fever. Get help right away if:  Your peak flow reading is less than 50% of your personal best. This is in the red zone, which means "danger."  You have trouble breathing.  You develop chest pain or discomfort.  Your medicines no longer seem to be helping.  You are coughing up bloody mucus.  You have a fever and your symptoms suddenly get worse.  You have trouble swallowing.  You feel very tired, and breathing becomes tiring. These symptoms may represent a serious problem that is an emergency. Do not wait to see if the symptoms will go away. Get medical help right away. Call your local emergency services (911 in the [place] Do not drive yourself to the hospital. Summary  Asthma attacks are caused by narrowing or tightness in air passages, which causes shortness of breath, coughing, and loud breathing (wheezing).  Many things can trigger an asthma attack, such as allergens, weather changes, exercise, strong odors, and smoke of any kind.  If you have an asthma attack, act quickly.  Follow the emergency steps on your written asthma action plan.  Get help right away if you have severe trouble breathing, chest pain, or fever, or if your home medicines are no longer helping with your symptoms. This information is not intended to replace advice given to you by your health care provider. Make sure you discuss any questions you have with your health care provider. Document Revised: 07/02/2019 Document Reviewed: 07/02/2019 Elsevier Patient Education  2021 [employer].

## 2020-07-29 ENCOUNTER — Other Ambulatory Visit: Payer: Self-pay

## 2020-07-29 DIAGNOSIS — G4733 Obstructive sleep apnea (adult) (pediatric): Secondary | ICD-10-CM

## 2020-07-29 LAB — COMPREHENSIVE METABOLIC PANEL
ALT: 92 U/L — ABNORMAL HIGH (ref 0–44)
AST: 80 U/L — ABNORMAL HIGH (ref 15–41)
Albumin: 2.8 g/dL — ABNORMAL LOW (ref 3.5–5.0)
Alkaline Phosphatase: 88 U/L (ref 38–126)
Anion gap: 13 (ref 5–15)
BUN: 17 mg/dL (ref 6–20)
CO2: 34 mmol/L — ABNORMAL HIGH (ref 22–32)
Calcium: 7 mg/dL — ABNORMAL LOW (ref 8.9–10.3)
Chloride: 88 mmol/L — ABNORMAL LOW (ref 98–111)
Creatinine, Ser: 1.04 mg/dL (ref 0.61–1.24)
GFR, Estimated: >60 mL/min (ref >60)
Glucose, Bld: 90 mg/dL (ref 70–99)
Potassium: 3.5 mmol/L (ref 3.5–5.1)
Sodium: 135 mmol/L (ref 135–145)
Total Bilirubin: 1 mg/dL (ref 0.3–1.2)
Total Protein: 5.5 g/dL — ABNORMAL LOW (ref 6.5–8.1)

## 2020-07-29 LAB — MAGNESIUM: Magnesium: 2.2 mg/dL (ref 1.7–2.4)

## 2020-07-29 LAB — PHOSPHORUS: Phosphorus: 3.1 mg/dL (ref 2.5–4.6)

## 2020-07-29 MED ORDER — RISAQUAD PO CAPS
1.0000 | ORAL_CAPSULE | Freq: Every day | ORAL | Status: DC
  Administered 2020-07-29: 1 via ORAL
  Filled 2020-07-29: qty 1

## 2020-07-29 MED ORDER — RISAQUAD PO CAPS: 1.0000 | ORAL_CAPSULE | Freq: Every day | ORAL | Status: AC

## 2020-07-29 MED ORDER — LOPERAMIDE HCL 2 MG PO CAPS
2.0000 mg | ORAL_CAPSULE | Freq: Once | ORAL | Status: AC
  Administered 2020-07-29: 2 mg via ORAL
  Filled 2020-07-29: qty 1

## 2020-07-29 NOTE — Progress Notes (Signed)
Pt IV removed, catheter intact. Pt has all belongings and understands d/c instructions. Pt d/c via wheelchair by volunteer services.

## 2020-07-30 ENCOUNTER — Telehealth: Payer: Self-pay | Admitting: Licensed Clinical Social Worker

## 2020-07-30 ENCOUNTER — Telehealth: Payer: Self-pay

## 2020-07-30 NOTE — Telephone Encounter (Signed)
Transition Care Management Follow-up Telephone Call  Date of discharge and from where: 07/28/2020, [facility]   How have you been since you were released from the hospital? He said he is feeling pretty good  Any questions or concerns? No   He said he was contacted by someone regarding applying for [employer]. He currently only has family planning medicaid  This CM to send him application for [facility] card for [facility] assistance   He also stated that he has an attorney who is assisting him with a disability application.  Items Reviewed:  Did the pt receive and understand the discharge instructions provided? Yes   Medications obtained and verified? Yes - he said he has all medications and did not have any questions about his med regime.   Other? No   Any new allergies since your discharge? No   Do you have support at home? Yes , his mother  Home Care and Equipment/Supplies: Were home health services ordered? no If so, what is the name of the agency? n/a  Has the agency set up a time to come to the patient's home? n/a Were any new equipment or medical supplies ordered?  No What is the name of the medical supply agency? n/a Were you able to get the supplies/equipment? n/a Do you have any questions related to the use of the equipment or supplies? No, n/a  He already has a Orthoptist Questionnaire: (I = Independent and D = Dependent) ADLs:independent  Follow up appointments reviewed:   PCP Hospital f/u appt confirmed? Yes  - Dr Earlene Plater- 08/05/2020  Specialist Hospital f/u appt confirmed? Yes - cardiology  - 08/17/2020, he has to call pulmonary to schedule an appointment. Provided him with the number for Dr Vassie Loll.  Are transportation arrangements needed? No not at this time; but he may need assistance from [facility] for future appointments  If their condition worsens, is the pt aware to call PCP or go to the Emergency Dept.?yes  Was  the patient provided with contact information for the PCP's office or ED?  He has the phone number for [facility]  Was to pt encouraged to call back with questions or concerns? yes

## 2020-07-30 NOTE — Progress Notes (Signed)
Heart and Vascular Care Navigation  07/30/2020  Jesus Fuller 1976/03/26 [id number]  Reason for Referral: Recent hospital discharge, Family Planning only                                                                                                     Assessment:                                     CSW spoke with pt via telephone at [phone]. Introduced self, role, reason for call. Pt does not recall our previous conversations on 10/27. He confirms he is still waiting for a disability determination and has a Clinical research associate working w/ him. He still lives in the same apartment and has good support from his mother who takes him to appointments and assists financially with his medications which he picks up at [facility]. He understands that he has several upcoming appointments that are important for him to go to and he has the discharge paperwork with all that information with him at home.   He has been feeling alright since discharge. He still has Medicaid Family Planning only and is interested in completing the CAFA form. He requests that I mail that to him at [address] [place], [place], [address]. He also is interested in referral to [facility] if needed and is aware I will mail him their card and my card for any additional questions/concerns.   HRT/VAS Care Coordination    Patients Home Cardiology Office [facility]   Outpatient Care Team Social Worker   Social Worker Name: Esmeralda Links [place], [phone]   Living arrangements for the past 2 months Apartment   Lives with: Self   Patient Current Insurance Coverage Self-Pay  Medicaid Family Planning   Patient Has Concern With Paying Medical Bills Yes   Patient Concerns With Medical Bills outstanding medical bills not covered by his Family Planning   Medical Bill Referrals: CAFA application mailed to pt on 07/30/2020   Does Patient Have Prescription Coverage? No   Patient Prescription  Assistance Programs Other   Other Assistance Programs Medications [facility], mother assists with paying for medications at this time   Home Assistive Devices/Equipment None   DME Agency NA   HH Agency NA   Current home services DME      Social History:                                                                             SDOH Screenings   Alcohol Screen: Not on file  Depression (PHQ2-9): Low Risk   . PHQ-2 Score: 0  Financial Resource Strain: Not on file  Food Insecurity: Not on file  Housing: Low  Risk   . Last Housing Risk Score: 0  Physical Activity: Not on file  Social Connections: Not on file  Stress: Not on file  Tobacco Use: High Risk  . Smoking Tobacco Use: Current Every Day Smoker  . Smokeless Tobacco Use: Never Used  Transportation Needs: No Transportation Needs  . Lack of Transportation (Medical): No  . Lack of Transportation (Non-Medical): No    SDOH Interventions: Housing Insecurity:  Housing Interventions: Intervention Not Indicated  Transportation:   Transportation Interventions: Retail banker (Pt referred for [employer] since he relys on his mother to take him to appointments.)   Other Care Navigation Interventions:     Provided Pharmacy assistance resources Other  Patient Referred to: [facility]   Follow-up plan:   CSW mailed CAFA application along with list of needed documents. I also included my card and the card for [employer]. I have sent his referral to [employer] as well.

## 2020-08-05 ENCOUNTER — Ambulatory Visit (INDEPENDENT_AMBULATORY_CARE_PROVIDER_SITE_OTHER): Payer: Self-pay | Admitting: Internal Medicine

## 2020-08-05 ENCOUNTER — Other Ambulatory Visit: Payer: Self-pay | Admitting: Internal Medicine

## 2020-08-05 ENCOUNTER — Other Ambulatory Visit: Payer: Self-pay

## 2020-08-05 ENCOUNTER — Encounter: Payer: Self-pay | Admitting: Internal Medicine

## 2020-08-05 VITALS — BP 121/81 | HR 104 | Temp 98.7°F | Resp 20 | Ht 69.0 in | Wt 271.0 lb

## 2020-08-05 DIAGNOSIS — I5042 Chronic combined systolic (congestive) and diastolic (congestive) heart failure: Secondary | ICD-10-CM

## 2020-08-05 DIAGNOSIS — Z09 Encounter for follow-up examination after completed treatment for conditions other than malignant neoplasm: Secondary | ICD-10-CM

## 2020-08-05 DIAGNOSIS — R600 Localized edema: Secondary | ICD-10-CM

## 2020-08-05 MED ORDER — TORSEMIDE 20 MG PO TABS: 20.0000 mg | ORAL_TABLET | Freq: Every day | ORAL | 3 refills | Status: DC

## 2020-08-05 MED FILL — TORSEMIDE 20 MG TABLET: 20 | 30 days supply | Qty: 30 | Fill #0

## 2020-08-05 NOTE — Progress Notes (Signed)
Subjective:    Jesus Fuller - 45 y.o. male MRN [id number]  Date of birth: [birth year]  HPI  Jesus Fuller is here for hospital discharge f/u. Patient was admitted from 1/11-1/15 for acute exacerbation of CHF. Patient was 266 lbs at time of hospital discharge.  He reports diffuse swelling throughout entire body. Feels like he has gained weight. Has been compliant with his medications.   Recommendations for Outpatient Follow-up:  1. Follow-up with pulmonary within a week 2. Follow-up with cardiology within a week 3. Follow-up with your primary care provider in 1 to 2 weeks 4. Take your medications as prescribed 5. Completely abstain from alcohol and tobacco use.   Hospital Course:  Active Problems:   Syncope and collapse   Troponin level elevated   Cough syncope   COPD with acute exacerbation (HCC)  Improving recurrent cough syncope with prior diagnosis of posttussive syncope Patient with diagnosis of post tussive syncope,think that he is having more episodes as he is having more cough for the past 5 months. Cardiology was consulted and they advised admission for observation so they can interrogate his implantable loop recorder, no evidence of arrhythmia or sinus pauses. Echocardiogram revealed improved LVEF 60 to 65% from 30 to 35% previously. UA and UDS unremarkable Seen by cardiology, signed off. No changes in his home cardiac medications.   Follow-up with cardiology within a week. Follow-up with pulmonary within a week.  Volume overload in the setting of chronic combined systolic and diastolic CHF Received diuresing Net I&O -2.0 L. Resume home torsemide along with potassium replacement.  Upper airway cough syndrome/asthma exacerbation Seen by pulmonary Recommended IV Solu-Medrol 40 mg twice daily until bronchospasm subsides,switched to prednisone on 07/27/2020 Continue bronchodilator, Flonase and Protonix for GERD as recommended by pulmonary. Continue prednisone  taper.  Chronic combined systolic and diastolic CHF Prior to admission he was on torsemide Received IV Lasix increased to 40 mg 3 times daily. Resume home torsemide with potassium replacement Continue home cardiac medications Follow-up with your cardiologist within a week.  Resolved post repletion:Refractory hypokalemia in the setting of diuretics.  Resolved refractoryhypomagnesia in the setting of IV diuretics Magnesium 1.6>1.4> 1.9 on 07/18/2020.  Hypocalcemia Serum calcium 6.5, corrected for albumin 7.6 Replete intravenously and orally Serum calcium 6.8, corrected for albumin 7.9. Continue oral replacement x7 days.  Severe hypophosphatemia Repleted intravenously and orally. Serum phosphorus 1.1 Continue oral replacement x7 days.  Likely OSA Patient will need home sleep study as outpatient Will need to follow-up with pulmonary  GERD Continue PPI per pulmonology, continue  Lactic acidosis. Patient appears little dry and he was hypoxic on arrival. He was afebrile and no leukocytosis. Received a dose of ceftriaxone and Zithromax in the ED, then was discontinued per admitting provider. Received IV fluid Lactic acid level down trended Procalcitonin 0.57, repeat 0.39  Elevated troponin. No evidence of acute ischemia on twelve-lead EKG Troponin peaked at 27 History of nonischemic cardiomyopathy Follow results of 2D echo 07/24/2020, normal LVEF 55 to 60%. Denies any anginal symptoms at the time of this visit. Follow-up with cardiology within 1 week  Nonischemic cardiomyopathy. LV EF was improved on recent echo.  Was compliant with his diuretic and was taking torsemide  2D echo completed on 07/24/2020  Resolved hypervolemic hyponatremia Presented with serum sodium of 126>> 133> 136 Repeat BMP and follow-up with your PCP on 07/31/2020.  Nicotine dependence. Cessation counseling at bedside. -Nicotine patch.  Alcohol use disorder with concern for  withdrawal No evidence of alcohol  withdrawal at the time of this visit.    Health Maintenance:  Health Maintenance Due  Topic Date Due  . Hepatitis C Screening  Never done    -  reports that he has been smoking cigarettes. He has been smoking about 1.50 packs per day. He has never used smokeless tobacco. - Review of Systems: Per HPI. - Past Medical History: Patient Active Problem List   Diagnosis Date Noted  . Troponin level elevated   . Cough syncope   . COPD with acute exacerbation (HCC)   . Acute bronchitis 02/02/2020  . Syncope and collapse 02/02/2020  . 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 Chronic combined systolic and diastolic heart failure (HCC) 12/05/2019  . Essential hypertension 12/02/2019  . Tobacco abuse 11/10/2019   - Medications: reviewed and updated   Objective:   Physical Exam BP 121/81 (BP Location: Right Arm, Patient Position: Sitting, Cuff Size: Large)   Pulse (!) 104   Temp 98.7 F (37.1 C) (Oral)   Resp 20   Ht 5\' 9"  (1.753 m)   Wt 271 lb (122.9 kg)   SpO2 93%   BMI 40.02 kg/m  Physical Exam Constitutional:      General: He is not in acute distress.    Appearance: He is not diaphoretic.  HENT:     Head: Normocephalic and atraumatic.  Eyes:     Extraocular Movements: EOM normal.     Conjunctiva/sclera: Conjunctivae normal.  Cardiovascular:     Rate and Rhythm: Normal rate and regular rhythm.     Heart sounds: Normal heart sounds. No murmur heard.   Pulmonary:     Effort: Pulmonary effort is normal. No respiratory distress.     Breath sounds: Normal breath sounds.  Musculoskeletal:        General: Normal range of motion.  Skin:    General: Skin is warm and dry.     Comments: Diffuse edema throughout entire body.   Neurological:     Mental Status: He is alert and oriented to person, place, and time.  Psychiatric:        Mood and Affect: Affect normal.        Judgment: Judgment  normal.            Assessment & Plan:   1. Hospital discharge follow-up Reviewed hospital course, current medications, ensured proper f/u in place, and addressed concerns.   2. Chronic combined systolic and diastolic heart failure [facility]) Patient with recent CHF exacerbation. Has diffuse edema throughout entire body that may be related to recent prednisone use. Potentially related to worsening of cardiac volume status, but aside from edema and increase in weight does not have any symptoms/signs particularly concerning for recurrent CHF exacerbation. Continue Torsemide and have reached out to cardiology for quicker follow up for evaluation. Due to recent hospitalization, will increase Torsemide to two tablets for 3 days and instructed to take additional K tablet those days too. If determines non cardiac etiology, will need to work up for other causes of his edema.  - torsemide (DEMADEX) 20 MG tablet; Take 1 tablet (20 mg total) by mouth daily.  Dispense: 90 tablet; Refill: 3  3. Bilateral leg edema - torsemide (DEMADEX) 20 MG tablet; Take 1 tablet (20 mg total) by mouth daily.  Dispense: 90 tablet; Refill: 3  [facility], D.O. 08/05/2020, 2:44 PM Primary Care at [facility]

## 2020-08-05 NOTE — Progress Notes (Signed)
Hospital follow-up, discharged on 08/03/20. Increased swelling and shortness of breath w/ exertion, LBP/R leg pain/numbness/tingling after standing/activity

## 2020-08-05 NOTE — Patient Instructions (Signed)
Take two tablets of Torsemide daily for 3 days. Take an additional potassium supplement each of these days as well.

## 2020-08-07 ENCOUNTER — Ambulatory Visit: Payer: Self-pay | Admitting: Cardiology

## 2020-08-07 ENCOUNTER — Telehealth: Payer: Self-pay | Admitting: Licensed Clinical Social Worker

## 2020-08-07 NOTE — Telephone Encounter (Signed)
Spoke with pt this afternoon via phone call [phone]). He confirms he has received the CAFA application for [employer].  I shared that if he has any additional questions or needs to please call me. No additional questions/concerns at this time.   Octavio Graves, MSW, LCSW [facility] Heart/Vascular Care Navigation  [phone]

## 2020-08-10 ENCOUNTER — Other Ambulatory Visit: Payer: Self-pay

## 2020-08-10 DIAGNOSIS — G4733 Obstructive sleep apnea (adult) (pediatric): Secondary | ICD-10-CM

## 2020-08-12 DIAGNOSIS — Z006 Encounter for examination for normal comparison and control in clinical research program: Secondary | ICD-10-CM

## 2020-08-12 NOTE — Research (Signed)
Alleviate Research Study  Carelink Transmission       

## 2020-08-14 MED FILL — TORSEMIDE 20 MG TABLET: 20 | 30 days supply | Qty: 30 | Fill #0

## 2020-08-17 ENCOUNTER — Telehealth (INDEPENDENT_AMBULATORY_CARE_PROVIDER_SITE_OTHER): Payer: Self-pay | Admitting: Cardiology

## 2020-08-17 DIAGNOSIS — I5042 Chronic combined systolic (congestive) and diastolic (congestive) heart failure: Secondary | ICD-10-CM

## 2020-08-17 DIAGNOSIS — R55 Syncope and collapse: Secondary | ICD-10-CM

## 2020-08-17 DIAGNOSIS — Z720 Tobacco use: Secondary | ICD-10-CM

## 2020-08-17 DIAGNOSIS — Z79899 Other long term (current) drug therapy: Secondary | ICD-10-CM

## 2020-08-17 DIAGNOSIS — I428 Other cardiomyopathies: Secondary | ICD-10-CM

## 2020-08-17 NOTE — Progress Notes (Signed)
Virtual Visit via Telephone Note   This visit type was conducted due to national recommendations for restrictions regarding the COVID-19 Pandemic (e.g. social distancing) in an effort to limit this patient's exposure and mitigate transmission in our community.  Due to his co-morbid illnesses, this patient is at least at moderate risk for complications without adequate follow up.  This format is felt to be most appropriate for this patient at this time.  The patient did not have access to video technology/had technical difficulties with video requiring transitioning to audio format only (telephone).  All issues noted in this document were discussed and addressed.  No physical exam could be performed with this format.  Please refer to the patient's chart for his  consent to telehealth for A Jesus Fuller.   The patient was identified using 2 identifiers.  Patient Location: Home Provider Location: Home Office  Date:  08/17/2020   ID:  Jesus Fuller, DOB [birth year], MRN [id number]  PCP:  Arvilla Market, DO  Cardiologist:  Jodelle Red, MD  Referring MD: Leary Roca*   CC: follow up  History of Present Illness:    Jesus Fuller is a 45 y.o. male with a hx of chronic systolic and diastolic heart failure, nonischemic cardiomyopathy, hypertension (previously uncontrolled), alcohol/tobacco use, LLE DVT previously on apixaban who is seen for follow up today. I initially saw him 12/05/19 as a new consult at the request of Leary Roca* for the evaluation and management of heart failure with recent hospitalization.  Today: Reviewed recent admission, discharged 07/28/20. Weight went up with prednisone, currently about 260 lbs. Lowest weight was around 240 lbs. Has swelling across his entire body, up to his face. Has been out of medications for a few days, currently at the pharmacy to get them refilled. Swells more throughout the day, better in the morning.  Noted that his stomach is more swollen, which hasn't happened before. Has had to go up several pants sizes. His recent LFTs were abnormal (low albumin, mildly elevated transaminases). No prior dedicated liver imaging, but on CT in 2021 liver noted as mild fatty liver. Has worked on decreasing his alcohol. Now doesn't drink much during the week, but drinks 2 fifths in 3 days over the weekend.  Feels better, despite swelling. Reviewed his recent echo, which showed normal LV function, normal diastolic function, and normal PA pressures. We discussed that this is very concerning for a non-cardiac cause of his swelling.   Still having syncope about 2-3 times/week, coughing and syncope improved on steroids.  ROS positive for nerve pain in his back.  Past Medical History:  Diagnosis Date  . Alcoholism /alcohol abuse   . Aortic atherosclerosis (HCC)   . Asthma   . Chest pain with normal coronary angiography 10/2018  . Chronic combined systolic and diastolic CHF (congestive heart failure) (HCC)   . Fatty liver   . Hypertension    Takes no medicine  . Left leg DVT (HCC) 09/2019  . NICM (nonischemic cardiomyopathy) (HCC)   . Tobacco abuse     Past Surgical History:  Procedure Laterality Date  . implantable loop recorder placement  05/25/2020   [employer]onic Reveal [place] model [id number] (SN [id number]) implanted by Dr Johney Frame for Alleviate HF study  . RIGHT/LEFT HEART CATH AND CORONARY ANGIOGRAPHY N/A 10/24/2018   Procedure: RIGHT/LEFT HEART CATH AND CORONARY ANGIOGRAPHY;  Surgeon: Orpah Cobb, MD;  Location: [facility] INVASIVE CV LAB;  Service: Cardiovascular;  Laterality: N/A;    Current Medications:  Current Outpatient Medications on File Prior to Visit  Medication Sig  . acidophilus (RISAQUAD) CAPS capsule Take 1 capsule by mouth daily.  Marland Kitchen albuterol (PROVENTIL) (2.5 MG/3ML) 0.083% nebulizer solution Take 3 mLs (2.5 mg total) by nebulization every 2 (two) hours as needed for wheezing.  Marland Kitchen albuterol (VENTOLIN  HFA) 108 (90 Base) MCG/ACT inhaler Inhale 2 puffs into the lungs every 6 (six) hours as needed for wheezing or shortness of breath.  . carvedilol (COREG) 12.5 MG tablet Take 1 tablet (12.5 mg total) by mouth 2 (two) times daily.  . fluticasone (FLONASE) 50 MCG/ACT nasal spray Fuller 1 spray into both nostrils 2 (two) times daily.  . folic acid (FOLVITE) 1 MG tablet Take 1 tablet (1 mg total) by mouth daily.  Marland Kitchen gabapentin (NEURONTIN) 100 MG capsule Take 2 capsules (200 mg total) by mouth 3 (three) times daily.  . hydrocortisone (ANUSOL-HC) 2.5 % rectal cream Fuller rectally 2 (two) times daily as needed for hemorrhoids.  . mometasone-formoterol (DULERA) 100-5 MCG/ACT AERO Inhale 2 puffs into the lungs 2 (two) times daily.  . Multiple Vitamin (MULTIVITAMIN WITH MINERALS) TABS tablet Take 1 tablet by mouth daily.  . nicotine (NICODERM CQ - DOSED IN MG/24 HOURS) 21 mg/24hr patch Fuller 1 patch (21 mg total) onto the skin daily.  . pantoprazole (PROTONIX) 40 MG tablet Take 1 tablet (40 mg total) by mouth daily at 12 noon.  . potassium chloride SA (KLOR-CON) 20 MEQ tablet Take 2 tablets (40 mEq total) by mouth daily. Take 40 meq daily when you take Torsemide 20 mg daily.  Marland Kitchen thiamine 100 MG tablet Take 1 tablet (100 mg total) by mouth daily.  Marland Kitchen torsemide (DEMADEX) 20 MG tablet Take 1 tablet (20 mg total) by mouth daily.  . calcium carbonate (OS-CAL - DOSED IN MG OF ELEMENTAL CALCIUM) 1250 (500 Ca) MG tablet Take 1 tablet (500 mg of elemental calcium total) by mouth 3 (three) times daily with meals for 7 days.  . [DISCONTINUED] atorvastatin (LIPITOR) 40 MG tablet Take 1 tablet (40 mg total) by mouth daily at 6 PM. (Patient not taking: Reported on 03/27/2019)   No current facility-administered medications on file prior to visit.     Allergies:   Patient has no known allergies.   Social History   Tobacco Use  . Smoking status: Current Every Day Smoker    Packs/day: 1.50    Types: Cigarettes  . Smokeless  tobacco: Never Used  Vaping Use  . Vaping Use: Never used  Substance Use Topics  . Alcohol use: Yes    Comment: occasional  . Drug use: No    Family History: family history includes COPD in his mother; Cancer in his mother; Diabetes in his father.  ROS:   Please see the history of present illness.  Additional pertinent ROS otherwise unremarkable   EKGs/Labs/Other Studies Reviewed:    The following studies were reviewed today: Echo 07/24/20 1. Left ventricular ejection fraction, by estimation, is 55 to 60%. The  left ventricle has normal function. Left ventricular endocardial border  not optimally defined to evaluate regional wall motion. Left ventricular  diastolic parameters were normal.  2. Right ventricular systolic function is normal. The right ventricular  size is normal. Tricuspid regurgitation signal is inadequate for assessing  PA pressure.  3. The mitral valve was not well visualized. No evidence of mitral valve  regurgitation.  4. The aortic valve was not well visualized. Aortic valve regurgitation  is not visualized.  5. Technically  difficult study with very poor acoustic windows.   Echo 11/11/19 1. Left ventricular ejection fraction, by estimation, is 45 to 50%. The  left ventricle has mildly decreased function. The left ventricle  demonstrates regional wall motion abnormalities (see scoring  diagram/findings for description). Left ventricular  diastolic parameters are consistent with Grade II diastolic dysfunction  (pseudonormalization). There is mild hypokinesis of the left ventricular,  basal anterior wall, septal wall and inferior wall.  2. Right ventricular systolic function is mildly reduced. The right  ventricular size is mildly enlarged.  3. Left atrial size was moderately dilated.  4. Right atrial size was moderately dilated.  5. The mitral valve is normal in structure. Trivial mitral valve  regurgitation.  6. The aortic valve is normal in  structure. Aortic valve regurgitation is  not visualized.  7. The inferior vena cava is normal in size with greater than 50%  respiratory variability, suggesting right atrial pressure of 3 mmHg.   Echo 10/25/18 1. Mild hypokinesis of the left ventricular, entire anteroseptal wall and  inferolateral wall.  2. The left ventricle has moderately reduced systolic function, with an  ejection fraction of 35-40%. The cavity size was normal. Left ventricular  diastolic Doppler parameters are consistent with impaired relaxation.  3. The right ventricle has normal systolic function. The cavity was  normal. There is no increase in right ventricular wall thickness.   R/LHC 10/24/18 Normal cors, normal LVEDP. CO 5.3, CI 2.61, RA mean 0, PA 21/7 (13). Wedge mean 1. LVEDP 4. Ao 122/81.  EKG:  EKG is personally reviewed.  The ekg ordered 07/22/2020 demonstrates sinus tachycardia at 102 bpm  Recent Labs: 11/11/2019: TSH 2.775 05/25/2020: NT-Pro BNP 409 07/22/2020: B Natriuretic Peptide 35.8 07/24/2020: Hemoglobin 14.9; Platelets 124 07/29/2020: ALT 92; BUN 17; Creatinine, Ser 1.04; Magnesium 2.2; Potassium 3.5; Sodium 135  Recent Lipid Panel    Component Value Date/Time   CHOL 177 10/24/2018 0832   TRIG 122 10/24/2018 0832   HDL 63 10/24/2018 0832   CHOLHDL 2.8 10/24/2018 0832   VLDL 24 10/24/2018 0832   LDLCALC 90 10/24/2018 0832    Physical Exam:    VS:  There were no vitals taken for this visit.    Wt Readings from Last 3 Encounters:  08/05/20 271 lb (122.9 kg)  07/26/20 266 lb 4.8 oz (120.8 kg)  06/10/20 250 lb (113.4 kg)    Speaking comfortably on the phone, no audible wheezing In no acute distress Alert and oriented Normal affect Normal speech  ASSESSMENT:    1. Chronic combined systolic and diastolic heart failure (HCC)   2. Medication management   3. Syncope, unspecified syncope type   4. Tobacco abuse   5. NICM (nonischemic cardiomyopathy) (HCC)    PLAN:    Syncope: -highly  related to cough. He knows when to expect it and now lays down when he feels it coming. -no evidence of pause/arrhythmia as cause based on monitor -improved with recent steroid treatment  Chronic combined systolic and diastolic heart failure, nonischemic cardiomyopathy: recent EF normalized. -echo, cath reviewed as above -has been enrolled in the Alleviate-HF trial. Has PRN lasix and potassium as part of the trial -continue management of risk factors, including hypertension, tobacco, and alcohol use -I am concerned about his reported swelling and weight gain. He now endorses diffuse swelling. With his normalized EF and history of alcohol use, I am concerned that this may be swelling from a noncardiac cause. Will order labs (CMET, BNP), he will return  for these. -on only carvedilol and torsemide at this time. -instructed him to monitor swelling/weight closely and call me if they worsen  Hypertension: no vitals today -goal <130/80 -off of amlodipine, losartan given syncope -continue carvedilol 12.5 mg BID -continue torsemide  History of DVT of lower extremity: diagnosed during hospitalization 10/2019 -was given 3 mos of apixaban, now discontinued  Tobacco abuse counseling: continues to work on quitting  Cardiac risk counseling and prevention recommendations: -recommend heart healthy/Mediterranean diet, with whole grains, fruits, vegetable, fish, lean meats, nuts, and olive oil. Limit salt. -recommend moderate walking, 3-5 times/week for 30-50 minutes each session. Aim for at least 150 minutes.week. Goal should be pace of 3 miles/hours, or walking 1.5 miles in 30 minutes -recommend avoidance of tobacco products. Avoid excess alcohol.  Plan for follow up: 3-4 weeks  Today, I have spent 13 minutes with the patient with telehealth technology discussing the above problems.  Additional time spent in chart review, documentation, and communication.  Jodelle Red, MD, PhD, [facility]  [facility]    Medication Adjustments/Labs and Tests Ordered: Current medicines are reviewed at length with the patient today.  Concerns regarding medicines are outlined above.  Orders Placed This Encounter  Procedures  . Comprehensive metabolic panel  . Brain natriuretic peptide   No orders of the defined types were placed in this encounter.   Patient Instructions  Medication Instructions:  Your Physician recommend you continue on your current medication as directed.    *If you need a refill on your cardiac medications before your next appointment, please call your pharmacy*   Lab Work: Your physician recommends that you return for lab work (CMP,BNP).  If you have labs (blood work) drawn today and your tests are completely normal, you will receive your results only by: Marland Kitchen MyChart Message (if you have MyChart) OR . A paper copy in the mail If you have any lab test that is abnormal or we need to change your treatment, we will call you to review the results.   Testing/Procedures: None   Follow-Up: At [facility], you and your health needs are our priority.  As part of our continuing mission to provide you with exceptional heart care, we have created designated Provider Care Teams.  These Care Teams include your primary Cardiologist (physician) and Advanced Practice Providers (APPs -  Physician Assistants and Nurse Practitioners) who all work together to provide you with the care you need, when you need it.  We recommend signing up for the patient portal called "MyChart".  Sign up information is provided on this After Visit Summary.  MyChart is used to connect with patients for Virtual Visits (Telemedicine).  Patients are able to view lab/test results, encounter notes, upcoming appointments, etc.  Non-urgent messages can be sent to your provider as well.   To learn more about what you can do with MyChart, go to [email or web address].    Your next appointment:   3-4  week(s)  The format for your next appointment:   In Person  Provider:   Jodelle Red, MD       Signed, Jodelle Red, MD PhD 08/17/2020  [facility]

## 2020-08-17 NOTE — Patient Instructions (Signed)
Medication Instructions:  Your Physician recommend you continue on your current medication as directed.    *If you need a refill on your cardiac medications before your next appointment, please call your pharmacy*   Lab Work: Your physician recommends that you return for lab work (CMP,BNP).  If you have labs (blood work) drawn today and your tests are completely normal, you will receive your results only by: Marland Kitchen MyChart Message (if you have MyChart) OR . A paper copy in the mail If you have any lab test that is abnormal or we need to change your treatment, we will call you to review the results.   Testing/Procedures: None   Follow-Up: At [facility], you and your health needs are our priority.  As part of our continuing mission to provide you with exceptional heart care, we have created designated Provider Care Teams.  These Care Teams include your primary Cardiologist (physician) and Advanced Practice Providers (APPs -  Physician Assistants and Nurse Practitioners) who all work together to provide you with the care you need, when you need it.  We recommend signing up for the patient portal called "MyChart".  Sign up information is provided on this After Visit Summary.  MyChart is used to connect with patients for Virtual Visits (Telemedicine).  Patients are able to view lab/test results, encounter notes, upcoming appointments, etc.  Non-urgent messages can be sent to your provider as well.   To learn more about what you can do with MyChart, go to [email or web address].    Your next appointment:   3-4 week(s)  The format for your next appointment:   In Person  Provider:   Jodelle Red, MD

## 2020-08-19 ENCOUNTER — Telehealth: Payer: Self-pay | Admitting: Licensed Clinical Social Worker

## 2020-08-19 NOTE — Telephone Encounter (Signed)
LCSW called pt at [phone]. Pt mother picked up the phone and states pt is sleeping. LCSW has gotten permission to speak with mother and DPR is on file. I explained that I had sent the CAFA form to pt and he had last told me he was working on it on 1/21. Pt mother states she is not sure where he is in the process and will give him a reminder when he wakes up. LCSW will continue to follow.  Octavio Graves, MSW, LCSW [facility] Heart/Vascular Care Navigation  [phone]

## 2020-08-20 ENCOUNTER — Encounter: Payer: Self-pay | Admitting: Cardiology

## 2020-08-25 ENCOUNTER — Other Ambulatory Visit: Payer: Self-pay

## 2020-08-25 ENCOUNTER — Ambulatory Visit: Payer: Medicaid Other

## 2020-08-25 ENCOUNTER — Telehealth: Payer: Self-pay | Admitting: Licensed Clinical Social Worker

## 2020-08-25 DIAGNOSIS — G4733 Obstructive sleep apnea (adult) (pediatric): Secondary | ICD-10-CM

## 2020-08-25 NOTE — Telephone Encounter (Signed)
Called pt phone again at [phone].  Pt mother answered phone again, states again that pt is sleeping again.  Inquired about the applications provided to pt. She states "oh I think we already did those and sent them in," but cannot provide any additional information.  I shared again that pt can call me back at my number for any questions/clarity.  Pt mother states understanding.   Octavio Graves, MSW, LCSW [facility] Heart[facility]Vascular Care Navigation  [phone]

## 2020-08-27 LAB — COMPREHENSIVE METABOLIC PANEL
ALT: 42 IU/L (ref 0–44)
AST: 50 IU/L — ABNORMAL HIGH (ref 0–40)
Albumin/Globulin Ratio: 1.7 (ref 1.2–2.2)
Albumin: 4 g/dL (ref 4.0–5.0)
Alkaline Phosphatase: 119 IU/L (ref 44–121)
BUN/Creatinine Ratio: 6 — ABNORMAL LOW (ref 9–20)
BUN: 5 mg/dL — ABNORMAL LOW (ref 6–24)
Bilirubin Total: 0.4 mg/dL (ref 0.0–1.2)
CO2: 26 mmol/L (ref 20–29)
Calcium: 8.9 mg/dL (ref 8.7–10.2)
Chloride: 94 mmol/L — ABNORMAL LOW (ref 96–106)
Creatinine, Ser: 0.85 mg/dL (ref 0.76–1.27)
GFR calc Af Amer: 124 mL/min/{1.73_m2} (ref >59)
GFR calc non Af Amer: 107 mL/min/{1.73_m2} (ref >59)
Globulin, Total: 2.3 g/dL (ref 1.5–4.5)
Glucose: 96 mg/dL (ref 65–99)
Potassium: 4 mmol/L (ref 3.5–5.2)
Sodium: 142 mmol/L (ref 134–144)
Total Protein: 6.3 g/dL (ref 6.0–8.5)

## 2020-08-27 LAB — BRAIN NATRIURETIC PEPTIDE: BNP: 33.6 pg/mL (ref 0.0–100.0)

## 2020-08-31 ENCOUNTER — Telehealth: Payer: Self-pay | Admitting: Pulmonary Disease

## 2020-08-31 DIAGNOSIS — G4733 Obstructive sleep apnea (adult) (pediatric): Secondary | ICD-10-CM

## 2020-08-31 MED FILL — GABAPENTIN 100 MG CAPSULE: 100 | 30 days supply | Qty: 180 | Fill #0

## 2020-08-31 NOTE — Telephone Encounter (Signed)
HST showed severe OSA with AHI 27/ hr Suggest autoCPAP  5-15 cm, mask of choice  Given severity of desatn, please schedule CPAP titration study to see if oxygen also needed OV with me/APP in 6 wks

## 2020-08-31 NOTE — Telephone Encounter (Signed)
Called and spoke with patient who requested that writer call back in the morning (between 8:00-8:30am) to go over results. Will call patient back tomorrow morning as requested.

## 2020-09-01 NOTE — Telephone Encounter (Signed)
Called and went over HST results per Dr Vassie Loll with patient. All questions answered and patient expressed full understanding of results and agreeable to Dr Reginia Naas recommendations for CPAP and CPAP titration study to be ordered. Orders placed per Dr Vassie Loll. Scheduled 6 week follow up office visit with Dr Vassie Loll for Wednesday 10/21/2020 at 9am. Patient agreeable to time, date and location. Nothing further needed at this time.

## 2020-09-01 NOTE — Addendum Note (Signed)
Addended by: Melonie Florida on: 09/01/2020 08:53 AM   Modules accepted: Orders

## 2020-09-08 MED FILL — CARVEDILOL 12.5 MG TABLET: 12.5 | 30 days supply | Qty: 60 | Fill #0

## 2020-09-08 MED FILL — TORSEMIDE 20 MG TABLET: 20 | 30 days supply | Qty: 30 | Fill #1

## 2020-09-08 MED FILL — PANTOPRAZOLE SOD DR 40 MG T: 40 | 30 days supply | Qty: 30 | Fill #0

## 2020-09-09 ENCOUNTER — Ambulatory Visit: Payer: Medicaid Other | Admitting: Cardiology

## 2020-09-09 DIAGNOSIS — Z006 Encounter for examination for normal comparison and control in clinical research program: Secondary | ICD-10-CM

## 2020-09-09 NOTE — Research (Signed)
Alleviate Research Study  Carelink Transmission       

## 2020-09-11 MED FILL — FLUTICASONE PROP 50 MCG SPR: 50 | 28 days supply | Qty: 16 | Fill #0

## 2020-09-11 MED FILL — FOLIC ACID 1 MG TABS: 1 | 30 days supply | Qty: 30 | Fill #0

## 2020-09-11 MED FILL — DULERA 100-5 MCG/ACT AERO: 100-5 | 28 days supply | Qty: 13 | Fill #0

## 2020-09-16 ENCOUNTER — Encounter (HOSPITAL_BASED_OUTPATIENT_CLINIC_OR_DEPARTMENT_OTHER): Payer: Medicaid Other | Admitting: Pulmonary Disease

## 2020-09-22 DIAGNOSIS — Z006 Encounter for examination for normal comparison and control in clinical research program: Secondary | ICD-10-CM

## 2020-09-22 NOTE — Research (Signed)
Alleviate Research Study  4 Month follow-up  Patient doing well at this time no adverse events or medication changes to report at this time. Patient stated that he needed to reschedule his appointment with his cardiologist and follow-up with Social workers this week.   Current Outpatient Medications:  .  acidophilus (RISAQUAD) CAPS capsule, Take 1 capsule by mouth daily., Disp: , Rfl:  .  albuterol (PROVENTIL) (2.5 MG/3ML) 0.083% nebulizer solution, Take 3 mLs (2.5 mg total) by nebulization every 2 (two) hours as needed for wheezing., Disp: 75 mL, Rfl: 0 .  albuterol (VENTOLIN HFA) 108 (90 Base) MCG/ACT inhaler, Inhale 2 puffs into the lungs every 6 (six) hours as needed for wheezing or shortness of breath., Disp: 18 g, Rfl: 0 .  calcium carbonate (OS-CAL - DOSED IN MG OF ELEMENTAL CALCIUM) 1250 (500 Ca) MG tablet, Take 1 tablet (500 mg of elemental calcium total) by mouth 3 (three) times daily with meals for 7 days., Disp: 21 tablet, Rfl: 0 .  carvedilol (COREG) 12.5 MG tablet, Take 1 tablet (12.5 mg total) by mouth 2 (two) times daily., Disp: 180 tablet, Rfl: 0 .  fluticasone (FLONASE) 50 MCG/ACT nasal spray, Place 1 spray into both nostrils 2 (two) times daily., Disp: 11.1 mL, Rfl: 2 .  folic acid (FOLVITE) 1 MG tablet, Take 1 tablet (1 mg total) by mouth daily., Disp: 90 tablet, Rfl: 0 .  gabapentin (NEURONTIN) 100 MG capsule, Take 2 capsules (200 mg total) by mouth 3 (three) times daily., Disp: 540 capsule, Rfl: 0 .  hydrocortisone (ANUSOL-HC) 2.5 % rectal cream, Place rectally 2 (two) times daily as needed for hemorrhoids., Disp: 30 g, Rfl: 0 .  mometasone-formoterol (DULERA) 100-5 MCG/ACT AERO, Inhale 2 puffs into the lungs 2 (two) times daily., Disp: 1 each, Rfl: 1 .  Multiple Vitamin (MULTIVITAMIN WITH MINERALS) TABS tablet, Take 1 tablet by mouth daily., Disp: 90 tablet, Rfl: 0 .  nicotine (NICODERM CQ - DOSED IN MG/24 HOURS) 21 mg/24hr patch, Place 1 patch (21 mg total) onto the skin  daily., Disp: 28 patch, Rfl: 0 .  pantoprazole (PROTONIX) 40 MG tablet, Take 1 tablet (40 mg total) by mouth daily at 12 noon., Disp: 90 tablet, Rfl: 0 .  potassium chloride SA (KLOR-CON) 20 MEQ tablet, Take 2 tablets (40 mEq total) by mouth daily. Take 40 meq daily when you take Torsemide 20 mg daily., Disp: 60 tablet, Rfl: 0 .  thiamine 100 MG tablet, Take 1 tablet (100 mg total) by mouth daily., Disp: 90 tablet, Rfl: 0 .  torsemide (DEMADEX) 20 MG tablet, Take 1 tablet (20 mg total) by mouth daily., Disp: 90 tablet, Rfl: 3

## 2020-10-05 ENCOUNTER — Ambulatory Visit (INDEPENDENT_AMBULATORY_CARE_PROVIDER_SITE_OTHER): Payer: Self-pay

## 2020-10-05 DIAGNOSIS — I5042 Chronic combined systolic (congestive) and diastolic (congestive) heart failure: Secondary | ICD-10-CM

## 2020-10-13 NOTE — Progress Notes (Signed)
Carelink Summary Report / Loop Recorder 

## 2020-10-21 ENCOUNTER — Ambulatory Visit: Payer: Medicaid Other | Admitting: Pulmonary Disease

## 2020-10-26 DIAGNOSIS — Z006 Encounter for examination for normal comparison and control in clinical research program: Secondary | ICD-10-CM

## 2020-10-26 NOTE — Research (Signed)
Alleviate HF Study  Carelink Transmission     

## 2020-11-05 ENCOUNTER — Other Ambulatory Visit: Payer: Self-pay

## 2020-11-05 ENCOUNTER — Other Ambulatory Visit: Payer: Self-pay | Admitting: Internal Medicine

## 2020-11-05 DIAGNOSIS — Z8709 Personal history of other diseases of the respiratory system: Secondary | ICD-10-CM

## 2020-11-05 DIAGNOSIS — R053 Chronic cough: Secondary | ICD-10-CM

## 2020-11-05 MED ORDER — ALBUTEROL SULFATE HFA 108 (90 BASE) MCG/ACT IN AERS
2.0000 | INHALATION_SPRAY | Freq: Four times a day (QID) | RESPIRATORY_TRACT | 0 refills | Status: AC | PRN
  Filled 2020-11-05: qty 18, 25d supply, fill #0

## 2020-11-05 MED FILL — Folic Acid Tab 1 MG: ORAL | 30 days supply | Qty: 30 | Fill #0 | Status: CN

## 2020-11-05 MED FILL — Gabapentin Cap 100 MG: ORAL | 30 days supply | Qty: 180 | Fill #0 | Status: CN

## 2020-11-05 MED FILL — Torsemide Tab 20 MG: ORAL | 30 days supply | Qty: 30 | Fill #0 | Status: AC

## 2020-11-05 MED FILL — Pantoprazole Sodium EC Tab 40 MG (Base Equiv): ORAL | 30 days supply | Qty: 30 | Fill #0 | Status: AC

## 2020-11-05 MED FILL — Carvedilol Tab 12.5 MG: ORAL | 30 days supply | Qty: 60 | Fill #0 | Status: AC

## 2020-11-06 ENCOUNTER — Inpatient Hospital Stay (HOSPITAL_COMMUNITY)
Admission: EM | Admit: 2020-11-06 | Discharge: 2020-11-15 | DRG: 871 | Disposition: E | Payer: Self-pay | Attending: Internal Medicine | Admitting: Internal Medicine

## 2020-11-06 ENCOUNTER — Emergency Department (HOSPITAL_COMMUNITY): Payer: Self-pay

## 2020-11-06 ENCOUNTER — Other Ambulatory Visit: Payer: Self-pay

## 2020-11-06 ENCOUNTER — Encounter (HOSPITAL_COMMUNITY): Payer: Self-pay | Admitting: Internal Medicine

## 2020-11-06 ENCOUNTER — Inpatient Hospital Stay (HOSPITAL_COMMUNITY): Payer: Self-pay

## 2020-11-06 DIAGNOSIS — Z978 Presence of other specified devices: Secondary | ICD-10-CM

## 2020-11-06 DIAGNOSIS — K761 Chronic passive congestion of liver: Secondary | ICD-10-CM | POA: Diagnosis present

## 2020-11-06 DIAGNOSIS — J9602 Acute respiratory failure with hypercapnia: Secondary | ICD-10-CM

## 2020-11-06 DIAGNOSIS — K760 Fatty (change of) liver, not elsewhere classified: Secondary | ICD-10-CM | POA: Diagnosis present

## 2020-11-06 DIAGNOSIS — Z66 Do not resuscitate: Secondary | ICD-10-CM | POA: Diagnosis not present

## 2020-11-06 DIAGNOSIS — I959 Hypotension, unspecified: Secondary | ICD-10-CM

## 2020-11-06 DIAGNOSIS — Z20822 Contact with and (suspected) exposure to covid-19: Secondary | ICD-10-CM | POA: Diagnosis present

## 2020-11-06 DIAGNOSIS — G9341 Metabolic encephalopathy: Secondary | ICD-10-CM | POA: Diagnosis present

## 2020-11-06 DIAGNOSIS — R197 Diarrhea, unspecified: Secondary | ICD-10-CM | POA: Diagnosis present

## 2020-11-06 DIAGNOSIS — Z833 Family history of diabetes mellitus: Secondary | ICD-10-CM

## 2020-11-06 DIAGNOSIS — I469 Cardiac arrest, cause unspecified: Secondary | ICD-10-CM | POA: Diagnosis not present

## 2020-11-06 DIAGNOSIS — I428 Other cardiomyopathies: Secondary | ICD-10-CM | POA: Diagnosis present

## 2020-11-06 DIAGNOSIS — N390 Urinary tract infection, site not specified: Secondary | ICD-10-CM | POA: Diagnosis present

## 2020-11-06 DIAGNOSIS — F10239 Alcohol dependence with withdrawal, unspecified: Secondary | ICD-10-CM | POA: Diagnosis not present

## 2020-11-06 DIAGNOSIS — J441 Chronic obstructive pulmonary disease with (acute) exacerbation: Secondary | ICD-10-CM

## 2020-11-06 DIAGNOSIS — K7011 Alcoholic hepatitis with ascites: Secondary | ICD-10-CM | POA: Diagnosis present

## 2020-11-06 DIAGNOSIS — I50814 Right heart failure due to left heart failure: Secondary | ICD-10-CM | POA: Diagnosis present

## 2020-11-06 DIAGNOSIS — R6521 Severe sepsis with septic shock: Secondary | ICD-10-CM | POA: Diagnosis present

## 2020-11-06 DIAGNOSIS — I5042 Chronic combined systolic (congestive) and diastolic (congestive) heart failure: Secondary | ICD-10-CM | POA: Diagnosis present

## 2020-11-06 DIAGNOSIS — R579 Shock, unspecified: Secondary | ICD-10-CM

## 2020-11-06 DIAGNOSIS — E8770 Fluid overload, unspecified: Secondary | ICD-10-CM | POA: Diagnosis present

## 2020-11-06 DIAGNOSIS — I509 Heart failure, unspecified: Secondary | ICD-10-CM

## 2020-11-06 DIAGNOSIS — R55 Syncope and collapse: Secondary | ICD-10-CM

## 2020-11-06 DIAGNOSIS — Z79899 Other long term (current) drug therapy: Secondary | ICD-10-CM

## 2020-11-06 DIAGNOSIS — F17210 Nicotine dependence, cigarettes, uncomplicated: Secondary | ICD-10-CM | POA: Diagnosis present

## 2020-11-06 DIAGNOSIS — Z86718 Personal history of other venous thrombosis and embolism: Secondary | ICD-10-CM

## 2020-11-06 DIAGNOSIS — Z825 Family history of asthma and other chronic lower respiratory diseases: Secondary | ICD-10-CM

## 2020-11-06 DIAGNOSIS — N179 Acute kidney failure, unspecified: Secondary | ICD-10-CM

## 2020-11-06 DIAGNOSIS — J9601 Acute respiratory failure with hypoxia: Secondary | ICD-10-CM | POA: Diagnosis not present

## 2020-11-06 DIAGNOSIS — I110 Hypertensive heart disease with heart failure: Secondary | ICD-10-CM | POA: Diagnosis present

## 2020-11-06 DIAGNOSIS — J151 Pneumonia due to Pseudomonas: Secondary | ICD-10-CM | POA: Diagnosis not present

## 2020-11-06 DIAGNOSIS — E872 Acidosis: Secondary | ICD-10-CM | POA: Diagnosis present

## 2020-11-06 DIAGNOSIS — R404 Transient alteration of awareness: Secondary | ICD-10-CM | POA: Diagnosis not present

## 2020-11-06 DIAGNOSIS — I426 Alcoholic cardiomyopathy: Secondary | ICD-10-CM | POA: Diagnosis present

## 2020-11-06 DIAGNOSIS — Z4659 Encounter for fitting and adjustment of other gastrointestinal appliance and device: Secondary | ICD-10-CM

## 2020-11-06 DIAGNOSIS — R34 Anuria and oliguria: Secondary | ICD-10-CM | POA: Diagnosis not present

## 2020-11-06 DIAGNOSIS — E876 Hypokalemia: Secondary | ICD-10-CM

## 2020-11-06 DIAGNOSIS — J690 Pneumonitis due to inhalation of food and vomit: Secondary | ICD-10-CM | POA: Diagnosis not present

## 2020-11-06 DIAGNOSIS — D696 Thrombocytopenia, unspecified: Secondary | ICD-10-CM | POA: Diagnosis present

## 2020-11-06 DIAGNOSIS — A4152 Sepsis due to Pseudomonas: Principal | ICD-10-CM | POA: Diagnosis present

## 2020-11-06 LAB — I-STAT ARTERIAL BLOOD GAS, ED
Acid-Base Excess: 5 mmol/L — ABNORMAL HIGH (ref 0.0–2.0)
Bicarbonate: 29.6 mmol/L — ABNORMAL HIGH (ref 20.0–28.0)
Calcium, Ion: 0.99 mmol/L — ABNORMAL LOW (ref 1.15–1.40)
HCT: 53 % — ABNORMAL HIGH (ref 39.0–52.0)
Hemoglobin: 18 g/dL — ABNORMAL HIGH (ref 13.0–17.0)
O2 Saturation: 100 %
Patient temperature: 98.6
Potassium: 2.6 mmol/L — CL (ref 3.5–5.1)
Sodium: 136 mmol/L (ref 135–145)
TCO2: 31 mmol/L (ref 22–32)
pCO2 arterial: 43.8 mmHg (ref 32.0–48.0)
pH, Arterial: 7.438 (ref 7.350–7.450)
pO2, Arterial: 185 mmHg — ABNORMAL HIGH (ref 83.0–108.0)

## 2020-11-06 LAB — CBC WITH DIFFERENTIAL/PLATELET
Abs Immature Granulocytes: 0.08 10*3/uL — ABNORMAL HIGH (ref 0.00–0.07)
Basophils Absolute: 0 10*3/uL (ref 0.0–0.1)
Basophils Relative: 0 %
Eosinophils Absolute: 0.1 10*3/uL (ref 0.0–0.5)
Eosinophils Relative: 1 %
HCT: 47.2 % (ref 39.0–52.0)
Hemoglobin: 16.7 g/dL (ref 13.0–17.0)
Immature Granulocytes: 1 %
Lymphocytes Relative: 15 %
Lymphs Abs: 1.2 10*3/uL (ref 0.7–4.0)
MCH: 38.5 pg — ABNORMAL HIGH (ref 26.0–34.0)
MCHC: 35.4 g/dL (ref 30.0–36.0)
MCV: 108.8 fL — ABNORMAL HIGH (ref 80.0–100.0)
Monocytes Absolute: 0.9 10*3/uL (ref 0.1–1.0)
Monocytes Relative: 11 %
Neutro Abs: 5.9 10*3/uL (ref 1.7–7.7)
Neutrophils Relative %: 72 %
Platelets: UNDETERMINED 10*3/uL (ref 150–400)
RBC: 4.34 MIL/uL (ref 4.22–5.81)
RDW: 17.3 % — ABNORMAL HIGH (ref 11.5–15.5)
WBC: 8.1 10*3/uL (ref 4.0–10.5)
nRBC: 0 % (ref 0.0–0.2)

## 2020-11-06 LAB — I-STAT VENOUS BLOOD GAS, ED
Acid-Base Excess: 8 mmol/L — ABNORMAL HIGH (ref 0.0–2.0)
Bicarbonate: 29.1 mmol/L — ABNORMAL HIGH (ref 20.0–28.0)
Calcium, Ion: 0.55 mmol/L — CL (ref 1.15–1.40)
HCT: 54 % — ABNORMAL HIGH (ref 39.0–52.0)
Hemoglobin: 18.4 g/dL — ABNORMAL HIGH (ref 13.0–17.0)
O2 Saturation: 100 %
Potassium: >8.5 mmol/L (ref 3.5–5.1)
Sodium: 117 mmol/L — CL (ref 135–145)
TCO2: 30 mmol/L (ref 22–32)
pCO2, Ven: 31.3 mmHg — ABNORMAL LOW (ref 44.0–60.0)
pH, Ven: 7.576 — ABNORMAL HIGH (ref 7.250–7.430)
pO2, Ven: 205 mmHg — ABNORMAL HIGH (ref 32.0–45.0)

## 2020-11-06 LAB — COMPREHENSIVE METABOLIC PANEL
ALT: 145 U/L — ABNORMAL HIGH (ref 0–44)
AST: 161 U/L — ABNORMAL HIGH (ref 15–41)
Albumin: 2.7 g/dL — ABNORMAL LOW (ref 3.5–5.0)
Alkaline Phosphatase: 246 U/L — ABNORMAL HIGH (ref 38–126)
Anion gap: 18 — ABNORMAL HIGH (ref 5–15)
BUN: 5 mg/dL — ABNORMAL LOW (ref 6–20)
CO2: 27 mmol/L (ref 22–32)
Calcium: 8.5 mg/dL — ABNORMAL LOW (ref 8.9–10.3)
Chloride: 91 mmol/L — ABNORMAL LOW (ref 98–111)
Creatinine, Ser: 2.72 mg/dL — ABNORMAL HIGH (ref 0.61–1.24)
GFR, Estimated: 29 mL/min — ABNORMAL LOW (ref >60)
Glucose, Bld: 161 mg/dL — ABNORMAL HIGH (ref 70–99)
Potassium: 2.9 mmol/L — ABNORMAL LOW (ref 3.5–5.1)
Sodium: 136 mmol/L (ref 135–145)
Total Bilirubin: 9.5 mg/dL — ABNORMAL HIGH (ref 0.3–1.2)
Total Protein: 5.6 g/dL — ABNORMAL LOW (ref 6.5–8.1)

## 2020-11-06 LAB — GLUCOSE, CAPILLARY
Glucose-Capillary: 176 mg/dL — ABNORMAL HIGH (ref 70–99)
Glucose-Capillary: 168 mg/dL — ABNORMAL HIGH (ref 70–99)

## 2020-11-06 LAB — RESP PANEL BY RT-PCR (FLU A&B, COVID) ARPGX2
Influenza A by PCR: NEGATIVE
Influenza B by PCR: NEGATIVE
SARS Coronavirus 2 by RT PCR: NEGATIVE

## 2020-11-06 LAB — LACTIC ACID, PLASMA
Lactic Acid, Venous: 9.4 mmol/L (ref 0.5–1.9)
Lactic Acid, Venous: 7.2 mmol/L (ref 0.5–1.9)

## 2020-11-06 LAB — TYPE AND SCREEN
ABO/RH(D): A POS
Antibody Screen: NEGATIVE

## 2020-11-06 LAB — CBC
HCT: 47.1 % (ref 39.0–52.0)
Hemoglobin: 15.9 g/dL (ref 13.0–17.0)
MCH: 37.3 pg — ABNORMAL HIGH (ref 26.0–34.0)
MCHC: 33.8 g/dL (ref 30.0–36.0)
MCV: 110.6 fL — ABNORMAL HIGH (ref 80.0–100.0)
Platelets: 86 10*3/uL — ABNORMAL LOW (ref 150–400)
RBC: 4.26 MIL/uL (ref 4.22–5.81)
RDW: 16.7 % — ABNORMAL HIGH (ref 11.5–15.5)
WBC: 6.3 10*3/uL (ref 4.0–10.5)
nRBC: 0 % (ref 0.0–0.2)

## 2020-11-06 LAB — AMMONIA: Ammonia: 52 umol/L — ABNORMAL HIGH (ref 9–35)

## 2020-11-06 LAB — BRAIN NATRIURETIC PEPTIDE: B Natriuretic Peptide: 56.7 pg/mL (ref 0.0–100.0)

## 2020-11-06 LAB — TROPONIN I (HIGH SENSITIVITY)
Troponin I (High Sensitivity): 46 ng/L — ABNORMAL HIGH (ref <18)
Troponin I (High Sensitivity): 55 ng/L — ABNORMAL HIGH (ref <18)

## 2020-11-06 LAB — LIPASE, BLOOD: Lipase: 26 U/L (ref 11–51)

## 2020-11-06 LAB — ABO/RH: ABO/RH(D): A POS

## 2020-11-06 MED ORDER — POTASSIUM CHLORIDE CRYS ER 20 MEQ PO TBCR
40.0000 meq | EXTENDED_RELEASE_TABLET | ORAL | Status: AC
  Administered 2020-11-06 (×2): 40 meq via ORAL
  Filled 2020-11-06 (×2): qty 2

## 2020-11-06 MED ORDER — ALBUTEROL SULFATE (2.5 MG/3ML) 0.083% IN NEBU: 2.5000 mg | INHALATION_SOLUTION | RESPIRATORY_TRACT | Status: DC | PRN

## 2020-11-06 MED ORDER — MAGNESIUM SULFATE 2 GM/50ML IV SOLN
2.0000 g | Freq: Once | INTRAVENOUS | Status: AC
  Administered 2020-11-06: 2 g via INTRAVENOUS
  Filled 2020-11-06: qty 50

## 2020-11-06 MED ORDER — SODIUM CHLORIDE 0.9 % IV SOLN
250.0000 mL | INTRAVENOUS | Status: DC
  Administered 2020-11-10: 250 mL via INTRAVENOUS

## 2020-11-06 MED ORDER — ALBUTEROL (5 MG/ML) CONTINUOUS INHALATION SOLN
10.0000 mg/h | INHALATION_SOLUTION | Freq: Once | RESPIRATORY_TRACT | Status: AC
  Administered 2020-11-06: 10 mg/h via RESPIRATORY_TRACT
  Filled 2020-11-06 (×2): qty 20

## 2020-11-06 MED ORDER — SPIRITUS FRUMENTI
1.0000 | Freq: Three times a day (TID) | ORAL | Status: DC
  Filled 2020-11-06 (×3): qty 1

## 2020-11-06 MED ORDER — NICOTINE 21 MG/24HR TD PT24
21.0000 mg | MEDICATED_PATCH | Freq: Every day | TRANSDERMAL | Status: DC
  Administered 2020-11-06 – 2020-11-10 (×5): 21 mg via TRANSDERMAL
  Filled 2020-11-06 (×5): qty 1

## 2020-11-06 MED ORDER — NOREPINEPHRINE 4 MG/250ML-% IV SOLN
2.0000 ug/min | INTRAVENOUS | Status: DC
  Administered 2020-11-06: 2 ug/min via INTRAVENOUS
  Administered 2020-11-09: 40 ug/min via INTRAVENOUS
  Administered 2020-11-09: 4 ug/min via INTRAVENOUS
  Filled 2020-11-06: qty 500
  Filled 2020-11-06: qty 250

## 2020-11-06 MED ORDER — GUAIFENESIN-DM 100-10 MG/5ML PO SYRP
10.0000 mL | ORAL_SOLUTION | ORAL | Status: DC | PRN
  Administered 2020-11-07 (×2): 10 mL via ORAL
  Filled 2020-11-06 (×3): qty 10

## 2020-11-06 MED ORDER — HEPARIN SODIUM (PORCINE) 5000 UNIT/ML IJ SOLN
5000.0000 [IU] | Freq: Three times a day (TID) | INTRAMUSCULAR | Status: DC
  Administered 2020-11-06 – 2020-11-10 (×12): 5000 [IU] via SUBCUTANEOUS
  Filled 2020-11-06 (×12): qty 1

## 2020-11-06 MED ORDER — THIAMINE HCL 100 MG/ML IJ SOLN
100.0000 mg | Freq: Every day | INTRAMUSCULAR | Status: DC
  Administered 2020-11-06: 100 mg via INTRAVENOUS
  Filled 2020-11-06: qty 2

## 2020-11-06 MED ORDER — PANTOPRAZOLE SODIUM 40 MG IV SOLR
40.0000 mg | Freq: Two times a day (BID) | INTRAVENOUS | Status: DC
  Administered 2020-11-06 – 2020-11-07 (×2): 40 mg via INTRAVENOUS
  Filled 2020-11-06 (×2): qty 40

## 2020-11-06 MED ORDER — SODIUM CHLORIDE 0.9 % IV SOLN: Freq: Once | INTRAVENOUS | Status: AC

## 2020-11-06 MED ORDER — PANTOPRAZOLE SODIUM 40 MG IV SOLR: 40.0000 mg | Freq: Every day | INTRAVENOUS | Status: DC

## 2020-11-06 MED ORDER — METHYLPREDNISOLONE SODIUM SUCC 125 MG IJ SOLR
125.0000 mg | Freq: Once | INTRAMUSCULAR | Status: AC
  Administered 2020-11-06: 125 mg via INTRAVENOUS
  Filled 2020-11-06: qty 2

## 2020-11-06 MED ORDER — POLYETHYLENE GLYCOL 3350 17 G PO PACK: 17.0000 g | PACK | Freq: Every day | ORAL | Status: DC | PRN

## 2020-11-06 MED ORDER — POTASSIUM CHLORIDE 10 MEQ/100ML IV SOLN
10.0000 meq | INTRAVENOUS | Status: AC
  Administered 2020-11-06 (×4): 10 meq via INTRAVENOUS
  Filled 2020-11-06 (×4): qty 100

## 2020-11-06 MED ORDER — FOLIC ACID 5 MG/ML IJ SOLN
1.0000 mg | Freq: Every day | INTRAMUSCULAR | Status: DC
  Administered 2020-11-06 – 2020-11-10 (×5): 1 mg via INTRAVENOUS
  Filled 2020-11-06 (×7): qty 0.2

## 2020-11-06 MED ORDER — ALBUMIN HUMAN 25 % IV SOLN
25.0000 g | Freq: Four times a day (QID) | INTRAVENOUS | Status: AC
  Administered 2020-11-06 – 2020-11-07 (×4): 25 g via INTRAVENOUS
  Filled 2020-11-06 (×7): qty 100

## 2020-11-06 MED ORDER — DOCUSATE SODIUM 100 MG PO CAPS: 100.0000 mg | ORAL_CAPSULE | Freq: Two times a day (BID) | ORAL | Status: DC | PRN

## 2020-11-06 NOTE — H&P (Addendum)
NAME:  Jesus Fuller, MRN:  [id number], DOB:  1976/04/08, LOS: 0 ADMISSION DATE:  10/23/2020, CONSULTATION DATE:  10/16/2020 REFERRING MD:  Dr. Effie Shy, CHIEF COMPLAINT:  Hypotension with syncope    History of Present Illness:  Jesus Fuller is  45 y.o. with a PMH significant for NICM with bilateral CHF, tobacco abuse, prior DVT previously on Apixaban,, HTN, ETOH abuse, and asthma who presented to the ED with complaints of persistent N/V with syncope, and later developed SOB. Per chart review patient has suffers from chronic cough and related syncope. No evidence of cardiac pauses or arrhythmias on event monitor.   On arrival vitals significant for hypotension with b/p 87/47 all other VS WNL. Vast majority of labwork pending at time of assessment. 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 PCCM will admit for further workup.   Pertinent  Medical History  NICM with bilateral CHF Tobacco abuse Prior DVT previously on Apixaban HTN ETOH abuse Asthma   Significant Hospital Events: Including procedures, antibiotic start and stop dates in addition to other pertinent events   . Admitted 4/22   Interim History / Subjective:  As above   Objective   Blood pressure (!) 78/42, pulse 90, temperature 98.2 F (36.8 C), temperature source Oral, resp. rate 12, SpO2 93 %.       No intake or output data in the 24 hours ending 11/05/2020 1625 There were no vitals filed for this visit.  Physical Examination: General: Acutely ill-appearing male in NAD. HEENT: [place]/AT, +icteric sclera, PERRL, mucous membranes pink and slightly dry. Neuro: Awake, oriented x 4. Responds to verbal stimuli. Following commands consistently. Moves all 4 extremities spontaneously. Strength 5/5 in all 4 extremities. No asterixis noted. CV: RRR, no m/g/r. PULM: Breathing even and unlabored on 4LNC. Lung fields CTAB anteriorly, diminished at bilateral bases. GI: Moderately distended,  diffuse/general TTP (nonfocal), dull to percussion with +fluid wave. Extremities: Bilateral symmetric 1+ LE edema noted. Skin: Warm/dry, diffuse erythema to chest, abdomen, BLE. ?Mild jaundice.  Labs/imaging that I have  personally reviewed:  WBC 6.3, H&H 15.9/47.1, Plt clumps CMP, Lipase,  pending LA 9.4 -> 7.2 Ammonia 52  Trop 46 BNP 56.7 VBG (iSTAT) 7.57/31.3/205/29.1 ABG (iSTAT) 7.43/43.8/185/29.6  COVID/Flu negative  CXR No active disease, loop recorder device in place  US Abdomen RUQ pending  Resolved Hospital Problem List:    Assessment & Plan:  Decompensated heart failure with hypotension Cough-induced syncope History of CHF, NICM History of CHF diagnosed 2020 with initial EF 35-40%; most recent Echo 07/2020 with EF 55-60%). Hypotensive on presentation to ED to 80s/40s. Event monitor in place. Trop/BNP unremarkable. - Goal MAP > 65 - Continue fluid resuscitation - Trend lactate - Repeat Echo - Hold home Coreg - ICU admission for pressor initiation if no improvement with fluids  Abdominal pain and distention in the setting of liver disease Volume overload with ascites, LE edema Nausea with vomiting Diarrhea Generalized abdominal pain and increasing distention over the last year. Per patient report, PCP attempted torsemide for fluid management without success. Ammonia 52. - F/u US Abdomen RUQ - PPI BID - Consider additional abdominal imaging pending [place]  COPD Home regimen includes Dulera BID, Albuterol PRN, Flonase. - Wean O2 for goal sat 88-92% - Continue home medications  EtOH abuse Tobacco abuse History of significant EtOH abuse, recently cut back to 2 fifths/week; previously drank a fifth/day. - Monitor closely for signs/symptoms of withdrawal - Monitor electrolytes (Mg, Phos  specifically), replete as indicated - Beers with meals - Consider IV medications (phenobarb) if unable to tolerate PO - EtOH cessation counseling   Best Practice:  Diet:   Oral Pain/Anxiety/Delirium protocol (if indicated): No VAP protocol (if indicated): Not indicated DVT prophylaxis: Subcutaneous Heparin and SCD GI prophylaxis: PPI Glucose control:  SSI No Central venous access:  N/A Arterial line:  N/A Foley:  N/A Mobility:  bed rest  PT consulted: N/A Last date of multidisciplinary goals of care discussion [4/22] Code Status:  full code Disposition: ICU  Labs:  CBC: Recent Labs  Lab 10/18/2020 1432 10/18/2020 1452 10/22/2020 1547  WBC 8.1  --   --   NEUTROABS PENDING  --   --   HGB 16.7 18.0* 18.4*  HCT 47.2 53.0* 54.0*  MCV 108.8*  --   --   PLT PENDING  --   --    Basic Metabolic Panel: Recent Labs  Lab 10/25/2020 1452 10/26/2020 1547  NA 136 117*  K 2.6* >8.5*   GFR: CrCl cannot be calculated (Patient's most recent lab result is older than the maximum 21 days allowed.). Recent Labs  Lab 11/01/2020 1432  WBC 8.1   Liver Function Tests: No results for input(s): AST, ALT, ALKPHOS, BILITOT, PROT, ALBUMIN in the last 168 hours. No results for input(s): LIPASE, AMYLASE in the last 168 hours. No results for input(s): AMMONIA in the last 168 hours.  ABG:    Component Value Date/Time   PHART 7.438 10/20/2020 1452   PCO2ART 43.8 11/07/2020 1452   PO2ART 185 (H) 10/22/2020 1452   HCO3 29.1 (H) 11/09/2020 1547   TCO2 30 11/02/2020 1547   O2SAT 100.0 10/21/2020 1547     Coagulation Profile: No results for input(s): INR, PROTIME in the last 168 hours.  Cardiac Enzymes: No results for input(s): CKTOTAL, CKMB, CKMBINDEX, TROPONINI in the last 168 hours.  HbA1C: No results found for: HGBA1C  CBG: No results for input(s): GLUCAP in the last 168 hours.  Review of Systems:   Review of systems completed with pertinent positives/negatives outlined in above HPI.  Past Medical History:  He,  has a past medical history of Alcoholism /alcohol abuse, Aortic atherosclerosis (HCC), Asthma, Chest pain with normal coronary angiography (10/2018),  Chronic combined systolic and diastolic CHF (congestive heart failure) (HCC), Fatty liver, Hypertension, Left leg DVT (HCC) (09/2019), NICM (nonischemic cardiomyopathy) (HCC), and Tobacco abuse.   Surgical History:   Past Surgical History:  Procedure Laterality Date  . implantable loop recorder placement  05/25/2020   Medtronic Reveal [place] model [id number] (SN [id number]) implanted by Dr Johney Frame for Alleviate HF study  . RIGHT/LEFT HEART CATH AND CORONARY ANGIOGRAPHY N/A 10/24/2018   Procedure: RIGHT/LEFT HEART CATH AND CORONARY ANGIOGRAPHY;  Surgeon: Orpah Cobb, MD;  Location: [facility] INVASIVE CV LAB;  Service: Cardiovascular;  Laterality: N/A;     Social History:   reports that he has been smoking cigarettes. He has been smoking about 1.50 packs per day. He has never used smokeless tobacco. He reports current alcohol use. He reports that he does not use drugs.   Family History:  His family history includes COPD in his mother; Cancer in his mother; Diabetes in his father.   Allergies No Known Allergies   Home Medications  Prior to Admission medications   Medication Sig Start Date End Date Taking? Authorizing Provider  acidophilus (RISAQUAD) CAPS capsule Take 1 capsule by mouth daily. 07/29/20   Marlin Canary U, DO  albuterol (PROVENTIL) (2.5 MG/3ML) 0.083% nebulizer  solution USE 1 NEB (2.5 MG TOTAL) BY NEBULIZATION EVERY TWO HOURS AS NEEDED FOR WHEEZING. 07/28/20 07/28/21  Darlin Drop, DO  albuterol (VENTOLIN HFA) 108 (90 Base) MCG/ACT inhaler INHALE 2 PUFFS INTO THE LUNGS EVERY SIX HOURS AS NEEDED FOR WHEEZING OR SHORTNESS OF BREATH. 11/05/20 11/05/21  Darlin Drop, DO  calcium carbonate (OS-CAL - DOSED IN MG OF ELEMENTAL CALCIUM) 1250 (500 Ca) MG tablet Take 1 tablet (500 mg of elemental calcium total) by mouth 3 (three) times daily with meals for 7 days. 07/28/20 08/04/20  Darlin Drop, DO  calcium carbonate (TUMS - DOSED IN MG ELEMENTAL CALCIUM) 500 MG chewable tablet TAKE 1 TABLET (500 MG OF  ELEMENTAL CALCIUM TOTAL) BY MOUTH THREE TIMES DAILY WITH MEALS FOR 7 DAYS. 07/28/20 07/28/21  Darlin Drop, DO  carvedilol (COREG) 12.5 MG tablet TAKE 1 TABLET (12.5 MG TOTAL) BY MOUTH TWO TIMES DAILY. 07/28/20 07/28/21  Darlin Drop, DO  fluticasone (FLONASE) 50 MCG/ACT nasal spray PLACE 1 SPRAY INTO BOTH NOSTRILS TWO TIMES DAILY. 07/28/20 07/28/21  Darlin Drop, DO  folic acid (FOLVITE) 1 MG tablet TAKE 1 TABLET (1 MG TOTAL) BY MOUTH DAILY. 07/28/20 07/28/21  Darlin Drop, DO  gabapentin (NEURONTIN) 100 MG capsule TAKE 2 CAPSULES (200 MG TOTAL) BY MOUTH THREE TIMES DAILY. 07/28/20 07/28/21  Darlin Drop, DO  hydrocortisone (ANUSOL-HC) 2.5 % rectal cream PLACE RECTALLY TWO TIMES DAILY AS NEEDED FOR HEMORRHOIDS. 07/28/20 07/28/21  Darlin Drop, DO  mometasone-formoterol (DULERA) 100-5 MCG/ACT AERO INHALE 2 PUFFS INTO THE LUNGS TWO TIMES DAILY. 07/28/20 07/28/21  Darlin Drop, DO  Multiple Vitamins-Minerals (CERTAVITE/ANTIOXIDANTS) TABS TAKE 1 TABLET BY MOUTH FOR DAILY 07/28/20 07/28/21  Darlin Drop, DO  nicotine (NICODERM CQ - DOSED IN MG/24 HOURS) 21 mg/24hr patch PLACE 1 PATCH (21 MG TOTAL) ONTO THE SKIN DAILY. 07/28/20 07/28/21  Darlin Drop, DO  pantoprazole (PROTONIX) 40 MG tablet TAKE 1 TABLET (40 MG TOTAL) BY MOUTH DAILY AT 12 NOON. 07/28/20 07/28/21  Darlin Drop, DO  potassium & sodium phosphates (PHOS-NAK) 280-160-250 MG PACK TAKE 2 PACKETS BY MOUTH 4 (FOUR) TIMES DAILY - BEFORE MEALS AND AT BEDTIME FOR 7 DAYS. 07/28/20 07/28/21  Darlin Drop, DO  potassium chloride SA (KLOR-CON) 20 MEQ tablet TAKE 2 TABLETS (40 MEQ TOTAL) BY MOUTH DAILY WHEN YOU TAKE TORSEMIDE 20 MG 07/28/20 07/28/21  Darlin Drop, DO  thiamine 100 MG tablet TAKE 1 TABLET (100 MG TOTAL) BY MOUTH DAILY. 07/28/20 07/28/21  Darlin Drop, DO  torsemide (DEMADEX) 20 MG tablet TAKE 1 TABLET (20 MG TOTAL) BY MOUTH DAILY. 08/05/20 08/05/21  Arvilla Market, DO  atorvastatin (LIPITOR) 40 MG tablet Take 1 tablet (40 mg total)  by mouth daily at 6 PM. Patient not taking: Reported on 03/27/2019 10/25/18 03/27/19  Orpah Cobb, MD  furosemide (LASIX) 40 MG tablet As needed patient may take 40 MG Lasix PRN by mouth daily as directed  per Alleviate Research HF Study PRN plan. Patient taking differently: Take 40 mg by mouth daily as needed for fluid or edema. As needed patient may take 40 MG Lasix PRN by mouth daily as directed  per Alleviate Research HF Study PRN plan. 05/25/20 07/29/20  Hillis Range, MD     Critical care time: 4 minutes   Jesus Fuller, [place] [facility] 10/22/2020 6:04 PM  Please see [email or web address] for pager details.  From 7A-7P if no response, please call [phone]  After hours, please call E-Link [phone]

## 2020-11-06 NOTE — ED Notes (Signed)
Upon entering room, Pt is SOB, grey, diaphoretic and hypotensive. Dr Effie Shy to pt room and aware of findings

## 2020-11-06 NOTE — Progress Notes (Signed)
eLink Physician-Brief Progress Note Patient Name: MACAI SISNEROS DOB: 09-25-1975 MRN: [id number]   Date of Service  11/14/2020  HPI/Events of Note  Tobacco abuse - Request for a Nicotine patch.   eICU Interventions  Plan: 1. Nicotine patch 21 mg to skin now and Q day.      Intervention Category Major Interventions: Other:  Lenell Antu 10/26/2020, 10:35 PM

## 2020-11-06 NOTE — ED Provider Notes (Signed)
[facility] EMERGENCY DEPARTMENT Provider Note   CSN: [id number] Arrival date & time: 10/23/2020  1424     History Chief Complaint  Patient presents with  . syncopal episode    Jesus Fuller is a 45 y.o. male.  HPI Patient arrives by EMS stating "I cannot breathe."  He complains of multiple things to EMS including vomiting, inability eat, abdominal pain and syncope when coughing.  He is unable to give any other history.  Level 5 caveat-acute illness    Past Medical History:  Diagnosis Date  . Alcoholism /alcohol abuse   . Aortic atherosclerosis (HCC)   . Asthma   . Chest pain with normal coronary angiography 10/2018  . Chronic combined systolic and diastolic CHF (congestive heart failure) (HCC)   . Fatty liver   . Hypertension    Takes no medicine  . Left leg DVT (HCC) 09/2019  . NICM (nonischemic cardiomyopathy) (HCC)   . Tobacco abuse     Patient Active Problem List   Diagnosis Date Noted  . Troponin level elevated   . Cough syncope   . COPD with acute exacerbation (HCC)   . Acute bronchitis 02/02/2020  . Syncope and collapse 02/02/2020  . 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 Chronic combined systolic and diastolic heart failure (HCC) 12/05/2019  . Essential hypertension 12/02/2019  . Tobacco abuse 11/10/2019    Past Surgical History:  Procedure Laterality Date  . implantable loop recorder placement  05/25/2020   [employer] Reveal [place] model [id number] (SN [id number]) implanted by Dr Johney Frame for Alleviate HF study  . RIGHT/LEFT HEART CATH AND CORONARY ANGIOGRAPHY N/A 10/24/2018   Procedure: RIGHT/LEFT HEART CATH AND CORONARY ANGIOGRAPHY;  Surgeon: Orpah Cobb, MD;  Location: [facility] INVASIVE CV LAB;  Service: Cardiovascular;  Laterality: N/A;       Family History  Problem Relation Age of Onset  . Cancer Mother   . COPD Mother   . Diabetes Father     Social History   Tobacco Use  .  Smoking status: Current Every Day Smoker    Packs/day: 1.50    Types: Cigarettes  . Smokeless tobacco: Never Used  Vaping Use  . Vaping Use: Never used  Substance Use Topics  . Alcohol use: Yes    Comment: occasional  . Drug use: No    Home Medications Prior to Admission medications   Medication Sig Start Date End Date Taking? Authorizing Provider  acidophilus (RISAQUAD) CAPS capsule Take 1 capsule by mouth daily. 07/29/20   Marlin Canary U, DO  albuterol (PROVENTIL) (2.5 MG/3ML) 0.083% nebulizer solution USE 1 NEB (2.5 MG TOTAL) BY NEBULIZATION EVERY TWO HOURS AS NEEDED FOR WHEEZING. 07/28/20 07/28/21  Darlin Drop, DO  albuterol (VENTOLIN HFA) 108 (90 Base) MCG/ACT inhaler INHALE 2 PUFFS INTO THE LUNGS EVERY SIX HOURS AS NEEDED FOR WHEEZING OR SHORTNESS OF BREATH. 11/05/20 11/05/21  Darlin Drop, DO  calcium carbonate (OS-CAL - DOSED IN MG OF ELEMENTAL CALCIUM) 1250 (500 Ca) MG tablet Take 1 tablet (500 mg of elemental calcium total) by mouth 3 (three) times daily with meals for 7 days. 07/28/20 08/04/20  Darlin Drop, DO  calcium carbonate (TUMS - DOSED IN MG ELEMENTAL CALCIUM) 500 MG chewable tablet TAKE 1 TABLET (500 MG OF ELEMENTAL CALCIUM TOTAL) BY MOUTH THREE TIMES DAILY WITH MEALS FOR 7 DAYS. 07/28/20 07/28/21  Darlin Drop, DO  carvedilol (COREG) 12.5 MG tablet TAKE 1  TABLET (12.5 MG TOTAL) BY MOUTH TWO TIMES DAILY. 07/28/20 07/28/21  Darlin Drop, DO  fluticasone (FLONASE) 50 MCG/ACT nasal spray PLACE 1 SPRAY INTO BOTH NOSTRILS TWO TIMES DAILY. 07/28/20 07/28/21  Darlin Drop, DO  folic acid (FOLVITE) 1 MG tablet TAKE 1 TABLET (1 MG TOTAL) BY MOUTH DAILY. 07/28/20 07/28/21  Darlin Drop, DO  gabapentin (NEURONTIN) 100 MG capsule TAKE 2 CAPSULES (200 MG TOTAL) BY MOUTH THREE TIMES DAILY. 07/28/20 07/28/21  Darlin Drop, DO  hydrocortisone (ANUSOL-HC) 2.5 % rectal cream PLACE RECTALLY TWO TIMES DAILY AS NEEDED FOR HEMORRHOIDS. 07/28/20 07/28/21  Darlin Drop, DO   mometasone-formoterol (DULERA) 100-5 MCG/ACT AERO INHALE 2 PUFFS INTO THE LUNGS TWO TIMES DAILY. 07/28/20 07/28/21  Darlin Drop, DO  Multiple Vitamins-Minerals (CERTAVITE/ANTIOXIDANTS) TABS TAKE 1 TABLET BY MOUTH FOR DAILY 07/28/20 07/28/21  Darlin Drop, DO  nicotine (NICODERM CQ - DOSED IN MG/24 HOURS) 21 mg/24hr patch PLACE 1 PATCH (21 MG TOTAL) ONTO THE SKIN DAILY. 07/28/20 07/28/21  Darlin Drop, DO  pantoprazole (PROTONIX) 40 MG tablet TAKE 1 TABLET (40 MG TOTAL) BY MOUTH DAILY AT 12 NOON. 07/28/20 07/28/21  Darlin Drop, DO  potassium & sodium phosphates (PHOS-NAK) 280-160-250 MG PACK TAKE 2 PACKETS BY MOUTH 4 (FOUR) TIMES DAILY - BEFORE MEALS AND AT BEDTIME FOR 7 DAYS. 07/28/20 07/28/21  Darlin Drop, DO  potassium chloride SA (KLOR-CON) 20 MEQ tablet TAKE 2 TABLETS (40 MEQ TOTAL) BY MOUTH DAILY WHEN YOU TAKE TORSEMIDE 20 MG 07/28/20 07/28/21  Darlin Drop, DO  thiamine 100 MG tablet TAKE 1 TABLET (100 MG TOTAL) BY MOUTH DAILY. 07/28/20 07/28/21  Darlin Drop, DO  torsemide (DEMADEX) 20 MG tablet TAKE 1 TABLET (20 MG TOTAL) BY MOUTH DAILY. 08/05/20 08/05/21  Arvilla Market, DO  atorvastatin (LIPITOR) 40 MG tablet Take 1 tablet (40 mg total) by mouth daily at 6 PM. Patient not taking: Reported on 03/27/2019 10/25/18 03/27/19  Orpah Cobb, MD  furosemide (LASIX) 40 MG tablet As needed patient may take 40 MG Lasix PRN by mouth daily as directed  per Alleviate Research HF Study PRN plan. Patient taking differently: Take 40 mg by mouth daily as needed for fluid or edema. As needed patient may take 40 MG Lasix PRN by mouth daily as directed  per Alleviate Research HF Study PRN plan. 05/25/20 07/29/20  Hillis Range, MD    Allergies    Patient has no known allergies.  Review of Systems   Review of Systems  Unable to perform ROS: Acuity of condition    Physical Exam Updated Vital Signs BP (!) 78/42   Pulse 90   Temp 98.2 F (36.8 C) (Oral)   Resp 12   SpO2 93%   Physical  Exam Vitals and nursing note reviewed.  Constitutional:      General: He is in acute distress.     Appearance: He is well-developed. He is obese. He is ill-appearing, toxic-appearing and diaphoretic.  HENT:     Head: Normocephalic and atraumatic.     Right Ear: External ear normal.     Left Ear: External ear normal.     Nose: No congestion or rhinorrhea.     Mouth/Throat:     Pharynx: No oropharyngeal exudate.  Eyes:     Conjunctiva/sclera: Conjunctivae normal.     Pupils: Pupils are equal, round, and reactive to light.  Neck:     Trachea: Phonation normal.  Cardiovascular:  Rate and Rhythm: Normal rate and regular rhythm.     Heart sounds: Normal heart sounds. No murmur heard.     Comments: Hypotensive Pulmonary:     Effort: Pulmonary effort is normal.     Comments: Poor air movement bilaterally, tachypneic.  No audible wheezes or rails Abdominal:     General: There is no distension.     Palpations: Abdomen is soft.     Tenderness: There is abdominal tenderness (Face, moderate).  Musculoskeletal:        General: Swelling and tenderness present. Normal range of motion.     Cervical back: Normal range of motion and neck supple.     Right lower leg: Edema present.     Left lower leg: Edema present.  Skin:    General: Skin is warm.     Coloration: Skin is not jaundiced or pale.  Neurological:     Mental Status: He is alert.     Cranial Nerves: No cranial nerve deficit.     Motor: No abnormal muscle tone.     Coordination: Coordination normal.  Psychiatric:        Attention and Perception: He is inattentive.        Mood and Affect: Mood is anxious.        Speech: Speech is delayed.        Cognition and Memory: Cognition is impaired.        Judgment: Judgment is inappropriate.     ED Results / Procedures / Treatments   Labs (all labs ordered are listed, but only abnormal results are displayed) Labs Reviewed  CBC WITH DIFFERENTIAL/PLATELET - Abnormal; Notable for  the following components:      Result Value   MCV 108.8 (*)    MCH 38.5 (*)    RDW 17.3 (*)    All other components within normal limits  I-STAT VENOUS BLOOD GAS, ED - Abnormal; Notable for the following components:   pH, Ven 7.576 (*)    pCO2, Ven 31.3 (*)    pO2, Ven 205.0 (*)    Bicarbonate 29.1 (*)    Acid-Base Excess 8.0 (*)    Sodium 117 (*)    Potassium >8.5 (*)    Calcium, Ion 0.55 (*)    HCT 54.0 (*)    Hemoglobin 18.4 (*)    All other components within normal limits  I-STAT ARTERIAL BLOOD GAS, ED - Abnormal; Notable for the following components:   pO2, Arterial 185 (*)    Bicarbonate 29.6 (*)    Acid-Base Excess 5.0 (*)    Potassium 2.6 (*)    Calcium, Ion 0.99 (*)    HCT 53.0 (*)    Hemoglobin 18.0 (*)    All other components within normal limits  RESP PANEL BY RT-PCR (FLU A&B, COVID) ARPGX2  CULTURE, BLOOD (ROUTINE X 2)  CULTURE, BLOOD (ROUTINE X 2)  COMPREHENSIVE METABOLIC PANEL  BRAIN NATRIURETIC PEPTIDE  LACTIC ACID, PLASMA  LACTIC ACID, PLASMA  BLOOD GAS, ARTERIAL  TROPONIN I (HIGH SENSITIVITY)  TROPONIN I (HIGH SENSITIVITY)    EKG EKG Interpretation  Date/Time:  Friday November 06 2020 14:27:55 EDT Ventricular Rate:  96 PR Interval:  130 QRS Duration: 100 QT Interval:  376 QTC Calculation: 476 R Axis:   100 Text Interpretation: Sinus rhythm Low voltage with right axis deviation Abnormal R-wave progression, late transition Abnormal T, consider ischemia, anterior leads Baseline wander in lead(s) II III aVF V4 V5 Since last tracing anterior t wave inversion is  new Otherwise no significant change Confirmed by Mancel Bale [phone]) on Nov 23, 2020 2:34:53 PM   Radiology DG Chest Port 1 View  Result Date: 11/23/20 CLINICAL DATA:  Shortness of breath EXAM: PORTABLE CHEST 1 VIEW COMPARISON:  07/22/2020 FINDINGS: Loop recorder device is present. The heart size and mediastinal contours are within normal limits. No focal airspace consolidation, pleural  effusion, or pneumothorax. Chronic posttraumatic deformity of the right clavicle. IMPRESSION: No active disease. Electronically Signed   By: Duanne Guess D.O.   On: 11/23/2020 15:18    Procedures .Critical Care Performed by: Mancel Bale, MD Authorized by: Mancel Bale, MD   Critical care provider statement:    Critical care time (minutes):  75   Critical care start time:  Nov 23, 2020 2:30 PM   Critical care end time:  11/07/2020 4:30 PM   Critical care time was exclusive of:  Separately billable procedures and treating other patients   Critical care was time spent personally by me on the following activities:  Blood draw for specimens, development of treatment plan with patient or surrogate, discussions with consultants, evaluation of patient's response to treatment, examination of patient, obtaining history from patient or surrogate, ordering and performing treatments and interventions, ordering and review of laboratory studies, pulse oximetry, re-evaluation of patient's condition, review of old charts and ordering and review of radiographic studies     Medications Ordered in ED Medications  potassium chloride 10 mEq in 100 mL IVPB (10 mEq Intravenous New Bag/Given 11/23/20 1544)  albuterol (PROVENTIL,VENTOLIN) solution continuous neb (10 mg/hr Nebulization Given 2020-11-23 1500)  methylPREDNISolone sodium succinate (SOLU-MEDROL) 125 mg/2 mL injection 125 mg (125 mg Intravenous Given 2020/11/23 1451)  magnesium sulfate IVPB 2 g 50 mL (0 g Intravenous Stopped 11-23-20 1557)  0.9 %  sodium chloride infusion (0 mLs Intravenous Stopped 2020/11/23 1535)  0.9 %  sodium chloride infusion ( Intravenous Stopped 11/23/20 1608)  0.9 %  sodium chloride infusion ( Intravenous New Bag/Given 11/23/20 1604)    ED Course  I have reviewed the triage vital signs and the nursing notes.  Pertinent labs & imaging results that were available during my care of the patient were reviewed by me and considered in my  medical decision making (see chart for details).  Clinical Course as of Nov 23, 2020 1618  Fri 11-23-2020  1447 He now appears more comfortable on albuterol nebulizer. [EW]  1504 Blood gas returned and is reassuring.  Potassium is low. [EW]  1542 Patient continues to look better and states he feels okay at this time.  At this time lungs now have improved air movement, and are bilaterally auscultated to the mid lower aspect. [EW]    Clinical Course User Index [EW] Mancel Bale, MD   MDM Rules/Calculators/A&P                           Patient Vitals for the past 24 hrs:  BP Temp Temp src Pulse Resp SpO2  11/23/2020 1610 (!) 78/42 -- -- 90 12 93 %  11-23-20 1600 (!) 58/46 -- -- 89 13 94 %  11-23-2020 1555 (!) 73/48 -- -- 87 (!) 26 94 %  Nov 23, 2020 1530 (!) 76/50 -- -- 92 20 95 %  2020/11/23 1515 (!) 72/40 -- -- 92 (!) 28 96 %  11/23/20 1500 (!) 87/47 -- -- 90 20 96 %  2020-11-23 1430 (!) 61/51 -- -- 100 (!) 21 96 %  2020-11-23 1429 (!) 72/44 -- -- 92 --  95 %  10/25/2020 1428 (!) 72/44 98.2 F (36.8 C) Oral 93 19 95 %     Medical Decision Making:  This patient is presenting for evaluation of multiple complaints, primarily compromised by respiratory distress on arrival with hypotension, which does require a range of treatment options, and is a complaint that involves a high risk of morbidity and mortality. The differential diagnoses include ACS, COPD exacerbation, acute respiratory illness, hypovolemia, pulmonary embolus. I decided to review old records, and in summary middle-aged male presenting with shortness of breath, and a history of COPD DVT, heart failure and cough related syncope.  I did not require additional historical information from anyone.  Clinical Laboratory Tests Ordered, included CBC, Metabolic panel and D-dimer, arterial blood gas, lactate, blood cultures, troponin. Review indicates ABG is reassuring, normal pH, PO2 elevated on nasal cannula oxygen while receiving nebulizer.   Venous blood gas obtained with different blood draw, indicates elevated potassium, sodium low, calcium low. Radiologic Tests Ordered, included chest x-ray.  I independently Visualized: Radiographic images, which show no infiltrate or edema  Cardiac Monitor Tracing which shows normal sinus rhythm    Critical Interventions-clinical evaluation, laboratory testing, medication treatment, chest x-ray, observation reassessment  After These Interventions, the Patient was reevaluated and was found with likely COPD exacerbation as primary driver for shortness of breath, weakness and possible syncope.  Patient is an ongoing cigarette smoker.  He will require period of treatment and observation prior to consideration of appropriate disposition.  Sepsis consideration: Presenting criteria: SIRS positive with heart rate greater than 90 and respiratory rate greater than 20.  Infection is a possibility however chest x-ray is normal.  Secondary sepsis consideration: White count normal, lactate elevated, persistent hypotension yes, organ failure yes  The patient is noted to have a lactate>4. With the current information available to me, I don't think the patient is in septic shock. The lactate>4, is related to OTHER SHOCK Hypovolemia/AKI.  CRITICAL CARE-yes Performed by: Mancel Bale  Nursing Notes Reviewed/ Care Coordinated Applicable Imaging Reviewed Interpretation of Laboratory Data incorporated into ED treatment   4:15 PM-Case discussed with pulmonary critical care, who will see the patient as a consultant    Final Clinical Impression(s) / ED Diagnoses Final diagnoses:  COPD exacerbation (HCC)  Hypotension, unspecified hypotension type  Syncope, unspecified syncope type  Hypokalemia   Nursing Notes Reviewed/ Care Coordinated Applicable Imaging Reviewed Interpretation of Laboratory Data incorporated into ED treatment  Plan:Admit  Rx / DC Orders ED Discharge Orders    None        Mancel Bale, MD 11/07/20 [phone]

## 2020-11-06 NOTE — ED Provider Notes (Signed)
  Physical Exam  BP (!) 78/42   Pulse 90   Temp 98.2 F (36.8 C) (Oral)   Resp 12   SpO2 93%   Physical Exam  ED Course/Procedures   Clinical Course as of 10/31/2020 1625  Fri Nov 06, 2020  1447 He now appears more comfortable on albuterol nebulizer. [EW]  1504 Blood gas returned and is reassuring.  Potassium is low. [EW]  1542 Patient continues to look better and states he feels okay at this time.  At this time lungs now have improved air movement, and are bilaterally auscultated to the mid lower aspect. [EW]    Clinical Course User Index [EW] Mancel Bale, MD    Procedures  MDM  Awaiting ICU c/s.  Patient will be admitted by the ICU team.    Koleen Distance, MD 11/08/2020 [phone]

## 2020-11-06 NOTE — ED Triage Notes (Signed)
Pt BIB [facility] d/t syncopal episode brought on by coughing, pt has a hx of this. EMS also reports pt is non-compliant for his COPD Tx. While in route to ED pt was 99% on RA did de-sat to 88%, arrived to ED on 2L n/c. Pt reports n/v/d for the last 6 days, with thick brown vomit starting 3 days ago. Pt initially came d/t hi8s inability to hold any food/fluids down. Unknown LBM, EMS reports abd rigid & painful to palpate, he has moments of that he holds his breath & has tremors & shakes (per EMS). Pt also reports he has an internal cardiac monitor. 92 bpm, 30 resp, CBG 198.

## 2020-11-06 NOTE — Progress Notes (Signed)
Labs reviewed. Differential is some combination of alcoholic, congestive hepatitis w  biliary stasis, need to see what echo/bnp shows and potentially get hida scan  Jesusita Oka

## 2020-11-06 NOTE — Progress Notes (Incomplete)
11/13/2020  I have seen and evaluated the patient for shock.  S:  45 year old with extensive drinking history presenting for recurrent syncope found to have severe hypotension in ER despite fluid boluses.  Drinks 2 fifths a week down from 1-2 fifths a day.  Has been have N/V/D for multiple days, not taking much PO.  Initial lactate 9, other labs pending.  O: Blood pressure (!) 99/53, pulse 91, temperature 98.2 F (36.8 C), temperature source Oral, resp. rate 13, SpO2 97 %.  Jaundiced chronically ill appearing man Diffuse anasarca Epigastric area mildly tender to palpation No asterixis Heart/lungs normal Answering questions appropriately  A:  Shock- I suspect he has alcoholic pancreatitis and hepatitis resulting in diffuse inflammatory reaction.  No S/S of sepsis.  Alcohol dependence  Hx of alcoholic cardiomyopathy  Diffuse total body volume overload  P:  - f/u CMP, lipase - check echo - levophed titrated to MAP 65 - beer w/ meals to present w/d, CIWA protocol ativan, thiamine, folate - if discriminant score high, start steroids  Patient critically ill due to shock Interventions to address this today pressor titration Risk of deterioration without these interventions is high  I personally spent 42 minutes providing critical care not including any separately billable procedures  Myrla Halsted MD [facility] Prefer epic messenger for cross cover needs If after hours, please call E-link

## 2020-11-06 NOTE — Progress Notes (Signed)
eLink Physician-Brief Progress Note Patient Name: Jesus Fuller DOB: 04-04-1976 MRN: [id number]   Date of Service  10/22/2020  HPI/Events of Note  Cough - Nursing request for cough suppressant.   eICU Interventions  Plan: 1. NPO except sips with meds. 2. Robitussin DM 10 mL PO Q 4 hours PRN cough. 3. Albuterol 2.5 mg via Neb Q 3 hours PRN SOB or wheezing.         Lexi Conaty Dennard Nip 11/07/2020, 11:52 PM

## 2020-11-07 ENCOUNTER — Inpatient Hospital Stay (HOSPITAL_COMMUNITY): Payer: Self-pay

## 2020-11-07 DIAGNOSIS — F10231 Alcohol dependence with withdrawal delirium: Secondary | ICD-10-CM

## 2020-11-07 DIAGNOSIS — I248 Other forms of acute ischemic heart disease: Secondary | ICD-10-CM

## 2020-11-07 LAB — COMPREHENSIVE METABOLIC PANEL
ALT: 140 U/L — ABNORMAL HIGH (ref 0–44)
AST: 160 U/L — ABNORMAL HIGH (ref 15–41)
Albumin: 3.2 g/dL — ABNORMAL LOW (ref 3.5–5.0)
Alkaline Phosphatase: 200 U/L — ABNORMAL HIGH (ref 38–126)
Anion gap: 18 — ABNORMAL HIGH (ref 5–15)
BUN: 7 mg/dL (ref 6–20)
CO2: 28 mmol/L (ref 22–32)
Calcium: 8.9 mg/dL (ref 8.9–10.3)
Chloride: 91 mmol/L — ABNORMAL LOW (ref 98–111)
Creatinine, Ser: 3.15 mg/dL — ABNORMAL HIGH (ref 0.61–1.24)
GFR, Estimated: 24 mL/min — ABNORMAL LOW (ref >60)
Glucose, Bld: 144 mg/dL — ABNORMAL HIGH (ref 70–99)
Potassium: 3.5 mmol/L (ref 3.5–5.1)
Sodium: 137 mmol/L (ref 135–145)
Total Bilirubin: 10 mg/dL — ABNORMAL HIGH (ref 0.3–1.2)
Total Protein: 6 g/dL — ABNORMAL LOW (ref 6.5–8.1)

## 2020-11-07 LAB — ECHOCARDIOGRAM COMPLETE
Area-P 1/2: 3.48 cm2
Height: 70 in
S' Lateral: 3.4 cm
Weight: 4398.62 oz

## 2020-11-07 LAB — GLUCOSE, CAPILLARY
Glucose-Capillary: 134 mg/dL — ABNORMAL HIGH (ref 70–99)
Glucose-Capillary: 138 mg/dL — ABNORMAL HIGH (ref 70–99)
Glucose-Capillary: 163 mg/dL — ABNORMAL HIGH (ref 70–99)
Glucose-Capillary: 140 mg/dL — ABNORMAL HIGH (ref 70–99)
Glucose-Capillary: 147 mg/dL — ABNORMAL HIGH (ref 70–99)
Glucose-Capillary: 142 mg/dL — ABNORMAL HIGH (ref 70–99)

## 2020-11-07 LAB — PHOSPHORUS
Phosphorus: <1 mg/dL — CL (ref 2.5–4.6)
Phosphorus: 3.6 mg/dL (ref 2.5–4.6)
Phosphorus: 4.1 mg/dL (ref 2.5–4.6)

## 2020-11-07 LAB — BASIC METABOLIC PANEL
Anion gap: 16 — ABNORMAL HIGH (ref 5–15)
BUN: 10 mg/dL (ref 6–20)
CO2: 29 mmol/L (ref 22–32)
Calcium: 8.6 mg/dL — ABNORMAL LOW (ref 8.9–10.3)
Chloride: 94 mmol/L — ABNORMAL LOW (ref 98–111)
Creatinine, Ser: 3.26 mg/dL — ABNORMAL HIGH (ref 0.61–1.24)
GFR, Estimated: 23 mL/min — ABNORMAL LOW (ref >60)
Glucose, Bld: 138 mg/dL — ABNORMAL HIGH (ref 70–99)
Potassium: 3.6 mmol/L (ref 3.5–5.1)
Sodium: 139 mmol/L (ref 135–145)

## 2020-11-07 LAB — CBC
HCT: 45.4 % (ref 39.0–52.0)
Hemoglobin: 15.4 g/dL (ref 13.0–17.0)
MCH: 37.6 pg — ABNORMAL HIGH (ref 26.0–34.0)
MCHC: 33.9 g/dL (ref 30.0–36.0)
MCV: 110.7 fL — ABNORMAL HIGH (ref 80.0–100.0)
Platelets: 90 10*3/uL — ABNORMAL LOW (ref 150–400)
RBC: 4.1 MIL/uL — ABNORMAL LOW (ref 4.22–5.81)
RDW: 16.8 % — ABNORMAL HIGH (ref 11.5–15.5)
WBC: 8 10*3/uL (ref 4.0–10.5)
nRBC: 0 % (ref 0.0–0.2)

## 2020-11-07 LAB — D-DIMER, QUANTITATIVE: D-Dimer, Quant: 1.4 ug/mL-FEU — ABNORMAL HIGH (ref 0.00–0.50)

## 2020-11-07 LAB — MRSA PCR SCREENING: MRSA by PCR: NEGATIVE

## 2020-11-07 LAB — PROTIME-INR
INR: 1.1 (ref 0.8–1.2)
Prothrombin Time: 14.6 seconds (ref 11.4–15.2)

## 2020-11-07 LAB — MAGNESIUM: Magnesium: 1.1 mg/dL — ABNORMAL LOW (ref 1.7–2.4)

## 2020-11-07 LAB — BRAIN NATRIURETIC PEPTIDE: B Natriuretic Peptide: 175.6 pg/mL — ABNORMAL HIGH (ref 0.0–100.0)

## 2020-11-07 MED ORDER — LACTATED RINGERS IV BOLUS
1000.0000 mL | Freq: Once | INTRAVENOUS | Status: AC
  Administered 2020-11-07: 1000 mL via INTRAVENOUS

## 2020-11-07 MED ORDER — ADULT MULTIVITAMIN W/MINERALS CH: 1.0000 | ORAL_TABLET | Freq: Every day | ORAL | Status: DC

## 2020-11-07 MED ORDER — LACTATED RINGERS IV SOLN: INTRAVENOUS | Status: AC

## 2020-11-07 MED ORDER — CHLORHEXIDINE GLUCONATE CLOTH 2 % EX PADS
6.0000 | MEDICATED_PAD | Freq: Every day | CUTANEOUS | Status: DC
  Administered 2020-11-07 – 2020-11-10 (×3): 6 via TOPICAL

## 2020-11-07 MED ORDER — MAGNESIUM SULFATE 2 GM/50ML IV SOLN
2.0000 g | Freq: Once | INTRAVENOUS | Status: AC
  Administered 2020-11-07: 2 g via INTRAVENOUS
  Filled 2020-11-07: qty 50

## 2020-11-07 MED ORDER — LORAZEPAM 2 MG/ML IJ SOLN
1.0000 mg | INTRAMUSCULAR | Status: DC | PRN
  Administered 2020-11-07: 3 mg via INTRAVENOUS
  Administered 2020-11-07 (×2): 4 mg via INTRAVENOUS
  Administered 2020-11-07: 2 mg via INTRAVENOUS
  Administered 2020-11-07: 4 mg via INTRAVENOUS
  Administered 2020-11-07 (×2): 2 mg via INTRAVENOUS
  Administered 2020-11-08: 4 mg via INTRAVENOUS
  Administered 2020-11-08 (×2): 2 mg via INTRAVENOUS
  Administered 2020-11-08: 4 mg via INTRAVENOUS
  Administered 2020-11-08: 2 mg via INTRAVENOUS
  Administered 2020-11-08 (×2): 4 mg via INTRAVENOUS
  Administered 2020-11-08 (×3): 2 mg via INTRAVENOUS
  Administered 2020-11-09: 4 mg via INTRAVENOUS
  Filled 2020-11-07: qty 2
  Filled 2020-11-07: qty 1
  Filled 2020-11-07: qty 2
  Filled 2020-11-07 (×3): qty 1
  Filled 2020-11-07 (×3): qty 2
  Filled 2020-11-07: qty 1
  Filled 2020-11-07: qty 2
  Filled 2020-11-07: qty 1
  Filled 2020-11-07: qty 2
  Filled 2020-11-07 (×2): qty 1
  Filled 2020-11-07: qty 2
  Filled 2020-11-07: qty 1
  Filled 2020-11-07: qty 2

## 2020-11-07 MED ORDER — PANTOPRAZOLE SODIUM 40 MG IV SOLR
40.0000 mg | INTRAVENOUS | Status: DC
  Administered 2020-11-08: 40 mg via INTRAVENOUS
  Filled 2020-11-07: qty 40

## 2020-11-07 MED ORDER — THIAMINE HCL 100 MG PO TABS: 100.0000 mg | ORAL_TABLET | Freq: Every day | ORAL | Status: DC

## 2020-11-07 MED ORDER — DEXMEDETOMIDINE HCL IN NACL 400 MCG/100ML IV SOLN
0.4000 ug/kg/h | INTRAVENOUS | Status: DC
Start: 2020-11-07 — End: 2020-11-11
  Administered 2020-11-07 (×2): 0.4 ug/kg/h via INTRAVENOUS
  Administered 2020-11-07: 0.5 ug/kg/h via INTRAVENOUS
  Administered 2020-11-08: 0.9 ug/kg/h via INTRAVENOUS
  Administered 2020-11-08: 0.7 ug/kg/h via INTRAVENOUS
  Administered 2020-11-08 (×2): 0.9 ug/kg/h via INTRAVENOUS
  Administered 2020-11-08: 1.2 ug/kg/h via INTRAVENOUS
  Administered 2020-11-08: 0.9 ug/kg/h via INTRAVENOUS
  Administered 2020-11-08: 0.4 ug/kg/h via INTRAVENOUS
  Administered 2020-11-09 (×3): 1.2 ug/kg/h via INTRAVENOUS
  Administered 2020-11-09: 0.4 ug/kg/h via INTRAVENOUS
  Administered 2020-11-09 (×2): 1.2 ug/kg/h via INTRAVENOUS
  Administered 2020-11-10: 0.5 ug/kg/h via INTRAVENOUS
  Administered 2020-11-10 (×6): 1.2 ug/kg/h via INTRAVENOUS
  Filled 2020-11-07 (×3): qty 100
  Filled 2020-11-07: qty 200
  Filled 2020-11-07 (×6): qty 100
  Filled 2020-11-07: qty 200
  Filled 2020-11-07 (×9): qty 100
  Filled 2020-11-07: qty 200

## 2020-11-07 MED ORDER — LORAZEPAM 1 MG PO TABS: 1.0000 mg | ORAL_TABLET | ORAL | Status: DC | PRN

## 2020-11-07 MED ORDER — THIAMINE HCL 100 MG/ML IJ SOLN
100.0000 mg | Freq: Every day | INTRAMUSCULAR | Status: DC
  Administered 2020-11-07 – 2020-11-09 (×3): 100 mg via INTRAVENOUS
  Filled 2020-11-07 (×3): qty 2

## 2020-11-07 MED ORDER — POTASSIUM PHOSPHATES 15 MMOLE/5ML IV SOLN
30.0000 mmol | Freq: Once | INTRAVENOUS | Status: AC
  Administered 2020-11-07: 30 mmol via INTRAVENOUS
  Filled 2020-11-07: qty 10

## 2020-11-07 NOTE — Progress Notes (Signed)
Starting to hallucinate and see the [place] flag and the [place] on the ceiling. Notified eLink, awaiting orders.  Esperanza Sheets, RN 11/07/20 4:46 AM

## 2020-11-07 NOTE — Progress Notes (Signed)
eLink Physician-Brief Progress Note Patient Name: Jesus Fuller DOB: [birth year] MRN: [id number]   Date of Service  11/07/2020  HPI/Events of Note  Nursing reports that the patient is starting to hallucinate. No CIWA ordered.   eICU Interventions  Will order Stepdown CIWA protocol.      Intervention Category Major Interventions: Change in mental status - evaluation and management  Jordayn Mink Eugene 11/07/2020, 4:55 AM

## 2020-11-07 NOTE — Progress Notes (Signed)
Notified eLink on call for coughing that transitioned into decorticate movements that lasted about 15 seconds, that transitioned to yelling out "don't let her fall, catch her", that transitioned to him being back to baseline A&Ox4. This episode from coughing to back to baseline, lasted a total of one to one and a half minutes. This RN was at bedside from start to finish. Esperanza Sheets, RN 10/16/2020 2350

## 2020-11-07 NOTE — Progress Notes (Signed)
eLink Physician-Brief Progress Note Patient Name: JOCOB DAMBACH DOB: 02-Jun-1976 MRN: [id number]   Date of Service  11/07/2020  HPI/Events of Note  Hypokalemia  Hypophosphatemia  Hypomagnesemia - K+ = 3.5, PO4--- = < 1, Mg++ = 1.1 and Creatinine = 3.15.   eICU Interventions  Will replace K+, PO4--- and Mg++.     Intervention Category Major Interventions: Electrolyte abnormality - evaluation and management  Clarise Chacko Eugene 11/07/2020, 4:25 AM

## 2020-11-07 NOTE — Progress Notes (Signed)
NAME:  Jesus Fuller, MRN:  [id number], DOB:  1975-11-23, LOS: 1 ADMISSION DATE:  10/23/2020, CONSULTATION DATE:  11/07/2020 REFERRING MD:  Dr. Effie Shy, CHIEF COMPLAINT:  Hypotension with syncope    History of Present Illness:  Jesus Fuller is  45 y.o. with a PMH significant for NICM with bilateral CHF, tobacco abuse, prior DVT previously on Apixaban,, HTN, ETOH abuse, and asthma who presented to the ED with complaints of persistent N/V with syncope, and later developed SOB. Per chart review patient has suffers from chronic cough and related syncope. No evidence of cardiac pauses or arrhythmias on event monitor.   On arrival vitals significant for hypotension with b/p 87/47 all other VS WNL. Vast majority of labwork pending at time of assessment. 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 PCCM will admit for further workup.   Pertinent  Medical History  NICM with bilateral CHF Tobacco abuse Prior DVT previously on Apixaban HTN ETOH abuse Asthma   Significant Hospital Events: Including procedures, antibiotic start and stop dates in addition to other pertinent events   . Admitted 4/22  . 4/23 progressive agitation, CIWA scores elevated, getting ativan, precedex ordered  Interim History / Subjective:  Withdrawing from EtOH, Bps better  Objective   Blood pressure 108/75, pulse 75, temperature 98.2 F (36.8 C), temperature source Oral, resp. rate (!) 28, height 5\' 10"  (1.778 m), weight 124.7 kg, SpO2 91 %.        Intake/Output Summary (Last 24 hours) at 11/07/2020 1039 Last data filed at 11/07/2020 1000 Gross per 24 hour  Intake 762.29 ml  Output 50 ml  Net 712.29 ml   Filed Weights   11/04/2020 2113 11/07/20 0500  Weight: 124 kg 124.7 kg    Physical Examination: General: Acutely ill-appearing male in NAD. HEENT: [place]/AT, +icteric sclera, PERRL, mucous membranes pink and slightly dry. Neuro: Awake, oriented x 4. Responds to verbal  stimuli. Following commands consistently. Moves all 4 extremities spontaneously.  CV: RRR, no m/g/r. PULM: Breathing even and unlabored on [place]. Lung fields CTAB anteriorly, diminished at bilateral bases. GI: Moderately distended, NT Extremities: Bilateral symmetric 1+ LE edema noted. Skin: Warm/dry, diffuse erythema to chest, abdomen, BLE. ?Mild jaundice.  Labs/imaging that I have  personally reviewed:  CBC, chemistries, CXR  Resolved Hospital Problem List:    Assessment & Plan:  Decompensated heart failure with hypotension - improved Cough-induced syncope History of CHF, NICM likely related to EtOH History of CHF diagnosed 2020 with initial EF 35-40%; most recent Echo 07/2020 with EF 55-60%). Hypotensive on presentation to ED to 80s/40s. Event monitor in place. Trop/BNP unremarkable. - Trend lactate - trending down but elevated - Repeat Echo - Hold home Coreg  Abdominal pain and distention in the setting of liver disease Volume overload with ascites, LE edema Nausea with vomiting Diarrhea Generalized abdominal pain and increasing distention over the last year. Per patient report, PCP attempted torsemide for fluid management without success. Ammonia 52. - RUQ 08/2020 with liver surface irregularity, no acute findings (EtOH, venous congestion) - PPI   COPD Home regimen includes Dulera BID, Albuterol PRN, Flonase. - Wean O2 for goal sat 88-92% - Continue home medications  EtOH abuse now with EtOH withdwrawal Tobacco abuse History of significant EtOH abuse, recently cut back to 2 fifths/week; previously drank a fifth/day. - CIWA with IV ativan - Precedex  Acute renal failure: Oliguric, Cr rising. --Renal US --Consider IVF, peripherally volume up but dry MM  and now withdrawing likely wont take PO   Best Practice:  Diet:  Oral Pain/Anxiety/Delirium protocol (if indicated): No VAP protocol (if indicated): Not indicated DVT prophylaxis: Subcutaneous Heparin and SCD GI  prophylaxis: PPI Glucose control:  SSI No Central venous access:  N/A Arterial line:  N/A Foley:  N/A Mobility:  bed rest  PT consulted: N/A Last date of multidisciplinary goals of care discussion [4/22] Code Status:  full code Disposition: ICU   Critical care time:    CRITICAL CARE Performed by: Karren Burly   Total critical care time: 40 minutes  Critical care time was exclusive of separately billable procedures and treating other patients.  Critical care was necessary to treat or prevent imminent or life-threatening deterioration.  Critical care was time spent personally by me on the following activities: development of treatment plan with patient and/or surrogate as well as nursing, discussions with consultants, evaluation of patient's response to treatment, examination of patient, obtaining history from patient or surrogate, ordering and performing treatments and interventions, ordering and review of laboratory studies, ordering and review of radiographic studies, pulse oximetry and re-evaluation of patient's condition.   Karren Burly, MD [facility] 11/07/20 10:39 AM  Please see [email or web address] for pager details.  From 7A-7P if no response, please call [phone] After hours, please call E-Link [phone]

## 2020-11-07 NOTE — Progress Notes (Signed)
  Echocardiogram 2D Echocardiogram has been performed.  Delcie Roch 11/07/2020, 1:31 PM

## 2020-11-08 LAB — GLUCOSE, CAPILLARY
Glucose-Capillary: 130 mg/dL — ABNORMAL HIGH (ref 70–99)
Glucose-Capillary: 111 mg/dL — ABNORMAL HIGH (ref 70–99)
Glucose-Capillary: 97 mg/dL (ref 70–99)
Glucose-Capillary: 97 mg/dL (ref 70–99)
Glucose-Capillary: 96 mg/dL (ref 70–99)
Glucose-Capillary: 105 mg/dL — ABNORMAL HIGH (ref 70–99)

## 2020-11-08 LAB — BASIC METABOLIC PANEL
Anion gap: 13 (ref 5–15)
BUN: 12 mg/dL (ref 6–20)
CO2: 31 mmol/L (ref 22–32)
Calcium: 8.2 mg/dL — ABNORMAL LOW (ref 8.9–10.3)
Chloride: 95 mmol/L — ABNORMAL LOW (ref 98–111)
Creatinine, Ser: 3.39 mg/dL — ABNORMAL HIGH (ref 0.61–1.24)
GFR, Estimated: 22 mL/min — ABNORMAL LOW (ref >60)
Glucose, Bld: 121 mg/dL — ABNORMAL HIGH (ref 70–99)
Potassium: 3.3 mmol/L — ABNORMAL LOW (ref 3.5–5.1)
Sodium: 139 mmol/L (ref 135–145)

## 2020-11-08 LAB — MAGNESIUM: Magnesium: 2.1 mg/dL (ref 1.7–2.4)

## 2020-11-08 LAB — PHOSPHORUS: Phosphorus: 3.7 mg/dL (ref 2.5–4.6)

## 2020-11-08 MED ORDER — POTASSIUM CHLORIDE 10 MEQ/100ML IV SOLN
10.0000 meq | INTRAVENOUS | Status: AC
  Administered 2020-11-08 (×4): 10 meq via INTRAVENOUS
  Filled 2020-11-08 (×4): qty 100

## 2020-11-08 MED ORDER — POTASSIUM CHLORIDE CRYS ER 20 MEQ PO TBCR: 40.0000 meq | EXTENDED_RELEASE_TABLET | Freq: Once | ORAL | Status: DC

## 2020-11-08 MED ORDER — ALBUMIN HUMAN 25 % IV SOLN
25.0000 g | Freq: Once | INTRAVENOUS | Status: AC
  Administered 2020-11-08: 25 g via INTRAVENOUS
  Filled 2020-11-08: qty 100

## 2020-11-08 MED ORDER — LACTATED RINGERS IV SOLN: INTRAVENOUS | Status: AC

## 2020-11-08 NOTE — Progress Notes (Signed)
eLink Physician-Brief Progress Note Patient Name: ABDULKADIR EMMANUEL DOB: 1975/07/29 MRN: [id number]   Date of Service  11/08/2020  HPI/Events of Note  Patient reportedly agitated earlier but appears calm right now when I camera'd into the room. SBP 79/54 with MAP 61 mmHg.  eICU Interventions  Will give 25 gm of 25 % Albumin x 1, no indication for incremental sedation at this time.        Thomasene Lot Scarleth Brame 11/08/2020, 11:30 PM

## 2020-11-08 NOTE — Progress Notes (Signed)
NAME:  Jesus Fuller, MRN:  [id number], DOB:  11/10/1975, LOS: 2 ADMISSION DATE:  2020-11-29, CONSULTATION DATE:  11/29/2020 REFERRING MD:  Dr. Effie Shy, CHIEF COMPLAINT:  Hypotension with syncope    History of Present Illness:  Jesus Fuller is  45 y.o. with a PMH significant for NICM with bilateral CHF, tobacco abuse, prior DVT previously on Apixaban,, HTN, ETOH abuse, and asthma who presented to the ED with complaints of persistent N/V with syncope, and later developed SOB. Per chart review patient has suffers from chronic cough and related syncope. No evidence of cardiac pauses or arrhythmias on event monitor.   On arrival vitals significant for hypotension with b/p 87/47 all other VS WNL. Vast majority of labwork pending at time of assessment. 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 PCCM will admit for further workup.   Pertinent  Medical History  NICM with bilateral CHF Tobacco abuse Prior DVT previously on Apixaban HTN ETOH abuse Asthma   Significant Hospital Events: Including procedures, antibiotic start and stop dates in addition to other pertinent events   . Admitted 4/22  . 4/23 progressive agitation, CIWA scores elevated, getting ativan, precedex ordered  Interim History / Subjective:  On Ativan and precedex, Cr plateau?  Objective   Blood pressure 101/60, pulse 76, temperature 99.2 F (37.3 C), temperature source Axillary, resp. rate (!) 27, height 5\' 10"  (1.778 m), weight 126.7 kg, SpO2 95 %.        Intake/Output Summary (Last 24 hours) at 11/08/2020 1001 Last data filed at 11/08/2020 0900 Gross per 24 hour  Intake 1590.26 ml  Output 250 ml  Net 1340.26 ml   Filed Weights   11-29-2020 2113 11/07/20 0500 11/08/20 0432  Weight: 124 kg 124.7 kg 126.7 kg    Physical Examination: General: Acutely ill-appearing male in NAD. HEENT: [place]/AT, +icteric sclera, PERRL, mucous membranes pink and slightly dry. Neuro: Awake,  oriented x 4. Responds to verbal stimuli. Following commands consistently. Moves all 4 extremities spontaneously.  CV: RRR, no m/g/r. PULM: Breathing even and unlabored on NRB. Lung fields CTAB anteriorly, diminished at bilateral bases. GI: Moderately distended, NT Extremities: Bilateral symmetric 1+ LE edema noted. Skin: Warm/dry, diffuse erythema to chest, abdomen, BLE. ?Mild jaundice.  Labs/imaging that I have  personally reviewed:  CBC, chemistries, CXR  Resolved Hospital Problem List:    Assessment & Plan:  Decompensated heart failure with hypotension - improved Cough-induced syncope History of CHF, NICM likely related to EtOH History of CHF diagnosed 2020 with initial EF 35-40%; most recent Echo 07/2020 with EF 55-60%). Hypotensive on presentation to ED to 80s/40s. Event monitor in place. Trop/BNP unremarkable. - Repeat Echo with normal EF - Hold home Coreg  Abdominal pain and distention in the setting of liver disease Volume overload with ascites, LE edema Nausea with vomiting Diarrhea Generalized abdominal pain and increasing distention over the last year. Per patient report, PCP attempted torsemide for fluid management without success. Ammonia 52. - RUQ 08/2020 with liver surface irregularity, no acute findings (EtOH, venous congestion) - PPI   COPD Home regimen includes Dulera BID, Albuterol PRN, Flonase. - Wean O2 for goal sat 88-92% - Continue home medications  EtOH abuse now with EtOH withdwrawal Tobacco abuse History of significant EtOH abuse, recently cut back to 2 fifths/week; previously drank a fifth/day. - CIWA with IV ativan - Precedex  Acute renal failure: Oliguric, Cr rising,maybe reaching plateau --Renal US --Continue MIVF, peripherally volume up but  dry MM and now withdrawing likely wont take PO   Best Practice:  Diet:  Oral Pain/Anxiety/Delirium protocol (if indicated): No VAP protocol (if indicated): Not indicated DVT prophylaxis: Subcutaneous  Heparin and SCD GI prophylaxis: PPI Glucose control:  SSI No Central venous access:  N/A Arterial line:  N/A Foley:  N/A Mobility:  bed rest  PT consulted: N/A Last date of multidisciplinary goals of care discussion [4/22] Code Status:  full code Disposition: ICU   Critical care time:    CRITICAL CARE Performed by: Karren Burly   Total critical care time: 31 minutes  Critical care time was exclusive of separately billable procedures and treating other patients.  Critical care was necessary to treat or prevent imminent or life-threatening deterioration.  Critical care was time spent personally by me on the following activities: development of treatment plan with patient and/or surrogate as well as nursing, discussions with consultants, evaluation of patient's response to treatment, examination of patient, obtaining history from patient or surrogate, ordering and performing treatments and interventions, ordering and review of laboratory studies, ordering and review of radiographic studies, pulse oximetry and re-evaluation of patient's condition.   Karren Burly, MD [facility] 11/08/20 10:01 AM  Please see [email or web address] for pager details.  From 7A-7P if no response, please call [phone] After hours, please call E-Link [phone]

## 2020-11-09 ENCOUNTER — Inpatient Hospital Stay (HOSPITAL_COMMUNITY): Payer: Self-pay

## 2020-11-09 DIAGNOSIS — J9601 Acute respiratory failure with hypoxia: Secondary | ICD-10-CM

## 2020-11-09 DIAGNOSIS — I469 Cardiac arrest, cause unspecified: Secondary | ICD-10-CM

## 2020-11-09 DIAGNOSIS — N179 Acute kidney failure, unspecified: Secondary | ICD-10-CM

## 2020-11-09 DIAGNOSIS — R6521 Severe sepsis with septic shock: Secondary | ICD-10-CM

## 2020-11-09 DIAGNOSIS — A419 Sepsis, unspecified organism: Secondary | ICD-10-CM

## 2020-11-09 LAB — POCT I-STAT 7, (LYTES, BLD GAS, ICA,H+H)
Acid-Base Excess: 0 mmol/L (ref 0.0–2.0)
Acid-Base Excess: 1 mmol/L (ref 0.0–2.0)
Acid-Base Excess: 1 mmol/L (ref 0.0–2.0)
Bicarbonate: 33.9 mmol/L — ABNORMAL HIGH (ref 20.0–28.0)
Bicarbonate: 32.6 mmol/L — ABNORMAL HIGH (ref 20.0–28.0)
Bicarbonate: 31.8 mmol/L — ABNORMAL HIGH (ref 20.0–28.0)
Calcium, Ion: 1.06 mmol/L — ABNORMAL LOW (ref 1.15–1.40)
Calcium, Ion: 0.99 mmol/L — ABNORMAL LOW (ref 1.15–1.40)
Calcium, Ion: 1.01 mmol/L — ABNORMAL LOW (ref 1.15–1.40)
HCT: 53 % — ABNORMAL HIGH (ref 39.0–52.0)
HCT: 49 % (ref 39.0–52.0)
HCT: 51 % (ref 39.0–52.0)
Hemoglobin: 18 g/dL — ABNORMAL HIGH (ref 13.0–17.0)
Hemoglobin: 16.7 g/dL (ref 13.0–17.0)
Hemoglobin: 17.3 g/dL — ABNORMAL HIGH (ref 13.0–17.0)
O2 Saturation: 75 %
O2 Saturation: 75 %
O2 Saturation: 76 %
Patient temperature: 99.1
Patient temperature: 99
Potassium: 3.7 mmol/L (ref 3.5–5.1)
Potassium: 3.5 mmol/L (ref 3.5–5.1)
Potassium: 3.9 mmol/L (ref 3.5–5.1)
Sodium: 139 mmol/L (ref 135–145)
Sodium: 139 mmol/L (ref 135–145)
Sodium: 140 mmol/L (ref 135–145)
TCO2: 37 mmol/L — ABNORMAL HIGH (ref 22–32)
TCO2: 35 mmol/L — ABNORMAL HIGH (ref 22–32)
TCO2: 34 mmol/L — ABNORMAL HIGH (ref 22–32)
pCO2 arterial: 106.6 mmHg (ref 32.0–48.0)
pCO2 arterial: 84 mmHg (ref 32.0–48.0)
pCO2 arterial: 79.7 mmHg (ref 32.0–48.0)
pH, Arterial: 7.112 — CL (ref 7.350–7.450)
pH, Arterial: 7.198 — CL (ref 7.350–7.450)
pH, Arterial: 7.208 — ABNORMAL LOW (ref 7.350–7.450)
pO2, Arterial: 58 mmHg — ABNORMAL LOW (ref 83.0–108.0)
pO2, Arterial: 52 mmHg — ABNORMAL LOW (ref 83.0–108.0)
pO2, Arterial: 51 mmHg — ABNORMAL LOW (ref 83.0–108.0)

## 2020-11-09 LAB — GLUCOSE, CAPILLARY
Glucose-Capillary: 102 mg/dL — ABNORMAL HIGH (ref 70–99)
Glucose-Capillary: 87 mg/dL (ref 70–99)
Glucose-Capillary: 90 mg/dL (ref 70–99)
Glucose-Capillary: 56 mg/dL — ABNORMAL LOW (ref 70–99)
Glucose-Capillary: 85 mg/dL (ref 70–99)
Glucose-Capillary: 83 mg/dL (ref 70–99)

## 2020-11-09 LAB — MAGNESIUM: Magnesium: 2.1 mg/dL (ref 1.7–2.4)

## 2020-11-09 LAB — URINALYSIS, MICROSCOPIC (REFLEX)

## 2020-11-09 LAB — CBC
HCT: 48.7 % (ref 39.0–52.0)
HCT: 49.3 % (ref 39.0–52.0)
Hemoglobin: 15.2 g/dL (ref 13.0–17.0)
Hemoglobin: 14.8 g/dL (ref 13.0–17.0)
MCH: 37.6 pg — ABNORMAL HIGH (ref 26.0–34.0)
MCH: 37.5 pg — ABNORMAL HIGH (ref 26.0–34.0)
MCHC: 31.2 g/dL (ref 30.0–36.0)
MCHC: 30 g/dL (ref 30.0–36.0)
MCV: 120.5 fL — ABNORMAL HIGH (ref 80.0–100.0)
MCV: 124.8 fL — ABNORMAL HIGH (ref 80.0–100.0)
Platelets: 84 10*3/uL — ABNORMAL LOW (ref 150–400)
Platelets: 99 10*3/uL — ABNORMAL LOW (ref 150–400)
RBC: 4.04 MIL/uL — ABNORMAL LOW (ref 4.22–5.81)
RBC: 3.95 MIL/uL — ABNORMAL LOW (ref 4.22–5.81)
RDW: 15.4 % (ref 11.5–15.5)
RDW: 15.4 % (ref 11.5–15.5)
WBC: 7 10*3/uL (ref 4.0–10.5)
WBC: 6.1 10*3/uL (ref 4.0–10.5)
nRBC: 0.4 % — ABNORMAL HIGH (ref 0.0–0.2)
nRBC: 0.5 % — ABNORMAL HIGH (ref 0.0–0.2)

## 2020-11-09 LAB — URINALYSIS, ROUTINE W REFLEX MICROSCOPIC
Glucose, UA: 100 mg/dL — AB
Ketones, ur: NEGATIVE mg/dL
Leukocytes,Ua: NEGATIVE
Nitrite: POSITIVE — AB
Protein, ur: 100 mg/dL — AB
Specific Gravity, Urine: >1.03 — ABNORMAL HIGH (ref 1.005–1.030)
pH: 6.5 (ref 5.0–8.0)

## 2020-11-09 LAB — POCT I-STAT EG7
Acid-Base Excess: 8 mmol/L — ABNORMAL HIGH (ref 0.0–2.0)
Bicarbonate: 29.1 mmol/L — ABNORMAL HIGH (ref 20.0–28.0)
Calcium, Ion: 0.55 mmol/L — CL (ref 1.15–1.40)
HCT: 54 % — ABNORMAL HIGH (ref 39.0–52.0)
Hemoglobin: 18.4 g/dL — ABNORMAL HIGH (ref 13.0–17.0)
O2 Saturation: 100 %
Potassium: >8.5 mmol/L (ref 3.5–5.1)
Sodium: 117 mmol/L — CL (ref 135–145)
TCO2: 30 mmol/L (ref 22–32)
pCO2, Ven: 31.3 mmHg — ABNORMAL LOW (ref 44.0–60.0)
pH, Ven: 7.576 — ABNORMAL HIGH (ref 7.250–7.430)
pO2, Ven: 205 mmHg — ABNORMAL HIGH (ref 32.0–45.0)

## 2020-11-09 LAB — BASIC METABOLIC PANEL
Anion gap: 15 (ref 5–15)
BUN: 21 mg/dL — ABNORMAL HIGH (ref 6–20)
CO2: 29 mmol/L (ref 22–32)
Calcium: 7.4 mg/dL — ABNORMAL LOW (ref 8.9–10.3)
Chloride: 96 mmol/L — ABNORMAL LOW (ref 98–111)
Creatinine, Ser: 4.84 mg/dL — ABNORMAL HIGH (ref 0.61–1.24)
GFR, Estimated: 15 mL/min — ABNORMAL LOW (ref >60)
Glucose, Bld: 86 mg/dL (ref 70–99)
Potassium: 4.9 mmol/L (ref 3.5–5.1)
Sodium: 140 mmol/L (ref 135–145)

## 2020-11-09 LAB — COMPREHENSIVE METABOLIC PANEL
ALT: 92 U/L — ABNORMAL HIGH (ref 0–44)
AST: 182 U/L — ABNORMAL HIGH (ref 15–41)
Albumin: 2.5 g/dL — ABNORMAL LOW (ref 3.5–5.0)
Alkaline Phosphatase: 138 U/L — ABNORMAL HIGH (ref 38–126)
Anion gap: 12 (ref 5–15)
BUN: 23 mg/dL — ABNORMAL HIGH (ref 6–20)
CO2: 31 mmol/L (ref 22–32)
Calcium: 7.5 mg/dL — ABNORMAL LOW (ref 8.9–10.3)
Chloride: 98 mmol/L (ref 98–111)
Creatinine, Ser: 5.85 mg/dL — ABNORMAL HIGH (ref 0.61–1.24)
GFR, Estimated: 12 mL/min — ABNORMAL LOW (ref >60)
Glucose, Bld: 88 mg/dL (ref 70–99)
Potassium: 3.8 mmol/L (ref 3.5–5.1)
Sodium: 141 mmol/L (ref 135–145)
Total Bilirubin: 12.3 mg/dL — ABNORMAL HIGH (ref 0.3–1.2)
Total Protein: 5.1 g/dL — ABNORMAL LOW (ref 6.5–8.1)

## 2020-11-09 LAB — LACTIC ACID, PLASMA
Lactic Acid, Venous: 2.1 mmol/L (ref 0.5–1.9)
Lactic Acid, Venous: 3.8 mmol/L (ref 0.5–1.9)

## 2020-11-09 LAB — CK TOTAL AND CKMB (NOT AT ARMC)
CK, MB: 8.2 ng/mL — ABNORMAL HIGH (ref 0.5–5.0)
Relative Index: 2.5 (ref 0.0–2.5)
Total CK: 322 U/L (ref 49–397)

## 2020-11-09 LAB — PHOSPHORUS: Phosphorus: 2.5 mg/dL (ref 2.5–4.6)

## 2020-11-09 MED ORDER — CALCIUM CHLORIDE 10 % IV SOLN
1.0000 g | Freq: Once | INTRAVENOUS | Status: AC
  Administered 2020-11-09: 1 g via INTRAVENOUS
  Filled 2020-11-09: qty 10

## 2020-11-09 MED ORDER — ROCURONIUM BROMIDE 10 MG/ML (PF) SYRINGE
PREFILLED_SYRINGE | INTRAVENOUS | Status: AC
  Administered 2020-11-09: 100 mg
  Filled 2020-11-09: qty 10

## 2020-11-09 MED ORDER — ETOMIDATE 2 MG/ML IV SOLN
10.0000 mg | Freq: Once | INTRAVENOUS | Status: AC
  Administered 2020-11-09: 10 mg via INTRAVENOUS

## 2020-11-09 MED ORDER — THIAMINE HCL 100 MG PO TABS: 100.0000 mg | ORAL_TABLET | Freq: Every day | ORAL | Status: DC

## 2020-11-09 MED ORDER — ROCURONIUM BROMIDE 50 MG/5ML IV SOLN
100.0000 mg | Freq: Once | INTRAVENOUS | Status: AC
  Filled 2020-11-09: qty 10

## 2020-11-09 MED ORDER — STERILE WATER FOR INJECTION IJ SOLN
50.0000 ng/kg/min | INTRAVENOUS | Status: DC
  Administered 2020-11-09 – 2020-11-10 (×6): 50 ng/kg/min via RESPIRATORY_TRACT
  Filled 2020-11-09 (×9): qty 5

## 2020-11-09 MED ORDER — SODIUM BICARBONATE 8.4 % IV SOLN
50.0000 meq | Freq: Once | INTRAVENOUS | Status: AC
  Administered 2020-11-09: 50 meq via INTRAVENOUS

## 2020-11-09 MED ORDER — LACTATED RINGERS IV BOLUS: 1000.0000 mL | Freq: Once | INTRAVENOUS | Status: DC

## 2020-11-09 MED ORDER — FENTANYL CITRATE (PF) 100 MCG/2ML IJ SOLN
50.0000 ug | INTRAMUSCULAR | Status: DC | PRN
  Administered 2020-11-09: 200 ug via INTRAVENOUS
  Administered 2020-11-09: 100 ug via INTRAVENOUS
  Administered 2020-11-09: 200 ug via INTRAVENOUS
  Administered 2020-11-09: 100 ug via INTRAVENOUS
  Administered 2020-11-10 (×5): 200 ug via INTRAVENOUS
  Filled 2020-11-09 (×2): qty 4
  Filled 2020-11-09: qty 2
  Filled 2020-11-09 (×5): qty 4
  Filled 2020-11-09: qty 2

## 2020-11-09 MED ORDER — HYDROCORTISONE NA SUCCINATE PF 100 MG IJ SOLR
100.0000 mg | Freq: Three times a day (TID) | INTRAMUSCULAR | Status: DC
  Administered 2020-11-09 – 2020-11-10 (×5): 100 mg via INTRAVENOUS
  Filled 2020-11-09 (×5): qty 2

## 2020-11-09 MED ORDER — POLYETHYLENE GLYCOL 3350 17 G PO PACK
17.0000 g | PACK | Freq: Every day | ORAL | Status: DC
  Administered 2020-11-10: 17 g
  Filled 2020-11-09: qty 1

## 2020-11-09 MED ORDER — SODIUM BICARBONATE 8.4 % IV SOLN
INTRAVENOUS | Status: AC
  Administered 2020-11-09: 50 meq
  Filled 2020-11-09: qty 100

## 2020-11-09 MED ORDER — PIPERACILLIN-TAZOBACTAM 3.375 G IVPB
3.3750 g | Freq: Three times a day (TID) | INTRAVENOUS | Status: DC
  Administered 2020-11-09: 3.375 g via INTRAVENOUS
  Filled 2020-11-09 (×2): qty 50

## 2020-11-09 MED ORDER — SODIUM CHLORIDE 0.9 % IV SOLN: 1.0000 g | Freq: Once | INTRAVENOUS | Status: DC

## 2020-11-09 MED ORDER — VASOPRESSIN 20 UNITS/100 ML INFUSION FOR SHOCK
0.0000 [IU]/min | INTRAVENOUS | Status: DC
  Administered 2020-11-09 – 2020-11-10 (×4): 0.03 [IU]/min via INTRAVENOUS
  Filled 2020-11-09 (×5): qty 100

## 2020-11-09 MED ORDER — EPINEPHRINE 1 MG/10ML IJ SOSY
1.0000 | PREFILLED_SYRINGE | Freq: Once | INTRAMUSCULAR | Status: AC
  Administered 2020-11-09: 1 mg via INTRAVENOUS

## 2020-11-09 MED ORDER — LACTATED RINGERS IV BOLUS
2000.0000 mL | Freq: Once | INTRAVENOUS | Status: AC
  Administered 2020-11-09: 2000 mL via INTRAVENOUS

## 2020-11-09 MED ORDER — DOCUSATE SODIUM 50 MG/5ML PO LIQD: 100.0000 mg | Freq: Two times a day (BID) | ORAL | Status: DC | PRN

## 2020-11-09 MED ORDER — NOREPINEPHRINE 4 MG/250ML-% IV SOLN
0.0000 ug/min | INTRAVENOUS | Status: DC
  Administered 2020-11-09 – 2020-11-10 (×5): 40 ug/min via INTRAVENOUS
  Filled 2020-11-09: qty 250
  Filled 2020-11-09 (×2): qty 500

## 2020-11-09 MED ORDER — SODIUM CHLORIDE 0.9 % IV SOLN
1.0000 g | Freq: Two times a day (BID) | INTRAVENOUS | Status: DC
  Administered 2020-11-09 – 2020-11-10 (×4): 1 g via INTRAVENOUS
  Filled 2020-11-09 (×5): qty 1

## 2020-11-09 MED ORDER — DOCUSATE SODIUM 50 MG/5ML PO LIQD
100.0000 mg | Freq: Two times a day (BID) | ORAL | Status: DC
  Administered 2020-11-10 (×2): 100 mg
  Filled 2020-11-09 (×2): qty 10

## 2020-11-09 MED ORDER — GUAIFENESIN-DM 100-10 MG/5ML PO SYRP
10.0000 mL | ORAL_SOLUTION | ORAL | Status: DC | PRN
  Filled 2020-11-09: qty 10

## 2020-11-09 MED ORDER — PIPERACILLIN-TAZOBACTAM 3.375 G IVPB
3.3750 g | Freq: Three times a day (TID) | INTRAVENOUS | Status: DC
  Filled 2020-11-09: qty 50

## 2020-11-09 MED ORDER — ETOMIDATE 2 MG/ML IV SOLN
INTRAVENOUS | Status: AC
  Administered 2020-11-09: 20 mg
  Filled 2020-11-09: qty 20

## 2020-11-09 MED ORDER — SODIUM CHLORIDE 0.9 % IV SOLN
1.2500 ng/kg/min | INTRAVENOUS | Status: DC
  Administered 2020-11-09: 5 ng/kg/min via INTRAVENOUS
  Administered 2020-11-10 (×3): 40 ng/kg/min via INTRAVENOUS
  Filled 2020-11-09 (×6): qty 1

## 2020-11-09 MED ORDER — ADULT MULTIVITAMIN LIQUID CH
15.0000 mL | Freq: Every day | ORAL | Status: DC
  Administered 2020-11-10: 15 mL
  Filled 2020-11-09 (×2): qty 15

## 2020-11-09 MED ORDER — THIAMINE HCL 100 MG/ML IJ SOLN
100.0000 mg | Freq: Every day | INTRAMUSCULAR | Status: DC
  Administered 2020-11-10: 100 mg via INTRAVENOUS
  Filled 2020-11-09: qty 2

## 2020-11-09 MED ORDER — POLYETHYLENE GLYCOL 3350 17 G PO PACK: 17.0000 g | PACK | Freq: Every day | ORAL | Status: DC | PRN

## 2020-11-09 NOTE — Progress Notes (Signed)
Attempted left radial arterial line and unable to obtain line.  Pt stuck by 2 RTs unsuccessfully.  Dr Merrily Pew notified.  Sterile technique used.

## 2020-11-09 NOTE — Progress Notes (Signed)
Advanced ET tube to 2cm from 23 to 25 cm per Dr Merrily Pew.  Tolerated well.

## 2020-11-09 NOTE — Progress Notes (Signed)
After art line was placed, ABGs were done, showing 7.11/PCO2 106, PO2 58 and O2 sats 75%.  Vent settings were adjusted.  Increased respiratory rate from 20-34, cannot increase tidal volume due to high peak and plateau pressures. To improve hypoxemia PEEP was increased to 18, patient is a started on INO, it will be switched to Flolan shortly UA showed positive nitrite, urine culture was sent, started on IV meropenem to cover for multidrug-resistant organisms. Patient is currently on Levophed for BP and vasopressin, despite that his blood pressure dropped, he was started on angiotensin II with improvement in blood pressure.  Patient's family was updated over the phone   Additional critical care time: 32 minutes  Performed by: Cheri Fowler   Critical care time was exclusive of separately billable procedures and treating other patients.   Critical care was necessary to treat or prevent imminent or life-threatening deterioration.   Critical care was time spent personally by me on the following activities: development of treatment plan with patient and/or surrogate as well as nursing, discussions with consultants, evaluation of patient's response to treatment, examination of patient, obtaining history from patient or surrogate, ordering and performing treatments and interventions, ordering and review of laboratory studies, ordering and review of radiographic studies, pulse oximetry and re-evaluation of patient's condition.   Cheri Fowler MD [facility] See Amion for pager If no response to pager, please call [phone] until 7pm After 7pm, Please call E-link [phone]

## 2020-11-09 NOTE — Procedures (Signed)
Intubation Procedure Note  KELII CHITTUM  [id number]  [birth year]  Date:11/09/20  Time:11:33 AM   Provider Performing:Jamee Pacholski    Procedure: Intubation (31500)  Indication(s) Respiratory Failure  Consent Risks of the procedure as well as the alternatives and risks of each were explained to the patient and/or caregiver.  Consent for the procedure was obtained and is signed in the bedside chart   Anesthesia Etomidate and Rocuronium   Time Out Verified patient identification, verified procedure, site/side was marked, verified correct patient position, special equipment/implants available, medications/allergies/relevant history reviewed, required imaging and test results available.   Sterile Technique Usual hand hygeine, masks, and gloves were used   Procedure Description Patient positioned in bed supine.  Sedation given as noted above.  Patient was intubated with endotracheal tube using DL.  View was Grade 1 full glottis .  Number of attempts was 1.  Colorimetric CO2 detector was consistent with tracheal placement.   Complications/Tolerance None; patient tolerated the procedure well. Chest X-ray is ordered to verify placement.   EBL Minimal   Specimen(s) None

## 2020-11-09 NOTE — Progress Notes (Addendum)
eLink Physician-Brief Progress Note Patient Name: Jesus Fuller DOB: 1976/02/09 MRN: [id number]   Date of Service  11/09/2020  HPI/Events of Note  Patient with terminal cardio-pulmonary failure , with intractable hypoxemia despite Flolan, patient's mother made him DNR earlier today, but did request continuation of all current Rx.  eICU Interventions  Will continue Flolan through the night and defer further discussions with patient's family to the daytime PCCM attending physician.        Migdalia Dk 11/09/2020, 9:31 PM

## 2020-11-09 NOTE — Progress Notes (Addendum)
NAME:  Jesus Fuller, MRN:  [id number], DOB:  1976-05-06, LOS: 3 ADMISSION DATE:  10/19/2020, CONSULTATION DATE:  10/24/2020 REFERRING MD:  Dr. Effie Shy, CHIEF COMPLAINT:  Hypotension with syncope    History of Present Illness:  Jesus Fuller is  45 y.o. with a PMH significant for NICM with bilateral CHF, tobacco abuse, prior DVT previously on Apixaban,, HTN, ETOH abuse, and asthma who presented to the ED with complaints of persistent N/V with syncope, and later developed SOB. Per chart review patient has suffers from chronic cough and related syncope. No evidence of cardiac pauses or arrhythmias on event monitor.   On arrival vitals significant for hypotension with b/p 87/47 all other VS WNL. 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 PCCM will admit for further workup.   Pertinent  Medical History  NICM with bilateral CHF Tobacco abuse Prior DVT previously on Apixaban HTN ETOH abuse Asthma   Significant Hospital Events: Including procedures, antibiotic start and stop dates in addition to other pertinent events   . Admitted 4/22  . 4/23 progressive agitation, CIWA scores elevated, getting ativan, precedex ordered . 4/25 ETT and left IJ TLC   Interim History / Subjective:  Patient remains unresponsive despite being off sedation, unable to protect his airway with thick secretions.  Decision was made to intubate, during intubation he was noted to have copious amount of vomitus and airway, post intubation he remained hypoxic into the 80s despite being on 100% FiO2 and PEEP of 16.  Objective   Blood pressure (!) 89/61, pulse 79, temperature 99.1 F (37.3 C), temperature source Axillary, resp. rate 15, height 5\' 10"  (1.778 m), weight 126.8 kg, SpO2 (!) 81 %.    Vent Mode: PRVC FiO2 (%):  [100 %] 100 % Set Rate:  [20 bmp] 20 bmp Vt Set:  [440 mL] 440 mL PEEP:  [14 cmH20] 14 cmH20   Intake/Output Summary (Last 24 hours) at 11/09/2020  1136 Last data filed at 11/09/2020 0600 Gross per 24 hour  Intake 2504.47 ml  Output 75 ml  Net 2429.47 ml   Filed Weights   11/07/20 0500 11/08/20 0432 11/09/20 0500  Weight: 124.7 kg 126.7 kg 126.8 kg    Physical Examination: General: Acutely ill-appearing male, orally intubated HEENT: [place]/AT, +icteric sclera, PERRL, mucous membranes pink and slightly dry. Neuro:  Eyes closed, does not open, not following commands, moving all 4 extremities spontaneously PULM:  Bilateral basal crackles, no wheezes or rhonchi GI: Moderately distended, NT Extremities: Bilateral symmetric 1+ LE edema noted. Skin: Warm/dry, diffuse erythema to chest, abdomen, BLE. Mild jaundice.  Labs/imaging that I have  personally reviewed:  UA positive for nitrite Serum creatinine 4.8 Platelet 84  Resolved Hospital Problem List:    Assessment & Plan:  Acute hypoxic respiratory failure due to aspiration pneumonia Septic shock due to urinary tract infection and aspiration pneumonia Acute metabolic encephalopathy Patient was found unresponsive this morning with thick secretions and upper airway, extensively suction he has very weak cough He started desaturating, decision was made to intubate, remain hypotensive on vasopressors Post intubation patient remained hypoxic into the 80s despite being 100% FiO2 and PEEP was increased to 16 Continue pulmonary toilet Continue low tidal volume ventilation Awaiting x-ray chest Started on IV Zosyn to cover for UTI and aspiration pneumonia Follow-up respiratory and blood cultures Minimize sedation with Precedex, RASS goal 0/-1 Patient is not a candidate for ECMO due to multiorgan failure Continue vasopressors with map  goal 65 Started on stress dose steroid  Alcohol induced cardiomyopathy History of CHF diagnosed 2020 with initial EF 35-40%; most recent Echo 07/2020 with EF 55-60%). Hypotensive on presentation to ED to 80s/40s. Event monitor in place. Trop/BNP  unremarkable. Repeat Echo with normal EF Hold home Coreg  Abdominal pain and distention in the setting of liver disease Acute alcoholic hepatitis Bedside ultrasonography showed no ascites RUQ [place] with liver surface irregularity, no acute findings (EtOH, venous congestion) Continue PPI   COPD, without exacerbation Home regimen includes Dulera BID, Albuterol PRN, Flonase.  EtOH abuse now with EtOH withdwrawal Tobacco dependence History of significant EtOH abuse, recently cut back to 2 fifths/week; previously drank a fifth/day. Continue CIWA protocol Continue Precedex Discontinue IV Ativan Continue nicotine prevent nicotine withdrawal  Acute renal failure:  Patient remained oliguric, serum creatinine continue to trend up Renal US showed no acute abnormalities Give 2 L of IV fluid boluses with LR Monitor intake and output, if urine output did not pick up we will call nephrology consult   Best Practice:  Diet: NPO Pain/Anxiety/Delirium protocol (if indicated): Precedex VAP protocol (if indicated): Yes DVT prophylaxis: Subcutaneous Heparin and SCD GI prophylaxis: PPI Glucose control:  SSI No Central venous access: Yes needed Arterial line:  Yes Foley:  N/A Mobility:  bed rest  PT consulted: N/A Last date of multidisciplinary goals of care discussion [4/22] Code Status:  full code Disposition: ICU     Total critical care time: 97 minutes  Performed by: Cheri Fowler   Critical care time was exclusive of separately billable procedures and treating other patients.   Critical care was necessary to treat or prevent imminent or life-threatening deterioration.   Critical care was time spent personally by me on the following activities: development of treatment plan with patient and/or surrogate as well as nursing, discussions with consultants, evaluation of patient's response to treatment, examination of patient, obtaining history from patient or surrogate, ordering and  performing treatments and interventions, ordering and review of laboratory studies, ordering and review of radiographic studies, pulse oximetry and re-evaluation of patient's condition.   Cheri Fowler MD [facility] See Amion for pager If no response to pager, please call [phone] until 7pm After 7pm, Please call E-link [phone]

## 2020-11-09 NOTE — Procedures (Signed)
Cardiopulmonary Resuscitation Note  Jesus Fuller  [id number]  [birth year]  Date:11/09/20  Time:1:24 PM   Provider Performing:Telecia Larocque   Procedure: Cardiopulmonary Resuscitation [phone])  Indication(s) Loss of Pulse  Consent N/A  Anesthesia N/A   Time Out N/A   Sterile Technique Hand hygiene, gloves   Procedure Description Called to patient's room for CODE BLUE. Initial rhythm was PEA/Asystole. Patient received high quality chest compressions for 2 minutes with defibrillation or cardioversion when appropriate. Epinephrine was administered every 3 minutes as directed by time Biomedical engineer. Additional pharmacologic interventions included calcium chloride and sodium bicarbonate. Additional procedural interventions include arterial line.  Return of spontaneous circulation was achieved.  Family called and notified.   Complications/Tolerance N/A   EBL N/A   Specimen(s) N/A  Estimated time to ROSC: 

## 2020-11-09 NOTE — Procedures (Signed)
Central Venous Catheter Insertion Procedure Note  Jesus Fuller  [id number]  1976/06/05  Date:11/09/20  Time:11:35 AM   Provider Performing:Karyn Brull   Procedure: Insertion of Non-tunneled Central Venous Catheter(36556) with US guidance (23557)   Indication(s) Medication administration  Consent Unable to obtain consent due to emergent nature of procedure.  Anesthesia Topical only with 1% lidocaine   Timeout Verified patient identification, verified procedure, site/side was marked, verified correct patient position, special equipment/implants available, medications/allergies/relevant history reviewed, required imaging and test results available.  Sterile Technique Maximal sterile technique including full sterile barrier drape, hand hygiene, sterile gown, sterile gloves, mask, hair covering, sterile ultrasound probe cover (if used).  Procedure Description Area of catheter insertion was cleaned with chlorhexidine and draped in sterile fashion.  With real-time ultrasound guidance a central venous catheter was placed into the left internal jugular vein. Nonpulsatile blood flow and easy flushing noted in all ports.  The catheter was sutured in place and sterile dressing applied.  Complications/Tolerance None; patient tolerated the procedure well. Chest X-ray is ordered to verify placement for internal jugular or subclavian cannulation.   Chest x-ray is not ordered for femoral cannulation.  EBL Minimal  Specimen(s) None

## 2020-11-09 NOTE — Progress Notes (Addendum)
Pharmacy Antibiotic Note  PREM COYKENDALL is a 45 y.o. male admitted on 10/18/2020 , now with concern for aspiration pneumonia. Pharmacy has been consulted for Zosyn dosing.  Febrile overnight (Tmax 101). WBC wnl. Scr up (4.84), with minimal urine output.   Plan: Zosyn 3.375g IV q8h (4 hour infusion).  Monitor renal function, cultures, LOT  Height: 5\' 10"  (177.8 cm) Weight: 126.8 kg (279 lb 8.7 oz) IBW/kg (Calculated) : 73  Temp (24hrs), Avg:99.9 F (37.7 C), Min:99.1 F (37.3 C), Max:101 F (38.3 C)  Recent Labs  Lab 11/07/2020 1432 10/17/2020 1447 10/18/2020 1700 11/07/20 0116 11/07/20 1210 11/08/20 0104 11/09/20 0120  WBC 8.1  --  6.3 8.0  --   --  7.0  CREATININE  --   --  2.72* 3.15* 3.26* 3.39* 4.84*  LATICACIDVEN  --  9.4* 7.2*  --   --   --   --     Estimated Creatinine Clearance: 26.6 mL/min (A) (by C-G formula based on SCr of 4.84 mg/dL (H)).    No Known Allergies  Antimicrobials this admission: Zosyn 4/25 >>  Dose adjustments this admission:   Microbiology results: 4/25 TA:  4/22 Bcx: NGTD 4/22 MRSA PCR neg  Thank you for allowing pharmacy to be a part of this patient's care.  5/22, PharmD PGY1 Pharmacy Resident 11/09/2020 9:09 AM  Please check [email or web address] for unit-specific pharmacy phone numbers.   Addendum: Patient clinical status worsening on 3 pressors - UA positive, sending for culture. Broadening to [facility] for now.   Plan:  Merrem 1g IV every 12 hours.  Monitor renal function to adjust further.  Follow-up culture results and ability to narrow.   [facility], PharmD, BCPS, BCCCP Clinical Pharmacist Please refer to [facility] for [facility] numbers 11/09/2020, 2:19 PM

## 2020-11-09 NOTE — Progress Notes (Incomplete)
Pt has not voided all night. Attempted bladder scan showed 0 mL, however, pt using accessory muscles to breathe. Difficult to get an accurate reading.

## 2020-11-09 NOTE — Treatment Plan (Signed)
Patient's oxygen saturation dropped to 70s despite being on Flolan and 100% FiO2 with 18 of PEEP on ventilator.  Unable to prone because he is on 3 vasopressors Spoke with her patient's mother, had detailed conversation about his prognosis that his heart might be stopped due to low oxygenation. She decided to keep him DNR and continue current management.  DNR orders were placed    Cheri Fowler MD [place] See Amion for pager If no response to pager, please call [phone] until 7pm After 7pm, Please call E-link [phone]

## 2020-11-10 ENCOUNTER — Inpatient Hospital Stay (HOSPITAL_COMMUNITY): Payer: Self-pay

## 2020-11-10 DIAGNOSIS — N170 Acute kidney failure with tubular necrosis: Secondary | ICD-10-CM

## 2020-11-10 DIAGNOSIS — I959 Hypotension, unspecified: Secondary | ICD-10-CM

## 2020-11-10 DIAGNOSIS — J441 Chronic obstructive pulmonary disease with (acute) exacerbation: Secondary | ICD-10-CM

## 2020-11-10 LAB — POCT I-STAT 7, (LYTES, BLD GAS, ICA,H+H)
Acid-base deficit: 4 mmol/L — ABNORMAL HIGH (ref 0.0–2.0)
Acid-base deficit: 10 mmol/L — ABNORMAL HIGH (ref 0.0–2.0)
Bicarbonate: 22.9 mmol/L (ref 20.0–28.0)
Bicarbonate: 20.5 mmol/L (ref 20.0–28.0)
Calcium, Ion: 0.89 mmol/L — CL (ref 1.15–1.40)
Calcium, Ion: 0.93 mmol/L — ABNORMAL LOW (ref 1.15–1.40)
HCT: 52 % (ref 39.0–52.0)
HCT: 49 % (ref 39.0–52.0)
Hemoglobin: 17.7 g/dL — ABNORMAL HIGH (ref 13.0–17.0)
Hemoglobin: 16.7 g/dL (ref 13.0–17.0)
O2 Saturation: 96 %
O2 Saturation: 84 %
Patient temperature: 102
Patient temperature: 104.2
Potassium: 4.5 mmol/L (ref 3.5–5.1)
Potassium: 4.7 mmol/L (ref 3.5–5.1)
Sodium: 136 mmol/L (ref 135–145)
Sodium: 138 mmol/L (ref 135–145)
TCO2: 24 mmol/L (ref 22–32)
TCO2: 22 mmol/L (ref 22–32)
pCO2 arterial: 50.9 mmHg — ABNORMAL HIGH (ref 32.0–48.0)
pCO2 arterial: 73.1 mmHg (ref 32.0–48.0)
pH, Arterial: 7.271 — ABNORMAL LOW (ref 7.350–7.450)
pH, Arterial: 7.075 — CL (ref 7.350–7.450)
pO2, Arterial: 104 mmHg (ref 83.0–108.0)
pO2, Arterial: 82 mmHg — ABNORMAL LOW (ref 83.0–108.0)

## 2020-11-10 LAB — RENAL FUNCTION PANEL
Albumin: 2.4 g/dL — ABNORMAL LOW (ref 3.5–5.0)
Anion gap: 16 — ABNORMAL HIGH (ref 5–15)
BUN: 32 mg/dL — ABNORMAL HIGH (ref 6–20)
CO2: 23 mmol/L (ref 22–32)
Calcium: 8 mg/dL — ABNORMAL LOW (ref 8.9–10.3)
Chloride: 100 mmol/L (ref 98–111)
Creatinine, Ser: 7.77 mg/dL — ABNORMAL HIGH (ref 0.61–1.24)
GFR, Estimated: 8 mL/min — ABNORMAL LOW (ref >60)
Glucose, Bld: 43 mg/dL — CL (ref 70–99)
Phosphorus: 2.5 mg/dL (ref 2.5–4.6)
Potassium: 4.3 mmol/L (ref 3.5–5.1)
Sodium: 139 mmol/L (ref 135–145)

## 2020-11-10 LAB — MRSA PCR SCREENING: MRSA by PCR: NEGATIVE

## 2020-11-10 LAB — COMPREHENSIVE METABOLIC PANEL
ALT: 108 U/L — ABNORMAL HIGH (ref 0–44)
AST: 281 U/L — ABNORMAL HIGH (ref 15–41)
Albumin: 2.6 g/dL — ABNORMAL LOW (ref 3.5–5.0)
Alkaline Phosphatase: 90 U/L (ref 38–126)
Anion gap: 19 — ABNORMAL HIGH (ref 5–15)
BUN: 28 mg/dL — ABNORMAL HIGH (ref 6–20)
CO2: 21 mmol/L — ABNORMAL LOW (ref 22–32)
Calcium: 7.2 mg/dL — ABNORMAL LOW (ref 8.9–10.3)
Chloride: 96 mmol/L — ABNORMAL LOW (ref 98–111)
Creatinine, Ser: 6.9 mg/dL — ABNORMAL HIGH (ref 0.61–1.24)
GFR, Estimated: 9 mL/min — ABNORMAL LOW (ref >60)
Glucose, Bld: 64 mg/dL — ABNORMAL LOW (ref 70–99)
Potassium: 4.6 mmol/L (ref 3.5–5.1)
Sodium: 136 mmol/L (ref 135–145)
Total Bilirubin: 15.6 mg/dL — ABNORMAL HIGH (ref 0.3–1.2)
Total Protein: 5.4 g/dL — ABNORMAL LOW (ref 6.5–8.1)

## 2020-11-10 LAB — GLUCOSE, CAPILLARY
Glucose-Capillary: 66 mg/dL — ABNORMAL LOW (ref 70–99)
Glucose-Capillary: 70 mg/dL (ref 70–99)
Glucose-Capillary: 44 mg/dL — CL (ref 70–99)
Glucose-Capillary: 75 mg/dL (ref 70–99)
Glucose-Capillary: 33 mg/dL — CL (ref 70–99)
Glucose-Capillary: 92 mg/dL (ref 70–99)
Glucose-Capillary: 38 mg/dL — CL (ref 70–99)
Glucose-Capillary: 98 mg/dL (ref 70–99)
Glucose-Capillary: 59 mg/dL — ABNORMAL LOW (ref 70–99)
Glucose-Capillary: 98 mg/dL (ref 70–99)

## 2020-11-10 LAB — CBC
HCT: 51.4 % (ref 39.0–52.0)
Hemoglobin: 15.8 g/dL (ref 13.0–17.0)
MCH: 37.4 pg — ABNORMAL HIGH (ref 26.0–34.0)
MCHC: 30.7 g/dL (ref 30.0–36.0)
MCV: 121.8 fL — ABNORMAL HIGH (ref 80.0–100.0)
Platelets: 105 10*3/uL — ABNORMAL LOW (ref 150–400)
RBC: 4.22 MIL/uL (ref 4.22–5.81)
RDW: 15.7 % — ABNORMAL HIGH (ref 11.5–15.5)
WBC: 6.2 10*3/uL (ref 4.0–10.5)
nRBC: 1.3 % — ABNORMAL HIGH (ref 0.0–0.2)

## 2020-11-10 LAB — PHOSPHORUS: Phosphorus: 1.4 mg/dL — ABNORMAL LOW (ref 2.5–4.6)

## 2020-11-10 LAB — MAGNESIUM: Magnesium: 1.6 mg/dL — ABNORMAL LOW (ref 1.7–2.4)

## 2020-11-10 LAB — SALICYLATE LEVEL: Salicylate Lvl: 11.3 mg/dL (ref 7.0–30.0)

## 2020-11-10 LAB — ACETAMINOPHEN LEVEL: Acetaminophen (Tylenol), Serum: <10 ug/mL — ABNORMAL LOW (ref 10–30)

## 2020-11-10 MED ORDER — HEPARIN SODIUM (PORCINE) 1000 UNIT/ML DIALYSIS
1000.0000 [IU] | INTRAMUSCULAR | Status: DC | PRN
  Administered 2020-11-10: 2400 [IU] via INTRAVENOUS_CENTRAL
  Filled 2020-11-10: qty 5
  Filled 2020-11-10 (×2): qty 6

## 2020-11-10 MED ORDER — PRISMASOL BGK 4/2.5 32-4-2.5 MEQ/L REPLACEMENT SOLN
Status: DC
  Filled 2020-11-10 (×3): qty 5000

## 2020-11-10 MED ORDER — DEXTROSE 20 % IV SOLN
INTRAVENOUS | Status: DC
  Filled 2020-11-10 (×2): qty 500

## 2020-11-10 MED ORDER — POTASSIUM & SODIUM PHOSPHATES 280-160-250 MG PO PACK
1.0000 | PACK | Freq: Three times a day (TID) | ORAL | Status: AC
  Administered 2020-11-10 (×3): 1
  Filled 2020-11-10 (×3): qty 1

## 2020-11-10 MED ORDER — DEXTROSE 50 % IV SOLN
INTRAVENOUS | Status: AC
  Administered 2020-11-10: 25 g via INTRAVENOUS
  Filled 2020-11-10: qty 50

## 2020-11-10 MED ORDER — SODIUM BICARBONATE 8.4 % IV SOLN
INTRAVENOUS | Status: AC
  Administered 2020-11-10: 100 meq via INTRAVENOUS
  Filled 2020-11-10: qty 100

## 2020-11-10 MED ORDER — NOREPINEPHRINE 16 MG/250ML-% IV SOLN
0.0000 ug/min | INTRAVENOUS | Status: DC
  Administered 2020-11-10: 76 ug/min via INTRAVENOUS
  Filled 2020-11-10: qty 250

## 2020-11-10 MED ORDER — CALCIUM GLUCONATE-NACL 2-0.675 GM/100ML-% IV SOLN
2.0000 g | Freq: Once | INTRAVENOUS | Status: AC
  Administered 2020-11-10: 2000 mg via INTRAVENOUS
  Filled 2020-11-10: qty 100

## 2020-11-10 MED ORDER — DEXTROSE 50 % IV SOLN
25.0000 g | INTRAVENOUS | Status: AC
  Administered 2020-11-10: 25 g via INTRAVENOUS

## 2020-11-10 MED ORDER — MAGNESIUM SULFATE 2 GM/50ML IV SOLN
2.0000 g | Freq: Once | INTRAVENOUS | Status: AC
  Administered 2020-11-10: 2 g via INTRAVENOUS
  Filled 2020-11-10: qty 50

## 2020-11-10 MED ORDER — SODIUM CHLORIDE 0.9% FLUSH: 10.0000 mL | INTRAVENOUS | Status: DC | PRN

## 2020-11-10 MED ORDER — SODIUM CHLORIDE 0.9 % FOR CRRT: INTRAVENOUS_CENTRAL | Status: DC | PRN

## 2020-11-10 MED ORDER — PRISMASOL BGK 4/2.5 32-4-2.5 MEQ/L REPLACEMENT SOLN
Status: DC
  Filled 2020-11-10 (×2): qty 5000

## 2020-11-10 MED ORDER — CHLORHEXIDINE GLUCONATE 0.12% ORAL RINSE (MEDLINE KIT)
15.0000 mL | Freq: Two times a day (BID) | OROMUCOSAL | Status: DC
  Administered 2020-11-10 (×2): 15 mL via OROMUCOSAL

## 2020-11-10 MED ORDER — DEXTROSE 50 % IV SOLN
INTRAVENOUS | Status: AC
  Administered 2020-11-10: 50 mL
  Filled 2020-11-10: qty 50

## 2020-11-10 MED ORDER — DEXTROSE 50 % IV SOLN
INTRAVENOUS | Status: AC
  Filled 2020-11-10: qty 50

## 2020-11-10 MED ORDER — PRISMASOL BGK 4/2.5 32-4-2.5 MEQ/L EC SOLN
Status: DC
  Filled 2020-11-10 (×9): qty 5000

## 2020-11-10 MED ORDER — NOREPINEPHRINE 16 MG/250ML-% IV SOLN
0.0000 ug/min | INTRAVENOUS | Status: DC
  Administered 2020-11-10: 38 ug/min via INTRAVENOUS
  Administered 2020-11-10: 70 ug/min via INTRAVENOUS
  Administered 2020-11-10: 40 ug/min via INTRAVENOUS
  Filled 2020-11-10 (×4): qty 250

## 2020-11-10 MED ORDER — DEXTROSE 50 % IV SOLN
25.0000 g | INTRAVENOUS | Status: AC
  Administered 2020-11-10: 25 g via INTRAVENOUS
  Filled 2020-11-10: qty 100

## 2020-11-10 MED ORDER — DEXTROSE 10 % IV SOLN: INTRAVENOUS | Status: DC

## 2020-11-10 MED ORDER — SODIUM CHLORIDE 0.9% FLUSH
10.0000 mL | Freq: Two times a day (BID) | INTRAVENOUS | Status: DC
Start: 2020-11-10 — End: 2020-11-11
  Administered 2020-11-10 (×2): 10 mL
  Administered 2020-11-10: 40 mL

## 2020-11-10 MED ORDER — DEXTROSE 50 % IV SOLN
25.0000 g | INTRAVENOUS | Status: AC
Start: 2020-11-10 — End: 2020-11-10

## 2020-11-10 MED ORDER — LACTULOSE 10 GM/15ML PO SOLN
20.0000 g | ORAL | Status: DC
  Administered 2020-11-10 (×4): 20 g
  Filled 2020-11-10 (×4): qty 30

## 2020-11-10 MED ORDER — SODIUM BICARBONATE 8.4 % IV SOLN: 100.0000 meq | Freq: Once | INTRAVENOUS | Status: AC

## 2020-11-10 MED ORDER — CALCIUM CHLORIDE 10 % IV SOLN
2.0000 g | Freq: Once | INTRAVENOUS | Status: AC
  Administered 2020-11-10: 2 g via INTRAVENOUS
  Filled 2020-11-10: qty 20

## 2020-11-10 MED ORDER — ORAL CARE MOUTH RINSE
15.0000 mL | OROMUCOSAL | Status: DC
  Administered 2020-11-10 (×6): 15 mL via OROMUCOSAL

## 2020-11-11 LAB — CULTURE, BLOOD (ROUTINE X 2)
Culture: NO GROWTH
Special Requests: ADEQUATE

## 2020-11-11 LAB — CULTURE, RESPIRATORY W GRAM STAIN

## 2020-11-11 LAB — GLUCOSE, CAPILLARY: Glucose-Capillary: 30 mg/dL — CL (ref 70–99)

## 2020-11-12 LAB — CULTURE, BLOOD (ROUTINE X 2)
Culture: NO GROWTH
Special Requests: ADEQUATE

## 2020-11-13 ENCOUNTER — Other Ambulatory Visit: Payer: Self-pay

## 2020-11-15 NOTE — Death Summary Note (Signed)
DEATH SUMMARY   Patient Details  Name: Jesus Fuller MRN: [id number] DOB: [birth year]  Admission/Discharge Information   Admit Date:  12-03-2020  Date of Death: Date of Death: 07-Dec-2020  Time of Death: Time of Death: 2254-11-28  Length of Stay: 5  Referring Physician: Arvilla Market, DO   Reason(s) for Hospitalization  Acute hypoxic/hypercapnic respiratory failure due to aspiration pneumonia Septic shock due to urinary tract infection and aspiration pneumonia with Pseudomonas Acute metabolic encephalopathy Alcohol induced cardiomyopathy Acute alcoholic hepatitis EtOH abuse now with EtOH withdwrawal Tobacco dependence Acute renal failure: . High anion gap metabolic acidosis  Diagnoses  Preliminary cause of death: Septic shock due to Pseudomonas pneumonia Secondary Diagnoses (including complications and co-morbidities):  Active Problems:   Heart failure [facility])   Brief Hospital Course (including significant findings, care, treatment, and services provided and events leading to death)  Jesus Fuller is a 45 y.o. year old male with a PMH significant for NICM with bilateral CHF, tobacco abuse, prior DVT previously on Apixaban,, HTN, ETOH abuse, and asthma who presented to the ED with complaints of persistent N/V with syncope, and later developed SOB. Per chart review patient has suffers from chronic cough and related syncope. No evidence of cardiac pauses or arrhythmias on event monitor.   Patient was admitted to ICU, he remained unresponsive, noted to have thick respiratory secretions, due to unresponsiveness decision was made to intubate the patient due to acute hypoxic and hypercapnic respiratory failure caused by aspiration pneumonia during intubation patient was noted to have copious amount of foul-smelling vomiting and it was noted that patient aspirated during that time.  Respiratory culture grew Pseudomonas he remain septic, his blood pressure remained low initially he was  started on Levophed and then vasopressin and angiotensin II was added to maintain MAP 65, pressor requirement went to maximal support, patient is started desaturating despite being on maximum ventilatory support, started on Flolan to improve hypoxemia, he was not prone due to hemodynamic instability. Patient continued to drop his blood pressure and oxygen saturation, patient's family was contacted regarding goals of care discussion, patient's brother and mother who is the healthcare proxy, she came to hospital and we had discussion at bedside, patient was made DNR and continue current medical care but on Dec 07, 2020 patient became hypotensive despite maximum vasopressor support, and hypoxic and his heart was stopped at 2256, he was declared dead.    Pertinent Labs and Studies  Significant Diagnostic Studies DG Abd 1 View  Result Date: 11/09/2020 CLINICAL DATA:  Status post NG tube placement. EXAM: ABDOMEN - 1 VIEW COMPARISON:  None. FINDINGS: NG tube is in place with the side port in the distal esophagus. Recommend advancement of approximately 11 cm. IMPRESSION: As above. Electronically Signed   By: Drusilla Kanner M.D.   On: 11/09/2020 12:05   US RENAL  Result Date: 11/07/2020 CLINICAL DATA:  Acute renal failure EXAM: RENAL / URINARY TRACT ULTRASOUND COMPLETE COMPARISON:  None. FINDINGS: Right Kidney: Renal measurements: 12.3 x 4.6 x 5.0 cm = volume: 148.4 mL. Echogenicity within normal limits. No mass or hydronephrosis visualized. Left Kidney: Renal measurements: 11.5 x 6.4 x 6.2 cm = volume: 239.3 mL. Echogenicity within normal limits. No mass or hydronephrosis visualized. Bladder: The bladder is poorly distended but grossly unremarkable. Other: None. IMPRESSION: 1. The kidneys are normal in appearance. 2. The bladder is poorly distended limiting evaluation. However, no abnormalities are otherwise seen. Electronically Signed   By: Gerome Sam III M.D   On:  11/07/2020 15:35   DG CHEST PORT 1  VIEW  Result Date: 11/09/2020 CLINICAL DATA:  Respiratory failure/hypoxia EXAM: PORTABLE CHEST 1 VIEW COMPARISON:  November 09, 2020 FINDINGS: Endotracheal tube tip is 4.9 cm above the carina. Nasogastric tube tip and side port are below the diaphragm. Left jugular catheter tip is in the superior vena cava. Right jugular catheter tip is also in the superior vena cava. There is a loop recorder on the left. No pneumothorax. There is a small right pleural effusion with ill-defined airspace opacity in the right base. Left lung is clear. Heart is upper normal in size with pulmonary vascularity normal. No adenopathy. No bone lesions. IMPRESSION: Tube and catheter positions as described without pneumothorax. Small right pleural effusion with ill-defined airspace opacity suspicious for a degree of pneumonia right base. Left lung clear. Heart upper normal in size. Electronically Signed   By: Bretta Bang III M.D.   On: 10/24/2020 15:20   DG CHEST PORT 1 VIEW  Result Date: 11/09/2020 CLINICAL DATA:  STATUS POST INTUBATION. EXAM: PORTABLE CHEST 1 VIEW COMPARISON:  10/27/2020 FINDINGS: Endotracheal tube tip is above the carina. NG tube tip is below the expected location of the GE junction. Left IJ catheter tip is obscured by overlying cardiac leads loop recorder is noted in the projection of the left midlung. Stable cardiomediastinal contours. There is diffuse retrocardiac opacification of the left lower lobe compatible with atelectasis and or airspace disease. New bilateral pleural effusions. IMPRESSION: 1. Support apparatus positioned as above 2. New retrocardiac opacification of the left lower lobe compatible with airspace consolidation, atelectasis and/or effusion. 3. Suspect bilateral pleural effusions. Electronically Signed   By: Signa Kell M.D.   On: 11/09/2020 12:09   DG Chest Port 1 View  Result Date: 11/08/2020 CLINICAL DATA:  Shortness of breath EXAM: PORTABLE CHEST 1 VIEW COMPARISON:  07/22/2020  FINDINGS: Loop recorder device is present. The heart size and mediastinal contours are within normal limits. No focal airspace consolidation, pleural effusion, or pneumothorax. Chronic posttraumatic deformity of the right clavicle. IMPRESSION: No active disease. Electronically Signed   By: Duanne Guess D.O.   On: 10/18/2020 15:18   ECHOCARDIOGRAM COMPLETE  Result Date: 11/07/2020    ECHOCARDIOGRAM REPORT   Patient Name:   ZEESHAN KORTE Date of Exam: 11/07/2020 Medical Rec #:  [id number]      Height:       70.0 in Accession #:    [id number]     Weight:       274.9 lb Date of Birth:  1976/01/02      BSA:          2.390 m Patient Age:    44 years       BP:           117/77 mmHg Patient Gender: M              HR:           80 bpm. Exam Location:  Inpatient Procedure: 2D Echo Indications:    acute ischemic heart disease  History:        Patient has prior history of Echocardiogram examinations, most                 recent 07/24/2020. COPD, Signs/Symptoms:Syncope; Risk                 Factors:Alcohol abuse, Current Smoker and Hypertension.  Sonographer:    Delcie Roch Referring Phys: [id number] WHITNEY F DAVIS IMPRESSIONS  1. Technically difficult; LV function appears to be preserved; focal wall motion abnormality cannot be excluded.  2. Left ventricular ejection fraction, by estimation, is 55 to 60%. The left ventricle has normal function. The left ventricle has no regional wall motion abnormalities. Left ventricular diastolic parameters were normal.  3. Right ventricular systolic function is normal. The right ventricular size is normal.  4. The mitral valve is normal in structure. No evidence of mitral valve regurgitation. No evidence of mitral stenosis.  5. The aortic valve is tricuspid. Aortic valve regurgitation is not visualized. No aortic stenosis is present. FINDINGS  Left Ventricle: Left ventricular ejection fraction, by estimation, is 55 to 60%. The left ventricle has normal function. The left ventricle has  no regional wall motion abnormalities. The left ventricular internal cavity size was normal in size. There is  no left ventricular hypertrophy. Left ventricular diastolic parameters were normal. Right Ventricle: The right ventricular size is normal. Right ventricular systolic function is normal. Left Atrium: Left atrial size was normal in size. Right Atrium: Right atrial size was normal in size. Pericardium: There is no evidence of pericardial effusion. Mitral Valve: The mitral valve is normal in structure. No evidence of mitral valve regurgitation. No evidence of mitral valve stenosis. Tricuspid Valve: The tricuspid valve is normal in structure. Tricuspid valve regurgitation is trivial. No evidence of tricuspid stenosis. Aortic Valve: The aortic valve is tricuspid. Aortic valve regurgitation is not visualized. No aortic stenosis is present. Pulmonic Valve: The pulmonic valve was not well visualized. Pulmonic valve regurgitation is not visualized. No evidence of pulmonic stenosis. Aorta: The aortic root is normal in size and structure. Venous: The inferior vena cava was not well visualized.  Additional Comments: Technically difficult; LV function appears to be preserved; focal wall motion abnormality cannot be excluded.  LEFT VENTRICLE PLAX 2D LVIDd:         5.00 cm  Diastology LVIDs:         3.40 cm  LV e' medial:    8.38 cm/s LV PW:         1.00 cm  LV E/e' medial:  8.5 LV IVS:        1.00 cm  LV e' lateral:   12.20 cm/s LVOT diam:     2.10 cm  LV E/e' lateral: 5.9 LV SV:         68 LV SV Index:   29 LVOT Area:     3.46 cm  RIGHT VENTRICLE RV S prime:     12.20 cm/s TAPSE (M-mode): 2.5 cm LEFT ATRIUM             Index       RIGHT ATRIUM           Index LA diam:        3.30 cm 1.38 cm/m  RA Area:     14.50 cm LA Vol (A2C):   36.1 ml 15.11 ml/m RA Volume:   36.40 ml  15.23 ml/m LA Vol (A4C):   36.7 ml 15.36 ml/m LA Biplane Vol: 40.0 ml 16.74 ml/m  AORTIC VALVE LVOT Vmax:   107.00 cm/s LVOT Vmean:  70.300 cm/s  LVOT VTI:    0.197 m  AORTA Ao Root diam: 3.20 cm Ao Asc diam:  2.90 cm MITRAL VALVE               TRICUSPID VALVE MV Area (PHT): 3.48 cm    TR Peak grad:   33.6 mmHg MV Decel Time: 218  msec    TR Vmax:        290.00 cm/s MV E velocity: 71.60 cm/s MV A velocity: 77.60 cm/s  SHUNTS MV E/A ratio:  0.92        Systemic VTI:  0.20 m                            Systemic Diam: 2.10 cm Olga MillersBrian Crenshaw MD Electronically signed by Olga MillersBrian Crenshaw MD Signature Date/Time: 11/07/2020/2:58:40 PM    Final    US Abdomen Limited RUQ (LIVER/GB)  Result Date: 10/22/2020 CLINICAL DATA:  Heart failure EXAM: ULTRASOUND ABDOMEN LIMITED RIGHT UPPER QUADRANT COMPARISON:  None. FINDINGS: Gallbladder: No gallstones or wall thickening visualized. No sonographic Murphy sign noted by sonographer. Common bile duct: Diameter: 10 mm Liver: No focal lesion identified. Hepatomegaly with diffusely decreased parenchymal echogenicity. Portal vein is patent on color Doppler imaging with normal direction of blood flow towards the liver. Other: None. IMPRESSION: 1. Hepatomegaly with diffusely decreased parenchymal echogenicity, which is nonspecific but given patient's history of heart failure likely reflects congestive hepatopathy. 2. Dilated common bile duct measuring 10 mm, recommend correlation with laboratory values including total bilirubin to assess for biliary obstruction. Electronically Signed   By: Maudry MayhewJeffrey  Waltz MD   On: 10/31/2020 17:46    Microbiology Recent Results (from the past 240 hour(s))  Culture, blood (routine x 2)     Status: None   Collection Time: 10/23/2020  2:47 PM   Specimen: BLOOD  Result Value Ref Range Status   Specimen Description BLOOD SITE NOT SPECIFIED  Final   Special Requests   Final    BOTTLES DRAWN AEROBIC AND ANAEROBIC Blood Culture adequate volume   Culture   Final    NO GROWTH 5 DAYS Performed at [facility], [address] [place], [place] [address]    Report Status 02/25/2021 FINAL  Final   Resp Panel by RT-PCR (Flu A&B, Covid) Nasopharyngeal Swab     Status: None   Collection Time: 10/30/2020  2:54 PM   Specimen: Nasopharyngeal Swab; Nasopharyngeal(NP) swabs in vial transport medium  Result Value Ref Range Status   SARS Coronavirus 2 by RT PCR NEGATIVE NEGATIVE Final    Comment: (NOTE) SARS-CoV-2 target nucleic acids are NOT DETECTED.  The SARS-CoV-2 RNA is generally detectable in upper respiratory specimens during the acute phase of infection. The lowest concentration of SARS-CoV-2 viral copies this assay can detect is 138 copies/mL. A negative result does not preclude SARS-Cov-2 infection and should not be used as the sole basis for treatment or other patient management decisions. A negative result may occur with  improper specimen collection/handling, submission of specimen other than nasopharyngeal swab, presence of viral mutation(s) within the areas targeted by this assay, and inadequate number of viral copies(<138 copies/mL). A negative result must be combined with clinical observations, patient history, and epidemiological information. The expected result is Negative.  Fact Sheet for Patients:  BloggerCourse.comhttps://www.fda.gov/media/152166/download  Fact Sheet for Healthcare Providers:  SeriousBroker.ithttps://www.fda.gov/media/152162/download  This test is no t yet approved or cleared by the [place] FDA and  has been authorized for detection and/or diagnosis of SARS-CoV-2 by FDA under an Emergency Use Authorization (EUA). This EUA will remain  in effect (meaning this test can be used) for the duration of the COVID-19 declaration under Section 564(b)(1) of the Act, 21 U.S.C.section 360bbb-3(b)(1), unless the authorization is terminated  or revoked sooner.       Influenza  A by PCR NEGATIVE NEGATIVE Final   Influenza B by PCR NEGATIVE NEGATIVE Final    Comment: (NOTE) The Xpert Xpress SARS-CoV-2/FLU/RSV plus assay is intended as an aid in the diagnosis of influenza from  Nasopharyngeal swab specimens and should not be used as a sole basis for treatment. Nasal washings and aspirates are unacceptable for Xpert Xpress SARS-CoV-2/FLU/RSV testing.  Fact Sheet for Patients: BloggerCourse.com  Fact Sheet for Healthcare Providers: SeriousBroker.it  This test is not yet approved or cleared by the [place] FDA and has been authorized for detection and/or diagnosis of SARS-CoV-2 by FDA under an Emergency Use Authorization (EUA). This EUA will remain in effect (meaning this test can be used) for the duration of the COVID-19 declaration under Section 564(b)(1) of the Act, 21 U.S.C. section 360bbb-3(b)(1), unless the authorization is terminated or revoked.  Performed at [facility], [address], [place], [place] [address]   MRSA PCR Screening     Status: None   Collection Time: 10/24/2020  9:44 PM   Specimen: Nasal Mucosa; Nasopharyngeal  Result Value Ref Range Status   MRSA by PCR NEGATIVE NEGATIVE Final    Comment:        The GeneXpert MRSA Assay (FDA approved for NASAL specimens only), is one component of a comprehensive MRSA colonization surveillance program. It is not intended to diagnose MRSA infection nor to guide or monitor treatment for MRSA infections. Performed at [facility], [address] [place], [place] [address]   Culture, blood (routine x 2)     Status: None (Preliminary result)   Collection Time: 10/22/2020 10:07 PM   Specimen: BLOOD RIGHT HAND  Result Value Ref Range Status   Specimen Description BLOOD RIGHT HAND  Final   Special Requests AEROBIC BOTTLE ONLY Blood Culture adequate volume  Final   Culture   Final    NO GROWTH 4 DAYS Performed at [facility], [address], [place], [place] [address]    Report Status PENDING  Incomplete  Culture, Respiratory w Gram Stain     Status: None   Collection Time: 11/09/20 12:33 PM   Specimen: Tracheal  Aspirate; Respiratory  Result Value Ref Range Status   Specimen Description TRACHEAL ASPIRATE  Final   Special Requests NONE  Final   Gram Stain   Final    MODERATE WBC PRESENT, PREDOMINANTLY PMN ABUNDANT GRAM NEGATIVE RODS FEW GRAM POSITIVE COCCI Performed at [facility], [address], [place], [place] [address]    Culture MODERATE PSEUDOMONAS AERUGINOSA  Final   Report Status 10/20/2020 FINAL  Final   Organism ID, Bacteria PSEUDOMONAS AERUGINOSA  Final      Susceptibility   Pseudomonas aeruginosa - MIC*    CEFTAZIDIME 2 SENSITIVE Sensitive     CIPROFLOXACIN <=0.25 SENSITIVE Sensitive     GENTAMICIN <=1 SENSITIVE Sensitive     IMIPENEM 1 SENSITIVE Sensitive     PIP/TAZO <=4 SENSITIVE Sensitive     CEFEPIME 1 SENSITIVE Sensitive     * MODERATE PSEUDOMONAS AERUGINOSA  MRSA PCR Screening     Status: None   Collection Time: 12/01/2020  3:14 PM   Specimen: Nasal Mucosa; Nasopharyngeal  Result Value Ref Range Status   MRSA by PCR NEGATIVE NEGATIVE Final    Comment:        The GeneXpert MRSA Assay (FDA approved for NASAL specimens only), is one component of a comprehensive MRSA colonization surveillance program. It is not intended to diagnose MRSA  infection nor to guide or monitor treatment for MRSA infections. Performed at [facility], [address], [place], [place] [address]     Lab Basic Metabolic Panel: Recent Labs  Lab 11/07/20 0116 11/07/20 1210 11/07/20 1910 11/08/20 0104 11/09/20 0120 11/09/20 1327 11/09/20 1346 11/09/20 1532 11/09/2020 0338 11/13/2020 0734 11-Nov-2020 1523 10/25/2020 2247  NA 137   < >  --  139 140 141   < > 140 136 136 139 138  K 3.5   < >  --  3.3* 4.9 3.8   < > 3.9 4.6 4.5 4.3 4.7  CL 91*   < >  --  95* 96* 98  --   --  96*  --  100  --   CO2 28   < >  --  31 29 31   --   --  21*  --  23  --   GLUCOSE 144*   < >  --  121* 86 88  --   --  64*  --  43*  --   BUN 7   < >  --  12 21* 23*  --   --  28*  --  32*  --   CREATININE  3.15*   < >  --  3.39* 4.84* 5.85*  --   --  6.90*  --  7.77*  --   CALCIUM 8.9   < >  --  8.2* 7.4* 7.5*  --   --  7.2*  --  8.0*  --   MG 1.1*  --   --  2.1 2.1  --   --   --  1.6*  --   --   --   PHOS <1.0*   < > 4.1 3.7 2.5  --   --   --  1.4*  --  2.5  --    < > = values in this interval not displayed.   Liver Function Tests: Recent Labs  Lab 11/05/2020 1700 11/07/20 0116 11/09/20 1327 10/23/2020 0338 10/29/2020 1523  AST 161* 160* 182* 281*  --   ALT 145* 140* 92* 108*  --   ALKPHOS 246* 200* 138* 90  --   BILITOT 9.5* 10.0* 12.3* 15.6*  --   PROT 5.6* 6.0* 5.1* 5.4*  --   ALBUMIN 2.7* 3.2* 2.5* 2.6* 2.4*   Recent Labs  Lab 11/12/2020 1700  LIPASE 26   Recent Labs  Lab 11/14/2020 1700  AMMONIA 52*   CBC: Recent Labs  Lab 10/24/2020 1432 10/30/2020 1452 11/02/2020 1700 11/07/20 0116 11/09/20 0120 11/09/20 1327 11/09/20 1346 11/09/20 1450 11/09/20 1532 Nov 11, 2020 0338 11/02/2020 0734 10/31/2020 2247  WBC 8.1  --  6.3 8.0 7.0 6.1  --   --   --  6.2  --   --   NEUTROABS 5.9  --   --   --   --   --   --   --   --   --   --   --   HGB 16.7   < > 15.9 15.4 15.2 14.8   < > 16.7 17.3* 15.8 17.7* 16.7  HCT 47.2   < > 47.1 45.4 48.7 49.3   < > 49.0 51.0 51.4 52.0 49.0  MCV 108.8*  --  110.6* 110.7* 120.5* 124.8*  --   --   --  121.8*  --   --   PLT PLATELET CLUMPS NOTED ON SMEAR, UNABLE TO ESTIMATE  --  86*  90* 84* 99*  --   --   --  105*  --   --    < > = values in this interval not displayed.   Cardiac Enzymes: Recent Labs  Lab 11/09/20 0914  CKTOTAL 322  CKMB 8.2*   Sepsis Labs: Recent Labs  Lab 11/08/2020 1447 11/08/20 1700 11/07/20 0116 11/09/20 0120 11/09/20 0914 11/09/20 1327 11/09/20 1425 10/30/2020 0338  WBC  --  6.3 8.0 7.0  --  6.1  --  6.2  LATICACIDVEN 9.4* 7.2*  --   --  2.1*  --  3.8*  --     Procedures/Operations     [employer] 10/31/2020, 9:37 AM

## 2020-11-15 NOTE — Procedures (Signed)
Central Venous Catheter Insertion Procedure Note  Jesus Fuller  [id number]  07-19-1975  Date:10/16/2020  Time:12:47 PM   Provider Performing:Alayzha An   Procedure: Insertion of Non-tunneled Central Venous Catheter(36556)with US guidance (51025)    Indication(s) Hemodialysis  Consent Risks of the procedure as well as the alternatives and risks of each were explained to the patient and/or caregiver.  Consent for the procedure was obtained and is signed in the bedside chart  Anesthesia Topical only with 1% lidocaine   Timeout Verified patient identification, verified procedure, site/side was marked, verified correct patient position, special equipment/implants available, medications/allergies/relevant history reviewed, required imaging and test results available.  Sterile Technique Maximal sterile technique including full sterile barrier drape, hand hygiene, sterile gown, sterile gloves, mask, hair covering, sterile ultrasound probe cover (if used).  Procedure Description Area of catheter insertion was cleaned with chlorhexidine and draped in sterile fashion.   With real-time ultrasound guidance a HD catheter was placed into the right internal jugular vein.  Nonpulsatile blood flow and easy flushing noted in all ports.  The catheter was sutured in place and sterile dressing applied.  Complications/Tolerance None; patient tolerated the procedure well. Chest X-ray is ordered to verify placement for internal jugular or subclavian cannulation.  Chest x-ray is not ordered for femoral cannulation.  EBL Minimal  Specimen(s) None

## 2020-11-15 NOTE — Progress Notes (Signed)
NAME:  Jesus Fuller, MRN:  [id number], DOB:  12/29/1975, LOS: 4 ADMISSION DATE:  11/02/2020, CONSULTATION DATE:  10/29/2020 REFERRING MD:  Dr. Effie Shy, CHIEF COMPLAINT:  Hypotension with syncope    History of Present Illness:  Jesus Fuller is  45 y.o. with a PMH significant for NICM with bilateral CHF, tobacco abuse, prior DVT previously on Apixaban,, HTN, ETOH abuse, and asthma who presented to the ED with complaints of persistent N/V with syncope, and later developed SOB. Per chart review patient has suffers from chronic cough and related syncope. No evidence of cardiac pauses or arrhythmias on event monitor.   On arrival vitals significant for hypotension with b/p 87/47 all other VS WNL. 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 PCCM will admit for further workup.   Pertinent  Medical History  NICM with bilateral CHF Tobacco abuse Prior DVT previously on Apixaban HTN ETOH abuse Asthma   Significant Hospital Events: Including procedures, antibiotic start and stop dates in addition to other pertinent events   . Admitted 4/22  . 4/23 progressive agitation, CIWA scores elevated, getting ativan, precedex ordered . 4/25 ETT and left IJ TLC   Interim History / Subjective:  Patient is critically ill remain hypotensive on 3 vasopressors including Levophed, vasopressin and angiotensin II Oxygen saturation improved overnight to 90s but on maximum ventilatory setting Remain anuric  Objective   Blood pressure 122/80, pulse (!) 107, temperature (!) 100.4 F (38 C), temperature source Axillary, resp. rate (!) 36, height 5\' 10"  (1.778 m), weight (!) 136.9 kg, SpO2 95 %.    Vent Mode: PRVC FiO2 (%):  [100 %] 100 % Set Rate:  [32 bmp-34 bmp] 34 bmp Vt Set:  [440 mL] 440 mL PEEP:  [18 cmH20] 18 cmH20 Plateau Pressure:  [30 cmH20-36 cmH20] 32 cmH20   Intake/Output Summary (Last 24 hours) at 10/20/2020 1150 Last data filed at 10/30/2020  1100 Gross per 24 hour  Intake 4663.93 ml  Output 0 ml  Net 4663.93 ml   Filed Weights   11/08/20 0432 11/09/20 0500 11/01/2020 0500  Weight: 126.7 kg 126.8 kg (!) 136.9 kg    Physical Examination: General: Acutely ill-appearing male, orally intubated HEENT: [place]/AT, +icteric sclera, PERRL, mucous membranes, no JVD Neuro:  Eyes closed, does not open, not following commands PULM:  Bilateral basal crackles, no wheezes or rhonchi  GI: Moderately distended, NT, sluggish bowel sounds Extremities: Bilateral symmetric 1+ LE edema noted. Skin: Warm/dry, diffuse erythema to chest, abdomen, BLE. Mild jaundice.  Labs/imaging that I have  personally reviewed:  Serum creatinine continues to rise Respiratory culture is growing Pseudomonas  Resolved Hospital Problem List:    Assessment & Plan:  Acute hypoxic/hypercapnic respiratory failure due to aspiration pneumonia Septic shock due to urinary tract infection and aspiration pneumonia with Pseudomonas Acute metabolic encephalopathy Continue lung protective ventilation, currently on FiO2 100% and PEEP of 18 Maintain oxygen saturation between 90-95% Continue Flolan for resistant hypoxemia Unable to prone due to hemodynamic instability Continue IV antibiotic with meropenem Follow-up urine and respiratory culture and sensitivities and adjust antibiotics accordingly Continue Precedex, minimize sedation otherwise  Alcohol induced cardiomyopathy History of CHF diagnosed 2020 with initial EF 35-40%; most recent Echo 07/2020 with EF 55-60%). Hypotensive on presentation to ED to 80s/40s. Event monitor in place. Trop/BNP unremarkable. Repeat Echo with normal EF Hold home Coreg  Abdominal pain and distention in the setting of liver disease Acute alcoholic hepatitis Bedside ultrasonography showed no  ascites Likely he has severe abdominal wall edema RUQ [place] with liver surface irregularity, no acute findings (EtOH, venous congestion) Continue PPI    COPD, without exacerbation Home regimen includes Dulera BID, Albuterol PRN, Flonase.  EtOH abuse now with EtOH withdwrawal Tobacco dependence History of significant EtOH abuse, recently cut back to 2 fifths/week; previously drank a fifth/day. Continue CIWA protocol Continue Precedex Continue nicotine prevent nicotine withdrawal  Acute renal failure: . High anion gap metabolic acidosis Patient remained anuric, serum creatinine continue to trend up Renal US showed no acute abnormalities Nephrology consult is appreciated Recommend starting CRRT   Best Practice:  Diet: NPO Pain/Anxiety/Delirium protocol (if indicated): Precedex VAP protocol (if indicated): Yes DVT prophylaxis: Subcutaneous Heparin and SCD GI prophylaxis: PPI Glucose control:  SSI No Central venous access: Yes needed Arterial line:  Yes Foley:  N/A Mobility:  bed rest  PT consulted: N/A Last date of multidisciplinary goals of care discussion [4/26] with patient's mother and brother at bedside, they decided to keep patient DNR and continue aggressive medical care Code Status: DNR Disposition: ICU   Total critical care time: 49 minutes  Performed by: Cheri Fowler   Critical care time was exclusive of separately billable procedures and treating other patients.   Critical care was necessary to treat or prevent imminent or life-threatening deterioration.   Critical care was time spent personally by me on the following activities: development of treatment plan with patient and/or surrogate as well as nursing, discussions with consultants, evaluation of patient's response to treatment, examination of patient, obtaining history from patient or surrogate, ordering and performing treatments and interventions, ordering and review of laboratory studies, ordering and review of radiographic studies, pulse oximetry and re-evaluation of patient's condition.   Cheri Fowler MD [facility] See Amion for  pager If no response to pager, please call [phone] until 7pm After 7pm, Please call E-link [phone]

## 2020-11-15 NOTE — Progress Notes (Signed)
MD Chand called due to patient's CBG being in the 30's again after starting the D10 infusion. This RN already gave D50 IV push per protocol. At this time the MD called back and gives verbal order for D20 infusion at same rate of 42mL/hr. Will change fluids and continue to monitor.

## 2020-11-15 NOTE — Consult Note (Signed)
[facility] CONSULT NOTE    Date: 2020-11-29                  Patient Name:  Jesus Fuller  MRN: [id number]  DOB: Oct 26, 1975  Age / Sex: 45 y.o., male         PCP: Nicolette Bang, DO                 Service Requesting Consult: PCCM                 Reason for Consult: AKI with oliguria             History of Present Illness: Patient is a 45 y.o. male with a PMHx of alcohol abuse, nonischemic cardiomyopathy, congestive heart failure, DVT (Eliquis), hypertension, asthma, who was admitted to [facility] on 11/07/2020 for evaluation of syncope.  The patient was severely hypotensive despite fluid resuscitation.  Note, he previously drank one  fifth a day however recently cut down to two fifths a week.  On 4/23, CIWAs were elevated and he required Precedex.  On 4/25, he was cardiopulmonary resuscitated and intubated.  Patient was remained oliguric therefore CCM consulted our service.   Medications: Outpatient medications: Medications Prior to Admission  Medication Sig Dispense Refill Last Dose  . acetaminophen (TYLENOL) 500 MG tablet Take 500-1,000 mg by mouth every 6 (six) hours as needed for headache.   Past Week at Unknown time  . albuterol (VENTOLIN HFA) 108 (90 Base) MCG/ACT inhaler INHALE 2 PUFFS INTO THE LUNGS EVERY SIX HOURS AS NEEDED FOR WHEEZING OR SHORTNESS OF BREATH. 18 g 0 11/01/2020  . calcium carbonate (TUMS - DOSED IN MG ELEMENTAL CALCIUM) 500 MG chewable tablet TAKE 1 TABLET (500 MG OF ELEMENTAL CALCIUM TOTAL) BY MOUTH THREE TIMES DAILY WITH MEALS FOR 7 DAYS. (Patient taking differently: Chew 2-3 tablets by mouth 4 (four) times daily.) 21 tablet 0 10/26/2020  . carvedilol (COREG) 12.5 MG tablet TAKE 1 TABLET (12.5 MG TOTAL) BY MOUTH TWO TIMES DAILY. (Patient taking differently: Take 12.5 mg by mouth 2 (two) times daily with a meal.) 180 tablet 0 10/21/2020 at 8-9am  . fluticasone (FLONASE) 50 MCG/ACT nasal spray PLACE 1 SPRAY INTO BOTH NOSTRILS TWO TIMES DAILY.  (Patient taking differently: Place 1 spray into both nostrils 2 (two) times daily.) 16 g 1 Past Week at Unknown time  . folic acid (FOLVITE) 1 MG tablet TAKE 1 TABLET (1 MG TOTAL) BY MOUTH DAILY. (Patient taking differently: Take 1 mg by mouth daily.) 90 tablet 0 10/16/2020  . gabapentin (NEURONTIN) 100 MG capsule TAKE 2 CAPSULES (200 MG TOTAL) BY MOUTH THREE TIMES DAILY. (Patient taking differently: Take 200 mg by mouth 3 (three) times daily.) 540 capsule 0 10/22/2020  . hydrocortisone (ANUSOL-HC) 2.5 % rectal cream PLACE RECTALLY TWO TIMES DAILY AS NEEDED FOR HEMORRHOIDS. (Patient taking differently: Place 1 application rectally 2 (two) times daily as needed for hemorrhoids or anal itching.) 30 g 0 unknown  . pantoprazole (PROTONIX) 40 MG tablet TAKE 1 TABLET (40 MG TOTAL) BY MOUTH DAILY AT 12 NOON. (Patient taking differently: Take 40 mg by mouth daily at 12 noon.) 90 tablet 0 10/18/2020  . potassium chloride SA (KLOR-CON) 20 MEQ tablet TAKE 2 TABLETS (40 MEQ TOTAL) BY MOUTH DAILY WHEN YOU TAKE TORSEMIDE 20 MG (Patient taking differently: Take 40 mEq by mouth daily.) 60 tablet 0 10/19/2020  . thiamine 100 MG tablet TAKE 1 TABLET (100 MG TOTAL) BY MOUTH DAILY. (Patient taking  differently: Take 100 mg by mouth daily.) 90 tablet 0 11/09/2020  . torsemide (DEMADEX) 20 MG tablet TAKE 1 TABLET (20 MG TOTAL) BY MOUTH DAILY. (Patient taking differently: Take 20 mg by mouth daily.) 90 tablet 3 10/28/2020  . acidophilus (RISAQUAD) CAPS capsule Take 1 capsule by mouth daily. (Patient not taking: No sig reported)   Not Taking at Unknown time  . albuterol (PROVENTIL) (2.5 MG/3ML) 0.083% nebulizer solution USE 1 NEB (2.5 MG TOTAL) BY NEBULIZATION EVERY TWO HOURS AS NEEDED FOR WHEEZING. (Patient not taking: No sig reported) 90 mL 0 Not Taking at Unknown time  . mometasone-formoterol (DULERA) 100-5 MCG/ACT AERO INHALE 2 PUFFS INTO THE LUNGS TWO TIMES DAILY. (Patient not taking: Reported on 11/08/2020) 13 g 0 Not Taking at  Unknown time  . Multiple Vitamins-Minerals (CERTAVITE/ANTIOXIDANTS) TABS TAKE 1 TABLET BY MOUTH FOR DAILY (Patient not taking: Reported on 11/08/2020) 90 tablet 0 Not Taking at Unknown time  . nicotine (NICODERM CQ - DOSED IN MG/24 HOURS) 21 mg/24hr patch PLACE 1 PATCH (21 MG TOTAL) ONTO THE SKIN DAILY. (Patient not taking: Reported on 11/08/2020) 28 patch 0 Not Taking at Unknown time  . potassium & sodium phosphates (PHOS-NAK) 280-160-250 MG PACK TAKE 2 PACKETS BY MOUTH 4 (FOUR) TIMES DAILY - BEFORE MEALS AND AT BEDTIME FOR 7 DAYS. (Patient not taking: No sig reported) 56 each 0 Not Taking at Unknown time    Current medications: Current Facility-Administered Medications  Medication Dose Route Frequency Provider Last Rate Last Admin  . 0.9 %  sodium chloride infusion  250 mL Intravenous Continuous Merlene Laughter F, NP      . albuterol (PROVENTIL) (2.5 MG/3ML) 0.083% nebulizer solution 2.5 mg  2.5 mg Nebulization Q3H PRN Anders Simmonds, MD      . angiotensin II acetate (GIAPREZA) 2,500 mcg in sodium chloride 0.9 % 500 mL (5 mcg/mL) infusion  1.25-40 ng/kg/min Intravenous Continuous Jacky Kindle, MD 60.9 mL/hr at 11/29/20 0700 40 ng/kg/min at 11/29/2020 0700  . calcium gluconate 2 g/ 100 mL sodium chloride IVPB  2 g Intravenous Once Jacky Kindle, MD      . chlorhexidine gluconate (MEDLINE KIT) (PERIDEX) 0.12 % solution 15 mL  15 mL Mouth Rinse BID Jacky Kindle, MD      . Chlorhexidine Gluconate Cloth 2 % PADS 6 each  6 each Topical Daily Candee Furbish, MD   6 each at 11/08/20 1039  . dexmedetomidine (PRECEDEX) 400 MCG/100ML (4 mcg/mL) infusion  0.4-1.2 mcg/kg/hr Intravenous Titrated Candee Furbish, MD 37.4 mL/hr at 11/29/2020 0700 1.2 mcg/kg/hr at 11/29/20 0700  . docusate (COLACE) 50 MG/5ML liquid 100 mg  100 mg Per Tube BID Jacky Kindle, MD      . docusate (COLACE) 50 MG/5ML liquid 100 mg  100 mg Per Tube BID PRN Sloan Leiter B, RPH      . epoprostenol (VELETRI) for inhalation 1.43m/50mL  (30,000 ng/mL)  50 ng/kg/min (Ideal) Inhalation Continuous CJacky Kindle MD 7.3 mL/hr at [id number] 50 ng/kg/min at 005/15/20220341  . fentaNYL (SUBLIMAZE) injection 50-200 mcg  50-200 mcg Intravenous Q30 min PRN CJacky Kindle MD   200 mcg at 015-May-[id number]  . folic acid injection 1 mg  1 mg Intravenous Daily DMerlene LaughterF, NP   1 mg at 11/09/20 0900  . guaiFENesin-dextromethorphan (ROBITUSSIN DM) 100-10 MG/5ML syrup 10 mL  10 mL Per Tube Q4H PRN MPriscella Mann RPH      . heparin injection 5,000 Units  5,000 Units Subcutaneous Q8H  Merlene Laughter F, NP   5,000 Units at 2020/11/26 0500  . hydrocortisone sodium succinate (SOLU-CORTEF) 100 MG injection 100 mg  100 mg Intravenous Q8H Jacky Kindle, MD   100 mg at 11/26/20 0454  . lactulose (CHRONULAC) 10 GM/15ML solution 20 g  20 g Per Tube Q4H Jacky Kindle, MD   20 g at Nov 26, 2020 0812  . magnesium sulfate IVPB 2 g 50 mL  2 g Intravenous Once Jacky Kindle, MD 50 mL/hr at 11/26/2020 0814 2 g at 11/26/2020 0814  . MEDLINE mouth rinse  15 mL Mouth Rinse 10 times per day Jacky Kindle, MD      . meropenem (MERREM) 1 g in sodium chloride 0.9 % 100 mL IVPB  1 g Intravenous Q12H Jacky Kindle, MD   Stopped at 11/09/20 2148  . multivitamin liquid 15 mL  15 mL Per Tube Daily Priscella Mann, RPH      . nicotine (NICODERM CQ - dosed in mg/24 hours) patch 21 mg  21 mg Transdermal Daily Anders Simmonds, MD   21 mg at 11/09/20 0900  . norepinephrine (LEVOPHED) 16 mg in 239m premix infusion  0-40 mcg/min Intravenous Titrated OFrederik Pear MD 35.6 mL/hr at 005/12/220815 38 mcg/min at 005-12-220815  . polyethylene glycol (MIRALAX / GLYCOLAX) packet 17 g  17 g Per Tube Daily Chand, Sudham, MD      . polyethylene glycol (MIRALAX / GLYCOLAX) packet 17 g  17 g Per Tube Daily PRN MPriscella Mann RPH      . potassium & sodium phosphates (PHOS-NAK) 280-160-250 MG packet 1 packet  1 packet Per Tube TID CJacky Kindle MD      . sodium chloride flush (NS) 0.9 %  injection 10-40 mL  10-40 mL Intracatheter Q12H CJacky Kindle MD   10 mL at 005-12-220214  . sodium chloride flush (NS) 0.9 % injection 10-40 mL  10-40 mL Intracatheter PRN CJacky Kindle MD      . thiamine tablet 100 mg  100 mg Per Tube Daily Millen, Jessica B, RPH       Or  . thiamine (B-1) injection 100 mg  100 mg Intravenous Daily MPriscella Mann RPH      . vasopressin (PITRESSIN) 20 Units in sodium chloride 0.9 % 100 mL infusion-*FOR SHOCK*  0-0.03 Units/min Intravenous Continuous CJacky Kindle MD 9 mL/hr at 012-May-20220700 0.03 Units/min at 012-May-20220700      Allergies: No Known Allergies    Past Medical History: Past Medical History:  Diagnosis Date  . Alcoholism /alcohol abuse   . Aortic atherosclerosis ([facility] [place]   . Asthma   . Chest pain with normal coronary angiography 10/2018  . Chronic combined systolic and diastolic CHF (congestive heart failure) ([facility]   . Fatty liver   . Hypertension    Takes no medicine  . Left leg DVT ([facility] 09/2019  . NICM (nonischemic cardiomyopathy) ([place]   . Tobacco abuse      Past Surgical History: Past Surgical History:  Procedure Laterality Date  . implantable loop recorder placement  05/25/2020   Medtronic Reveal LPemberwickmodel [id number](SN [id number]) implanted by Dr ARayann Hemanfor Alleviate HF study  . RIGHT/LEFT HEART CATH AND CORONARY ANGIOGRAPHY N/A 10/24/2018   Procedure: RIGHT/LEFT HEART CATH AND CORONARY ANGIOGRAPHY;  Surgeon: KDixie Dials MD;  [facility];  Service: Cardiovascular;  Laterality: N/A;     Family History: Family History  Problem Relation Age of Onset  . Cancer Mother   .  COPD Mother   . Diabetes Father      Social History: Social History   Socioeconomic History  . Marital status: Single    Spouse name: Not on file  . Number of children: Not on file  . Years of education: Not on file  . Highest education level: Not on file  Occupational History  . Not on file  Tobacco Use  . Smoking status:  Current Every Day Smoker    Packs/day: 1.50    Types: Cigarettes  . Smokeless tobacco: Never Used  Vaping Use  . Vaping Use: Never used  Substance and Sexual Activity  . Alcohol use: Yes    Comment: 2-3 liters/wk  . Drug use: No  . Sexual activity: Not on file  Other Topics Concern  . Not on file  Social History Narrative  . Not on file   Social Determinants of Health   Financial Resource Strain: Not on file  Food Insecurity: Not on file  Transportation Needs: No Transportation Needs  . Lack of Transportation (Medical): No  . Lack of Transportation (Non-Medical): No  Physical Activity: Not on file  Stress: Not on file  Social Connections: Not on file  Intimate Partner Violence: Not on file     Review of Systems: Unable to perform ROS as pt is sedated.   Vital Signs: Blood pressure 122/80, pulse (!) 109, temperature (!) 103.3 F (39.6 C), temperature source Oral, resp. rate (!) 35, height '5\' 10"'  (1.778 m), weight (!) 136.9 kg, SpO2 97 %.  Weight trends: Filed Weights   11/08/20 0432 11/09/20 0500 2020-11-18 0500  Weight: 126.7 kg 126.8 kg (!) 136.9 kg    Physical Exam: GEN:     Vital signs reviewed and noted.  Ill-appearing male on vent   HENT:    Normocephalic, atraumatic EYES:   pupils equal and reactive, scleral icterus, yellow dried discharge on eyelid RESP:   clear to auscultation anteriorly, ET-tube present CVS:    tachycardic, regular rhythm,  distal pulses intact   ABD:  soft,  bowel sounds present; distended abdomen, no fluid wave appreciated EXT:   atraumatic, woody edema in all extremities  NEURO:  sedated Skin:   warm and dry, jaundiced    Lab results: Basic Metabolic Panel: Recent Labs  Lab 11/07/20 0116 11/07/20 1210 11/08/20 0104 11/09/20 0120 11/09/20 1327 11/09/20 1346 11/09/20 1532 11-18-2020 0338 Nov 18, 2020 0734  NA 137   < > 139 140 141   < > 140 136 136  K 3.5   < > 3.3* 4.9 3.8   < > 3.9 4.6 4.5  CL 91*   < > 95* 96* 98  --   --   96*  --   CO2 28   < > '31 29 31  ' --   --  21*  --   GLUCOSE 144*   < > 121* 86 88  --   --  64*  --   BUN 7   < > 12 21* 23*  --   --  28*  --   CREATININE 3.15*   < > 3.39* 4.84* 5.85*  --   --  6.90*  --   CALCIUM 8.9   < > 8.2* 7.4* 7.5*  --   --  7.2*  --   MG 1.1*  --  2.1 2.1  --   --   --  1.6*  --   PHOS <1.0*   < > 3.7 2.5  --   --   --  1.4*  --    < > = values in this interval not displayed.    Liver Function Tests: Recent Labs  Lab 11/07/20 0116 11/09/20 1327 11-18-2020 0338  AST 160* 182* 281*  ALT 140* 92* 108*  ALKPHOS 200* 138* 90  BILITOT 10.0* 12.3* 15.6*  PROT 6.0* 5.1* 5.4*  ALBUMIN 3.2* 2.5* 2.6*   Recent Labs  Lab 10/18/2020 1700  LIPASE 26   Recent Labs  Lab 10/31/2020 1700  AMMONIA 52*    CBC: Recent Labs  Lab 11/04/2020 1432 10/21/2020 1452 11/01/2020 1700 11/07/20 0116 11/09/20 0120 11/09/20 1327 11/09/20 1346 11/09/20 1532 18-Nov-2020 0338 11/18/20 0734  WBC 8.1  --  6.3 8.0 7.0 6.1  --   --  6.2  --   NEUTROABS 5.9  --   --   --   --   --   --   --   --   --   HGB 16.7   < > 15.9 15.4 15.2 14.8   < > 17.3* 15.8 17.7*  HCT 47.2   < > 47.1 45.4 48.7 49.3   < > 51.0 51.4 52.0  MCV 108.8*  --  110.6* 110.7* 120.5* 124.8*  --   --  121.8*  --   PLT PLATELET CLUMPS NOTED ON SMEAR, UNABLE TO ESTIMATE  --  86* 90* 84* 99*  --   --  105*  --    < > = values in this interval not displayed.    Cardiac Enzymes: Recent Labs  Lab 11/09/20 0914  CKTOTAL 322  CKMB 8.2*    BNP: Invalid input(s): POCBNP  CBG: Recent Labs  Lab 11/09/20 2314 11-18-2020 0343 2020/11/18 0344 2020/11/18 0753 11-18-2020 0759  GLUCAP 83 66* 70 30* 50*    Microbiology: Results for orders placed or performed during the hospital encounter of 10/22/2020  Culture, blood (routine x 2)     Status: None (Preliminary result)   Collection Time: 10/16/2020  2:47 PM   Specimen: BLOOD  Result Value Ref Range Status   Specimen Description BLOOD SITE NOT SPECIFIED  Final   Special  Requests   Final    BOTTLES DRAWN AEROBIC AND ANAEROBIC Blood Culture adequate volume   Culture   Final    NO GROWTH 4 DAYS Performed at [facility], [place] [address] [place], [place] [address]    Report Status PENDING  Incomplete  Resp Panel by RT-PCR (Flu A&B, Covid) Nasopharyngeal Swab     Status: None   Collection Time: 10/21/2020  2:54 PM   Specimen: Nasopharyngeal Swab; Nasopharyngeal(NP) swabs in vial transport medium  Result Value Ref Range Status   SARS Coronavirus 2 by RT PCR NEGATIVE NEGATIVE Final    Comment: (NOTE) SARS-CoV-2 target nucleic acids are NOT DETECTED.  The SARS-CoV-2 RNA is generally detectable in upper respiratory specimens during the acute phase of infection. The lowest concentration of SARS-CoV-2 viral copies this assay can detect is 138 copies/mL. A negative result does not preclude SARS-Cov-2 infection and should not be used as the sole basis for treatment or other patient management decisions. A negative result may occur with  improper specimen collection/handling, submission of specimen other than nasopharyngeal swab, presence of viral mutation(s) within the areas targeted by this assay, and inadequate number of viral copies(<138 copies/mL). A negative result must be combined with clinical observations, patient history, and epidemiological information. The expected result is Negative.  Fact Sheet for Patients:  EntrepreneurPulse.com.au  Fact Sheet for Healthcare Providers:  IncredibleEmployment.be  This test is no t yet approved or cleared by the [place] and  has been authorized for detection and/or diagnosis of SARS-CoV-2 by FDA under an Emergency Use Authorization (EUA). This EUA will remain  in effect (meaning this test can be used) for the duration of the COVID-19 declaration under Section 564(b)(1) of the Act, 21 U.S.C.section 360bbb-3(b)(1), unless the authorization is terminated  or  revoked sooner.       Influenza A by PCR NEGATIVE NEGATIVE Final   Influenza B by PCR NEGATIVE NEGATIVE Final    Comment: (NOTE) The Xpert Xpress SARS-CoV-2/FLU/RSV plus assay is intended as an aid in the diagnosis of influenza from Nasopharyngeal swab specimens and should not be used as a sole basis for treatment. Nasal washings and aspirates are unacceptable for Xpert Xpress SARS-CoV-2/FLU/RSV testing.  Fact Sheet for Patients: EntrepreneurPulse.com.au  Fact Sheet for Healthcare Providers: IncredibleEmployment.be  This test is not yet approved or cleared by the [place] FDA and has been authorized for detection and/or diagnosis of SARS-CoV-2 by FDA under an Emergency Use Authorization (EUA). This EUA will remain in effect (meaning this test can be used) for the duration of the COVID-19 declaration under Section 564(b)(1) of the Act, 21 U.S.C. section 360bbb-3(b)(1), unless the authorization is terminated or revoked.  Performed at [facility], [place] [address] [place], [place] [address]   MRSA PCR Screening     Status: None   Collection Time: 11/13/2020  9:44 PM   Specimen: Nasal Mucosa; Nasopharyngeal  Result Value Ref Range Status   MRSA by PCR NEGATIVE NEGATIVE Final    Comment:        The GeneXpert MRSA Assay (FDA approved for NASAL specimens only), is one component of a comprehensive MRSA colonization surveillance program. It is not intended to diagnose MRSA infection nor to guide or monitor treatment for MRSA infections. Performed at [facility], [place] [address], [place], [place] [address]   Culture, blood (routine x 2)     Status: None (Preliminary result)   Collection Time: 10/26/2020 10:07 PM   Specimen: BLOOD RIGHT HAND  Result Value Ref Range Status   Specimen Description BLOOD RIGHT HAND  Final   Special Requests AEROBIC BOTTLE ONLY Blood Culture adequate volume  Final   Culture   Final    NO GROWTH 3  DAYS Performed at [facility], [place] [address], [place], [place] [address]    Report Status PENDING  Incomplete  Culture, Respiratory w Gram Stain     Status: None (Preliminary result)   Collection Time: 11/09/20 12:33 PM   Specimen: Tracheal Aspirate; Respiratory  Result Value Ref Range Status   Specimen Description TRACHEAL ASPIRATE  Final   Special Requests NONE  Final   Gram Stain   Final    MODERATE WBC PRESENT, PREDOMINANTLY PMN ABUNDANT GRAM NEGATIVE RODS FEW GRAM POSITIVE COCCI    Culture   Final    CULTURE REINCUBATED FOR BETTER GROWTH Performed at [facility], [place] [address], [place], [place] [address]    Report Status PENDING  Incomplete    Coagulation Studies: No results for input(s): LABPROT, INR in the last 72 hours.  Urinalysis: Recent Labs    11/09/20 0928  COLORURINE AMBER*  LABSPEC >1.030*  PHURINE 6.5  GLUCOSEU 100*  HGBUR TRACE*  BILIRUBINUR LARGE*  KETONESUR NEGATIVE  PROTEINUR 100*  NITRITE POSITIVE*  LEUKOCYTESUR NEGATIVE      Imaging: DG Abd 1  View  Result Date: 11/09/2020 CLINICAL DATA:  Status post NG tube placement. EXAM: ABDOMEN - 1 VIEW COMPARISON:  None. FINDINGS: NG tube is in place with the side port in the distal esophagus. Recommend advancement of approximately 11 cm. IMPRESSION: As above. Electronically Signed   By: Inge Rise M.D.   On: 11/09/2020 12:05   DG CHEST PORT 1 VIEW  Result Date: 11/09/2020 CLINICAL DATA:  STATUS POST INTUBATION. EXAM: PORTABLE CHEST 1 VIEW COMPARISON:  10/21/2020 FINDINGS: Endotracheal tube tip is above the carina. NG tube tip is below the expected location of the GE junction. Left IJ catheter tip is obscured by overlying cardiac leads loop recorder is noted in the projection of the left midlung. Stable cardiomediastinal contours. There is diffuse retrocardiac opacification of the left lower lobe compatible with atelectasis and or airspace disease. New bilateral pleural effusions.  IMPRESSION: 1. Support apparatus positioned as above 2. New retrocardiac opacification of the left lower lobe compatible with airspace consolidation, atelectasis and/or effusion. 3. Suspect bilateral pleural effusions. Electronically Signed   By: Kerby Moors M.D.   On: 11/09/2020 12:09      Assessment & Plan: Pt is a 45 y.o. yo male with a PMHX of hypertension, nonischemic cardiomyopathy, congestive heart failure, asthma, and alcohol abuse, was admitted to [facility] on 10/16/2020 with shock.    Acute Renal Failure:  Renal ultrasound without obstruction and was unremarkable.  Serum creatine baseline appears to be 1-1.2. On admission Cr 2.72. His creatine has trended up since admission. Today Cr. 6.9. He is hypervolemic. Yesterdays UA with evidence of pyuria, proteinuria, hematuria (not supported by microscopy). No urinary sediments.   On admission, he likely had hepatorenal syndrome. He was sustained hypoxia and underwent CPR yesterday. Suspect ATN. Patient was oliguric (75-250cc) in the past couple of day. Despite 2L fluid bolus yesterday he is anuric. Spoke with pt's mom, Ms. Brotherton, regarding patient's kidney function and volume status. Discussed risks and benefits of CRRT. She states "he is hanging on" and would like CRRT if it could help. Will order CRRT.  - Avoid nephrotoxic agents: hold ACEi/ARBs  - strict Is/Os - Renally dose medications  - Continue bladder scans - Daily RFP  Hypophosphatemia - Phosphorus 1.4. Continue Phos-NaK.  Hypomagnesemia - Mg 1.6, replete as needed.  Septic Shock  Aspiration Pneumonia - per primary - on vasopressin, Angiotensin II, Levophed. Was on Zosyn. De-escalate angiotensin when able.    EtOH abuse with acute withdrawal  Alcohol Hepitatis- on pressors as above, Precedex  NICM with biventricular CHF - Normal EF this admission. Monitor volume status closely. Begin CRRT to euvolemic. Tobacco abuse - per primary  Prior DVT - heparin, plan per primary  HTN -  patient hypotensive despite multiple pressors. Plan per primary   COPD - per primary GOC: Family agreed to DNR yesterday.    DVT PPX - SQ Heparin   Lyndee Hensen, DO 2020/11/30  8:24 AM

## 2020-11-15 NOTE — Progress Notes (Signed)
MD paged 3 times about arterial line not reading and not drawing blood. The A-line was placed by Dr. Merrily Pew 11/09/2020 and has been working properly up until this point.   MD returned the page at 1639 and asked that the A-line be removed by RN and respiratory attempt to get a new A-line because the patient's BP cuff on left wrist may not be accurate. At this time the MD was also notified of the patient's O2 saturation being between 86-88 on 100% FiO2. No new orders for O2 saturation at this time.

## 2020-11-15 NOTE — Progress Notes (Signed)
eLink Physician-Brief Progress Note Patient Name: Jesus Fuller DOB: 1975-09-19 MRN: [id number]   Date of Service  10/18/2020  HPI/Events of Note  Standard Norepinephrine disicontinued and replaced with quad strength   eICU Interventions  See above.        Thomasene Lot Broden Holt 11/09/2020, 1:17 AM

## 2020-11-15 DEATH — deceased

## 2020-12-03 MED FILL — Medication: Qty: 1 | Status: AC

## 2021-03-11 IMAGING — CR LEFT HAND - COMPLETE 3+ VIEW
3 series · 3 of 3 positions shown · non-contrast
Comparison: 01/26/2013

CLINICAL DATA: Hand pain and swelling

EXAM:
LEFT HAND - COMPLETE 3+ VIEW

[x hand pa left]
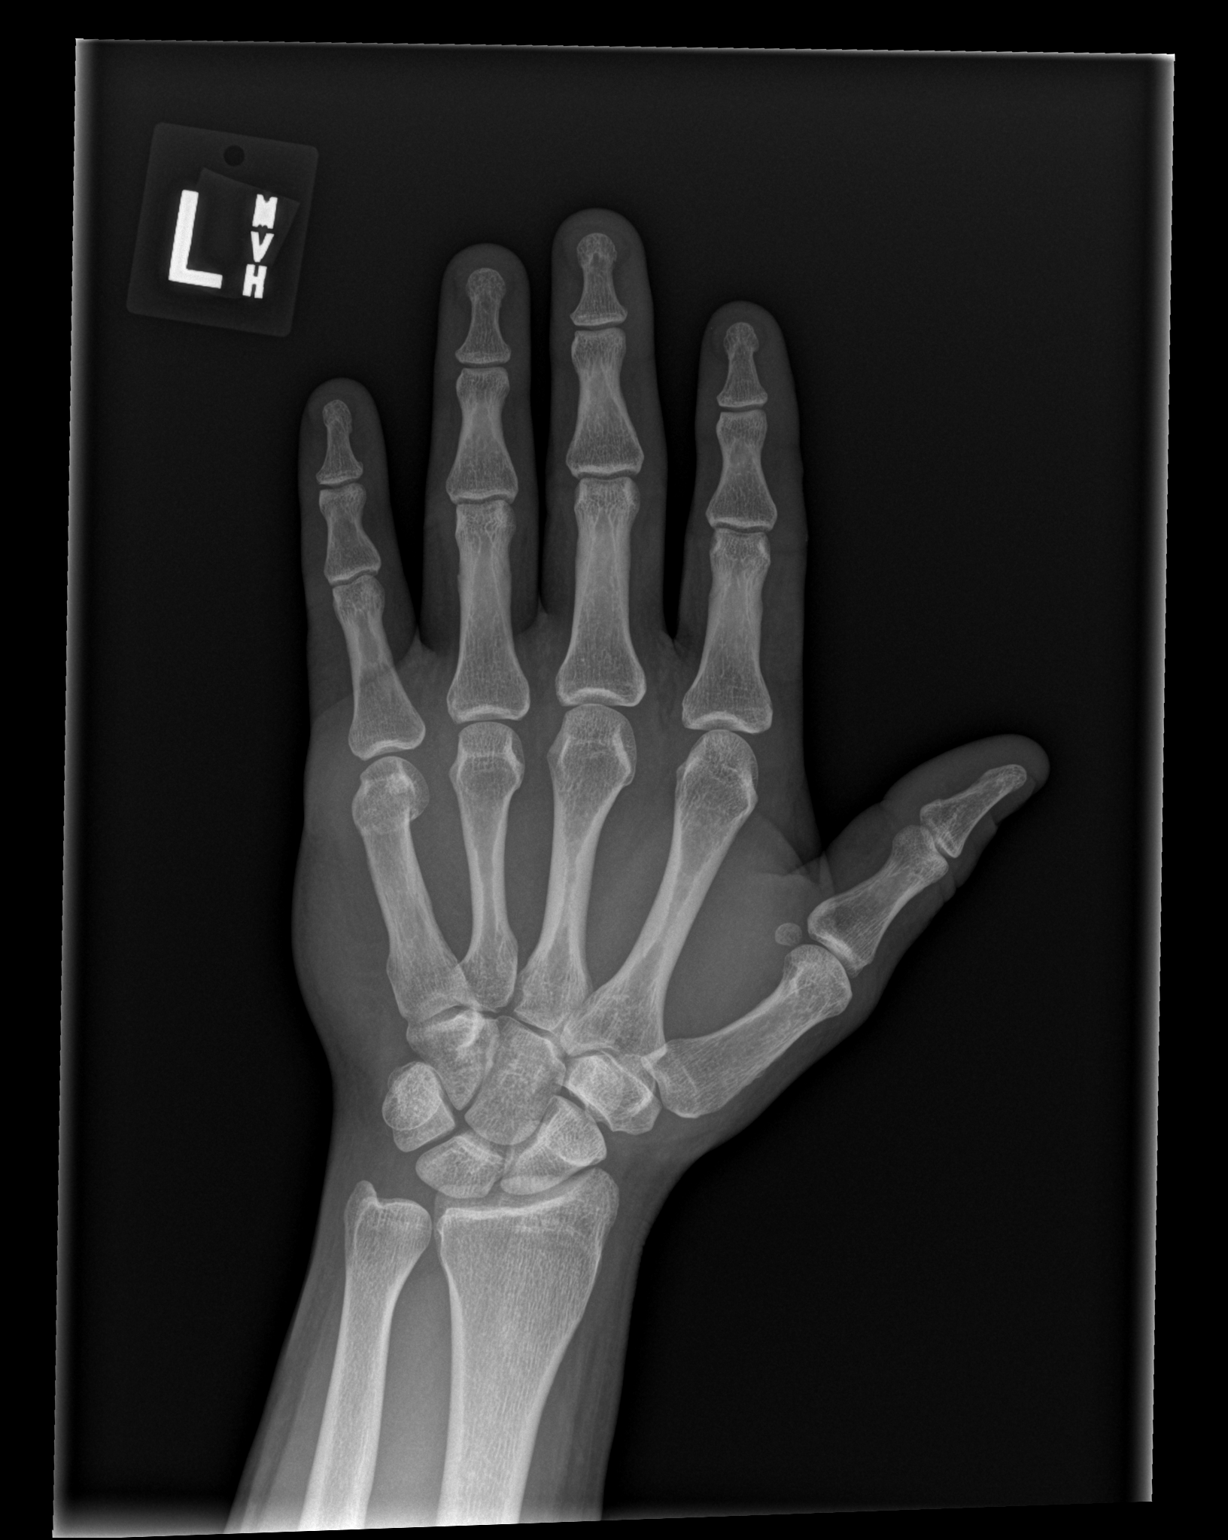

[x hand obl left]
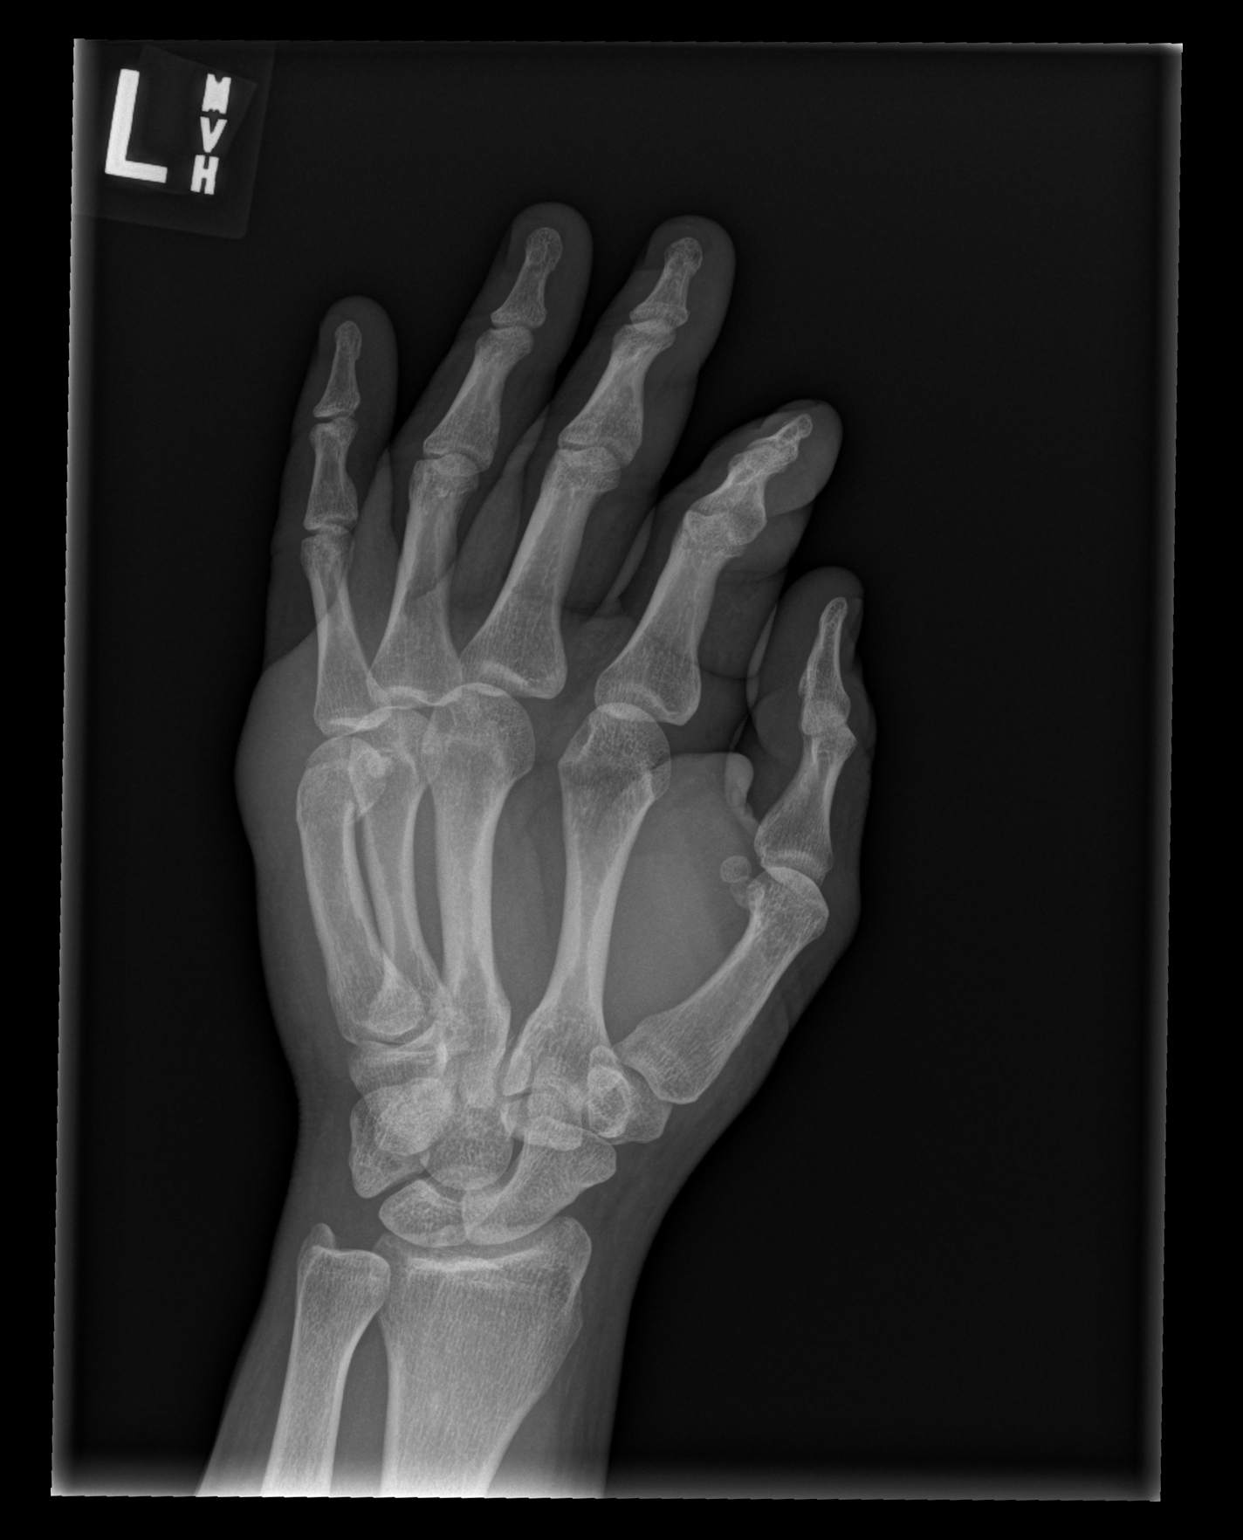

[x hand lat left]
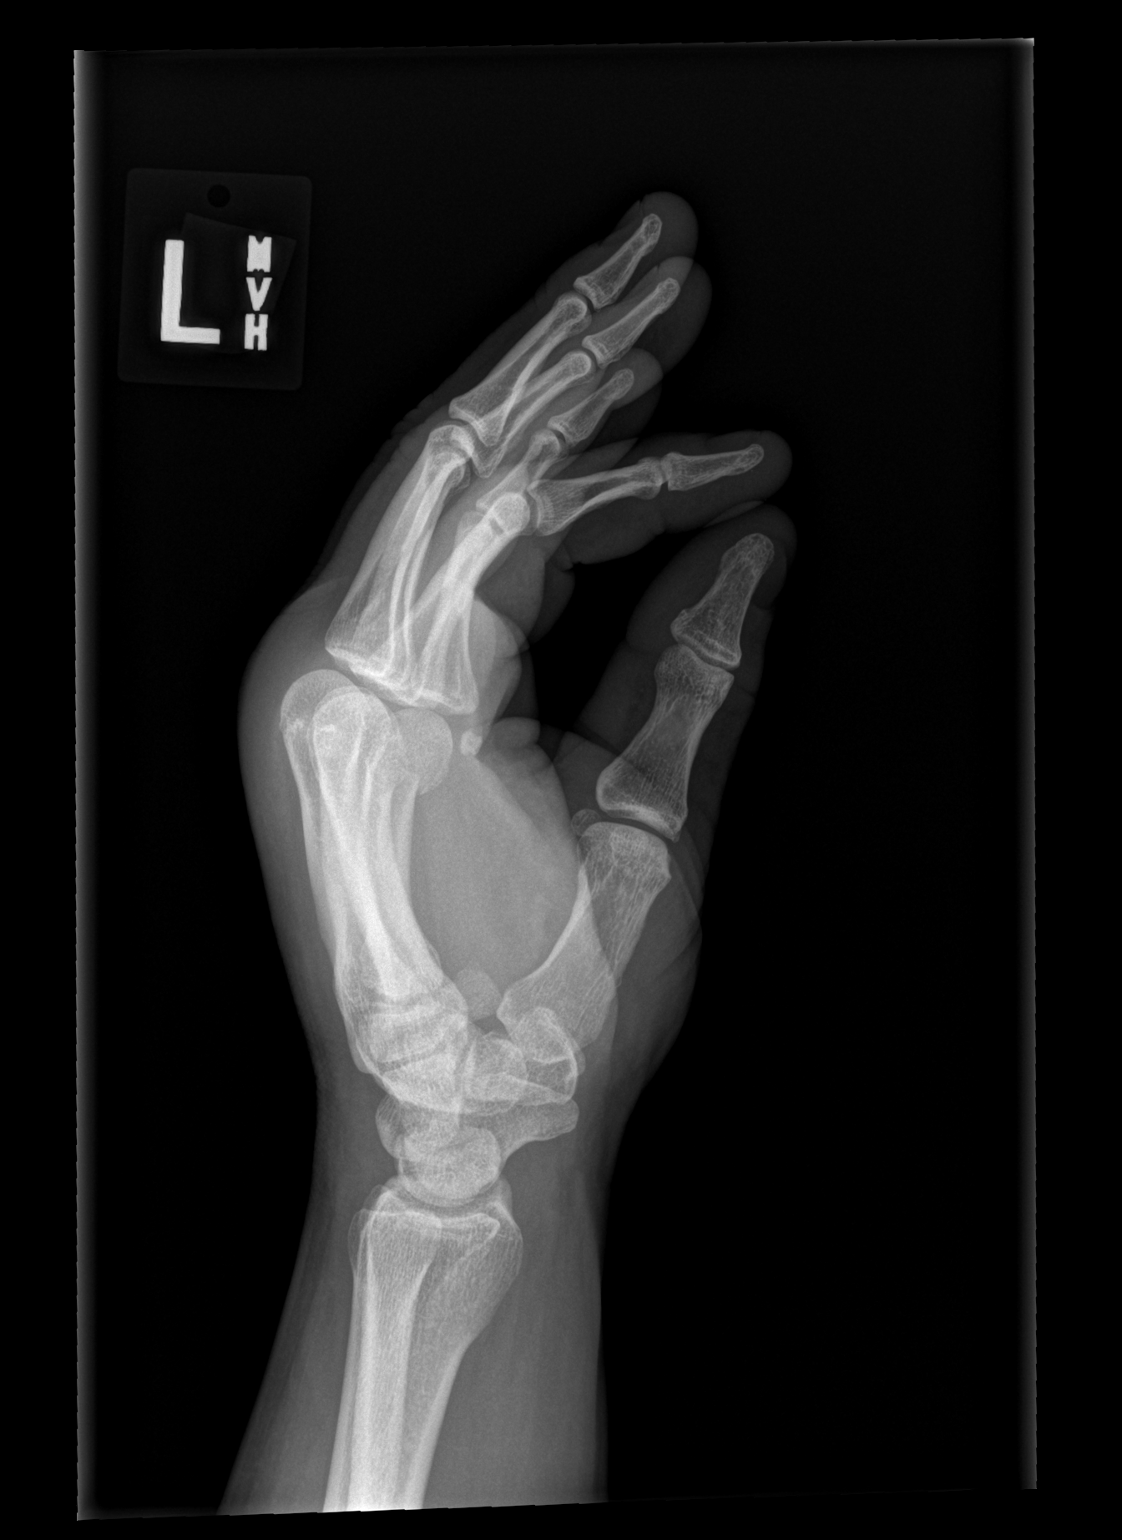

[3 of 3 positions shown; findings below may reference images not displayed]

FINDINGS: There is an acute fracture of the fifth metacarpal neck with mild
dorsal apex angulation. Soft tissue swelling overlies the fracture
site. No additional fractures are identified. No dislocation. Joint
spaces are preserved. No radiopaque foreign body.
IMPRESSION: Acute fifth metacarpal neck fracture with mild dorsal apex
angulation.
